# Patient Record
Sex: Female | Born: 1955
Health system: Southern US, Community
[De-identification: ages and names within clinical notes are randomized; demographics above are authoritative.]

## PROBLEM LIST (undated history)

## (undated) DIAGNOSIS — E039 Hypothyroidism, unspecified: Secondary | ICD-10-CM

## (undated) DIAGNOSIS — M810 Age-related osteoporosis without current pathological fracture: Secondary | ICD-10-CM

## (undated) DIAGNOSIS — K219 Gastro-esophageal reflux disease without esophagitis: Secondary | ICD-10-CM

## (undated) DIAGNOSIS — J45909 Unspecified asthma, uncomplicated: Secondary | ICD-10-CM

## (undated) DIAGNOSIS — K589 Irritable bowel syndrome without diarrhea: Secondary | ICD-10-CM

## (undated) DIAGNOSIS — J849 Interstitial pulmonary disease, unspecified: Secondary | ICD-10-CM

## (undated) DIAGNOSIS — M3500 Sicca syndrome, unspecified: Secondary | ICD-10-CM

## (undated) DIAGNOSIS — M329 Systemic lupus erythematosus, unspecified: Secondary | ICD-10-CM

## (undated) DIAGNOSIS — R87619 Unspecified abnormal cytological findings in specimens from cervix uteri: Secondary | ICD-10-CM

## (undated) DIAGNOSIS — E0500 Thyrotoxicosis with diffuse goiter without thyrotoxic crisis or storm: Secondary | ICD-10-CM

## (undated) DIAGNOSIS — R519 Headache, unspecified: Secondary | ICD-10-CM

## (undated) DIAGNOSIS — Z6830 Body mass index (BMI) 30.0-30.9, adult: Secondary | ICD-10-CM

## (undated) DIAGNOSIS — E785 Hyperlipidemia, unspecified: Secondary | ICD-10-CM

## (undated) DIAGNOSIS — I7300 Raynaud's syndrome without gangrene: Secondary | ICD-10-CM

## (undated) DIAGNOSIS — K900 Celiac disease: Secondary | ICD-10-CM

## (undated) DIAGNOSIS — K551 Chronic vascular disorders of intestine: Secondary | ICD-10-CM

## (undated) DIAGNOSIS — M797 Fibromyalgia: Secondary | ICD-10-CM

## (undated) DIAGNOSIS — E669 Obesity, unspecified: Secondary | ICD-10-CM

## (undated) DIAGNOSIS — F419 Anxiety disorder, unspecified: Secondary | ICD-10-CM

## (undated) DIAGNOSIS — I10 Essential (primary) hypertension: Secondary | ICD-10-CM

## (undated) DIAGNOSIS — T7840XA Allergy, unspecified, initial encounter: Secondary | ICD-10-CM

## (undated) HISTORY — PX: OTHER SURGICAL HISTORY: SHX169

## (undated) HISTORY — DX: Interstitial pulmonary disease, unspecified: J84.9

## (undated) HISTORY — DX: Allergy, unspecified, initial encounter: T78.40XA

## (undated) HISTORY — PX: REDUCTION MAMMAPLASTY: SUR839

## (undated) HISTORY — DX: Thyrotoxicosis with diffuse goiter without thyrotoxic crisis or storm: E05.00

## (undated) HISTORY — PX: EYE SURGERY: SHX253

## (undated) HISTORY — DX: Systemic lupus erythematosus, unspecified: M32.9

## (undated) HISTORY — DX: Sjogren syndrome, unspecified: M35.00

## (undated) HISTORY — DX: Raynaud's syndrome without gangrene: I73.00

## (undated) HISTORY — DX: Gastro-esophageal reflux disease without esophagitis: K21.9

## (undated) HISTORY — PX: APPENDECTOMY: SHX54

## (undated) HISTORY — PX: CHOLECYSTECTOMY: SHX55

## (undated) HISTORY — PX: DG THUMB RIGHT HAND (ARMC HX): HXRAD1825

---

## 2018-04-06 ENCOUNTER — Ambulatory Visit (HOSPITAL_COMMUNITY): Payer: Worker's Compensation | Attending: Family Medicine | Admitting: Physical Therapy

## 2018-04-06 DIAGNOSIS — M5416 Radiculopathy, lumbar region: Secondary | ICD-10-CM | POA: Diagnosis not present

## 2018-04-06 DIAGNOSIS — X58XXXA Exposure to other specified factors, initial encounter: Secondary | ICD-10-CM | POA: Insufficient documentation

## 2018-04-06 DIAGNOSIS — S39012A Strain of muscle, fascia and tendon of lower back, initial encounter: Secondary | ICD-10-CM | POA: Diagnosis not present

## 2018-04-06 DIAGNOSIS — M25651 Stiffness of right hip, not elsewhere classified: Secondary | ICD-10-CM | POA: Diagnosis not present

## 2018-04-06 DIAGNOSIS — M25652 Stiffness of left hip, not elsewhere classified: Secondary | ICD-10-CM | POA: Insufficient documentation

## 2018-04-06 DIAGNOSIS — S29012A Strain of muscle and tendon of back wall of thorax, initial encounter: Secondary | ICD-10-CM | POA: Diagnosis not present
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
## 2018-04-07 ENCOUNTER — Ambulatory Visit (HOSPITAL_COMMUNITY): Payer: Worker's Compensation

## 2018-04-07 ENCOUNTER — Encounter (HOSPITAL_COMMUNITY): Payer: Self-pay

## 2018-04-08 ENCOUNTER — Encounter (HOSPITAL_COMMUNITY): Payer: Self-pay | Admitting: Physical Therapy

## 2018-04-13 ENCOUNTER — Telehealth (HOSPITAL_COMMUNITY): Payer: Self-pay | Admitting: *Deleted

## 2018-04-13 NOTE — Telephone Encounter (Signed)
04/13/18  I called patient and gave her the appts listed and she was fine with all of them.

## 2018-04-19 ENCOUNTER — Ambulatory Visit (HOSPITAL_COMMUNITY): Payer: Worker's Compensation | Attending: Physical Therapy | Admitting: Physical Therapy

## 2018-04-19 ENCOUNTER — Encounter (HOSPITAL_COMMUNITY): Payer: Self-pay | Admitting: Physical Therapy

## 2018-04-19 DIAGNOSIS — M25652 Stiffness of left hip, not elsewhere classified: Secondary | ICD-10-CM | POA: Insufficient documentation

## 2018-04-19 DIAGNOSIS — M25651 Stiffness of right hip, not elsewhere classified: Secondary | ICD-10-CM | POA: Insufficient documentation

## 2018-04-19 DIAGNOSIS — M5416 Radiculopathy, lumbar region: Secondary | ICD-10-CM | POA: Insufficient documentation

## 2018-04-19 NOTE — Addendum Note (Signed)
Addended by: Bella Kennedy on: 04/19/2018 04:04 PM   Modules accepted: Orders

## 2018-04-19 NOTE — Therapy (Signed)
[facility] [address] [place], [place], [address] Phone: [phone]   Fax:  [phone]  Physical Therapy Treatment  Patient Details  Name: Katie Davis MRN: [id number] Date of Birth: 1955/12/03 Referring Provider (PT): Dayna Barker   Encounter Date: 04/19/2018  PT End of Session - 04/19/18 1206    Visit Number  2    Number of Visits  8    Date for PT Re-Evaluation  05/06/18    Authorization - Visit Number  2    Authorization - Number of Visits  8    PT Start Time  1036    PT Stop Time  1120    PT Time Calculation (min)  44 min    Activity Tolerance  Patient tolerated treatment well    Behavior During Therapy  [facility] for tasks assessed/performed       History reviewed. No pertinent past medical history.  History reviewed. No pertinent surgical history.  There were no vitals filed for this visit.  Subjective Assessment - 04/19/18 1035    Subjective  Pt states she started an aquatic program at the [facility] and has been doing her HEP and walking at home.  Currently 6/10 LBP.  States her thyroid issues are continuing as well as other issues she's battling.     Currently in Pain?  Yes    Pain Score  6     Pain Location  Back    Pain Orientation  Lower    Pain Descriptors / Indicators  Throbbing;Stabbing    Pain Type  Chronic pain                       [facility] Adult PT Treatment/Exercise - 04/19/18 0001      Lumbar Exercises: Stretches   Active Hamstring Stretch  Right;Left;2 reps;30 seconds    Active Hamstring Stretch Limitations  standing 12" step, supine with towel      Lumbar Exercises: Standing   Heel Raises  10 reps;Limitations    Heel Raises Limitations  toeraises 10 reps    Forward Lunge  10 reps;Limitations    Forward Lunge Limitations  4" step no UE's    Other Standing Lumbar Exercises  lumbar excursions 3 way 10 reps each       Lumbar Exercises: Seated   Other Seated Lumbar Exercises  sit tall with  scapular retraction, transverse abdominal activation x 10 .      Lumbar Exercises: Supine   Other Supine Lumbar Exercises  decompression 1-5,  5 reps each             PT Education - 04/19/18 1213    Education Details  reviewed goals, HEP and poc moving forward.  Spoke with aquatic therapist and educated on decompressoin therex    Person(s) Educated  Patient    Methods  Explanation    Comprehension  Verbalized understanding       PT Short Term Goals - 04/19/18 1214      PT SHORT TERM GOAL #1   Title  Pt to state that she is walking 10 minutes a day.    Time  2    Period  Weeks    Status  Achieved      PT SHORT TERM GOAL #2   Title  PT LE strength to increase to allow pt to be able to go up and down 5 steps in a reciprocal manner without increased pain.     Time  2    Period  Weeks    Status  On-going      PT SHORT TERM GOAL #3   Title  Pt to be able to put dishes and groceries up to shoulder or higher cabinets without increased pain .     Time  2    Period  Weeks    Status  On-going        PT Long Term Goals - 04/19/18 1214      PT LONG TERM GOAL #1   Title  Pt to be walking 20 minutes a day.    Time  4    Period  Weeks    Status  On-going      PT LONG TERM GOAL #2   Title  PT hip and back ROM to be improved to allow pt to pick up items off the floor with ease     Time  4    Period  Weeks    Status  On-going      PT LONG TERM GOAL #3   Title  Pt to be I in an advance exercise program to continue improving balance and core strength to be able to complete yard and housework without increased pain.     Time  4    Period  Weeks    Status  On-going            Plan - 04/19/18 1207    Clinical Impression Statement  Pt voiced interest in completing aquatics; discussed with Fleet Contras and patient put on waiting list.  REviewed goals, HEP and POC moving forward.   Began lumbar excursions, LE standing exercises and decompression exercises this session.  Pt able  to complete in overall good form and minimal cues.      Rehab Potential  Good    PT Frequency  2x / week    PT Duration  4 weeks    PT Treatment/Interventions  ADLs/Self Care Home Management;Functional mobility training;Therapeutic activities;Therapeutic exercise;Balance training;Neuromuscular re-education;Patient/family education;Manual techniques    PT Next Visit Plan  Next session begin thoracic excursions and postural theraband exercises.  Add reciprocal steps and begin to work on Actuary and upper cabinets progressing to lifting floor to waist.  Progress to decompression exercises with theraband.   Follow up on aquatics in a couple weeks for vacancies.  Initiate manual as needed to reduce pain.     PT Home Exercise Plan  tall sitting, scapular retraction and transverse abdominal activation.     Consulted and Agree with Plan of Care  Patient       Patient will benefit from skilled therapeutic intervention in order to improve the following deficits and impairments:  Decreased activity tolerance, Decreased balance, Decreased strength, Difficulty walking, Decreased range of motion, Pain  Visit Diagnosis: Stiffness of right hip, not elsewhere classified  Stiffness of left hip, not elsewhere classified  Radiculopathy, lumbar region     Problem List There are no active problems to display for this patient.  Lurena Nida, PTA/CLT [phone]  Lurena Nida 04/19/2018, 12:15 PM  [facility] [address] [place], [place], [address] Phone: [phone]   Fax:  [phone]  Name: Katie Davis MRN: [id number] Date of Birth: 05-03-1955

## 2018-04-21 ENCOUNTER — Ambulatory Visit (HOSPITAL_COMMUNITY): Payer: Worker's Compensation

## 2018-04-22 ENCOUNTER — Ambulatory Visit (HOSPITAL_COMMUNITY): Payer: Worker's Compensation | Admitting: Physical Therapy

## 2018-04-22 ENCOUNTER — Encounter (HOSPITAL_COMMUNITY): Payer: Self-pay | Admitting: Physical Therapy

## 2018-04-22 DIAGNOSIS — M25652 Stiffness of left hip, not elsewhere classified: Secondary | ICD-10-CM

## 2018-04-22 DIAGNOSIS — M5416 Radiculopathy, lumbar region: Secondary | ICD-10-CM | POA: Diagnosis not present

## 2018-04-22 DIAGNOSIS — M25651 Stiffness of right hip, not elsewhere classified: Secondary | ICD-10-CM

## 2018-04-22 NOTE — Therapy (Addendum)
[facility] [address] [place], [place], [address] Phone: [phone]   Fax:  [phone]  Physical Therapy Treatment  Patient Details  Name: Katie Davis MRN: [id number] Date of Birth: 11/07/1955 Referring Provider (PT): Dayna BarkerLee Mancini   Encounter Date: 04/22/2018  PT End of Session - 04/22/18 0851    Visit Number  3    Number of Visits  8    Date for PT Re-Evaluation  05/06/18    Authorization - Visit Number  3    Authorization - Number of Visits  8    PT Start Time  0820    PT Stop Time  0900    PT Time Calculation (min)  40 min    Activity Tolerance  Patient tolerated treatment well    Behavior During Therapy  [facility] for tasks assessed/performed       History reviewed. No pertinent past medical history.  History reviewed. No pertinent surgical history.  There were no vitals filed for this visit.  Subjective Assessment - 04/22/18 0825    Subjective  Pt states that she is doing her exercises at home.  She has no questions     Currently in Pain?  Yes    Pain Score  5     Pain Location  Back    Pain Orientation  Lower    Pain Descriptors / Indicators  Aching                       [facility] Adult PT Treatment/Exercise - 04/22/18 0001      Ambulation/Gait   Ambulation/Gait  Yes    Gait Comments  x4 minutes       Posture/Postural Control   Posture/Postural Control  Postural limitations    Postural Limitations  Forward head;Decreased lumbar lordosis      Exercises   Exercises  Lumbar      Lumbar Exercises: Stretches   Active Hamstring Stretch  Right;Left;3 reps;30 seconds    Single Knee to Chest Stretch  Right;Left;3 reps;30 seconds    Other Lumbar Stretch Exercise  thoracic and lumbar excursion x 3       Lumbar Exercises: Standing   Heel Raises  10 reps;Limitations    Functional Squats  10 reps    Forward Lunge  10 reps;Limitations    Forward Lunge Limitations  --    Scapular Retraction   Strengthening;Both;10 reps;Theraband    Theraband Level (Scapular Retraction)  Level 2 (Red)    Row  Strengthening;Both;10 reps;Theraband    Theraband Level (Row)  Level 2 (Red)    Shoulder Extension  Strengthening;10 reps;Theraband    Theraband Level (Shoulder Extension)  Level 2 (Red)      Lumbar Exercises: Supine   Bridge  5 reps    Bridge Limitations  10"      Lumbar Exercises: Sidelying   Hip Abduction  10 reps      vector stances 5" x 3 B          PT Short Term Goals - 04/22/18 0853      PT SHORT TERM GOAL #1   Title  Pt to state that she is walking 10 minutes a day.    Time  2    Period  Weeks    Status  Achieved      PT SHORT TERM GOAL #2   Title  PT LE strength to increase to allow pt to be able to go up and down  5 steps in a reciprocal manner without increased pain.     Time  2    Period  Weeks    Status  On-going      PT SHORT TERM GOAL #3   Title  Pt to be able to put dishes and groceries up to shoulder or higher cabinets without increased pain .     Time  2    Period  Weeks    Status  On-going        PT Long Term Goals - 04/19/18 1214      PT LONG TERM GOAL #1   Title  Pt to be walking 20 minutes a day.    Time  4    Period  Weeks    Status  On-going      PT LONG TERM GOAL #2   Title  PT hip and back ROM to be improved to allow pt to pick up items off the floor with ease     Time  4    Period  Weeks    Status  On-going      PT LONG TERM GOAL #3   Title  Pt to be I in an advance exercise program to continue improving balance and core strength to be able to complete yard and housework without increased pain.     Time  4    Period  Weeks    Status  On-going            Plan - 04/22/18 6629    Clinical Impression Statement  Added postural t band and thoracic excursion with good technique.  Began stretching at end of session as well.  Pt needs minimal cuing to perform exercises in correct technique.    Rehab Potential  Good    PT  Frequency  2x / week    PT Duration  4 weeks    PT Treatment/Interventions  ADLs/Self Care Home Management;Functional mobility training;Therapeutic activities;Therapeutic exercise;Balance training;Neuromuscular re-education;Patient/family education;Manual techniques    PT Next Visit Plan     Add reciprocal steps and begin to work on body mechanics floor retrieval and upper cabinets progressing to lifting floor to waist. Give t-band for postural exercises for HEP if pt has good form.  Initiate manual as needed to reduce pain.     PT Home Exercise Plan  tall sitting, scapular retraction and transverse abdominal activation.   thoracic and 3 D Excursions, walking     Consulted and Agree with Plan of Care  Patient       Patient will benefit from skilled therapeutic intervention in order to improve the following deficits and impairments:  Decreased activity tolerance, Decreased balance, Decreased strength, Difficulty walking, Decreased range of motion, Pain  Visit Diagnosis: Stiffness of right hip, not elsewhere classified  Stiffness of left hip, not elsewhere classified  Radiculopathy, lumbar region     Problem List There are no active problems to display for this patient.   Virgina Organ, PT CLT [phone] 04/22/2018, 9:06 AM  [facility] [address] [place], [place], [address] Phone: [phone]   Fax:  [phone]  Name: Katie Davis MRN: [id number] Date of Birth: 08-10-1955

## 2018-04-26 ENCOUNTER — Ambulatory Visit (HOSPITAL_COMMUNITY): Payer: Worker's Compensation | Admitting: Physical Therapy

## 2018-04-26 DIAGNOSIS — M25652 Stiffness of left hip, not elsewhere classified: Secondary | ICD-10-CM

## 2018-04-26 DIAGNOSIS — M25651 Stiffness of right hip, not elsewhere classified: Secondary | ICD-10-CM

## 2018-04-26 DIAGNOSIS — M5416 Radiculopathy, lumbar region: Secondary | ICD-10-CM

## 2018-04-26 NOTE — Therapy (Signed)
[facility] [address] [place], [place], [address] Phone: [phone]   Fax:  [phone]  Physical Therapy Treatment  Patient Details  Name: Katie Davis MRN: [id number] Date of Birth: 1955/10/31 Referring Provider (PT): Dayna Barker   Encounter Date: 04/26/2018  PT End of Session - 04/26/18 1841    Visit Number  4    Number of Visits  8    Date for PT Re-Evaluation  05/06/18    Authorization - Visit Number  4    Authorization - Number of Visits  8    PT Start Time  1035    PT Stop Time  1125    PT Time Calculation (min)  50 min    Activity Tolerance  Patient tolerated treatment well    Behavior During Therapy  [facility] for tasks assessed/performed       No past medical history on file.  No past surgical history on file.  There were no vitals filed for this visit.  Subjective Assessment - 04/26/18 1037    Subjective  pt states she feels they did too much last visit and was hurting worse and sore since.  Currently 7/10 across lower back.     Currently in Pain?  Yes    Pain Score  7     Pain Location  Back    Pain Descriptors / Indicators  Aching    Pain Type  Chronic pain                       [facility] Adult PT Treatment/Exercise - 04/26/18 0001      Lumbar Exercises: Standing   Heel Raises  10 reps;Limitations    Heel Raises Limitations  toeraises 10 reps    Functional Squats  10 reps    Forward Lunge  10 reps;Limitations    Forward Lunge Limitations  4" step no UE's    Scapular Retraction  Strengthening;Both;10 reps;Theraband    Theraband Level (Scapular Retraction)  Level 2 (Red)    Row  Strengthening;Both;10 reps;Theraband    Theraband Level (Row)  Level 2 (Red)    Shoulder Extension  Strengthening;10 reps;Theraband    Theraband Level (Shoulder Extension)  Level 2 (Red)             PT Education - 04/26/18 1812    Education Details  issues regarding therapy and POC moving forward discussed with  therapist and rehab manager this session.     Person(s) Educated  Patient    Methods  Explanation    Comprehension  Verbalized understanding       PT Short Term Goals - 04/22/18 0853      PT SHORT TERM GOAL #1   Title  Pt to state that she is walking 10 minutes a day.    Time  2    Period  Weeks    Status  Achieved      PT SHORT TERM GOAL #2   Title  PT LE strength to increase to allow pt to be able to go up and down 5 steps in a reciprocal manner without increased pain.     Time  2    Period  Weeks    Status  On-going      PT SHORT TERM GOAL #3   Title  Pt to be able to put dishes and groceries up to shoulder or higher cabinets without increased pain .     Time  2  Period  Weeks    Status  On-going        PT Long Term Goals - 04/19/18 1214      PT LONG TERM GOAL #1   Title  Pt to be walking 20 minutes a day.    Time  4    Period  Weeks    Status  On-going      PT LONG TERM GOAL #2   Title  PT hip and back ROM to be improved to allow pt to pick up items off the floor with ease     Time  4    Period  Weeks    Status  On-going      PT LONG TERM GOAL #3   Title  Pt to be I in an advance exercise program to continue improving balance and core strength to be able to complete yard and housework without increased pain.     Time  4    Period  Weeks    Status  On-going            Plan - 04/26/18 1826    Clinical Impression Statement  completed therex this session as established with some complaints of discomfort and cues for form.  Pt became emotional during session regarding previous session and was able to discuss this with her and the rehab manager.  Pt was providied a copy of her initial evaluation per request.  Manual completed to lower back at end of session with hypersensitivity noted, Lt>Rt.  Generalized tightness into bilateral gluteal mm.      Rehab Potential  Good    PT Frequency  2x / week    PT Duration  4 weeks    PT Treatment/Interventions   ADLs/Self Care Home Management;Functional mobility training;Therapeutic activities;Therapeutic exercise;Balance training;Neuromuscular re-education;Patient/family education;Manual techniques    PT Next Visit Plan  Add reciprocal steps and [place] to work on body mechanics (floor retrieval and upper cabinets) as able per symptoms/soreness.  follow up on aquatics.  Review and give theraband for postural exercises when pt has good form.  Continue manual as needed to reduce pain.     PT Home Exercise Plan  tall sitting, scapular retraction and transverse abdominal activation.   thoracic and 3 D Excursions, walking     Consulted and Agree with Plan of Care  Patient       Patient will benefit from skilled therapeutic intervention in order to improve the following deficits and impairments:  Decreased activity tolerance, Decreased balance, Decreased strength, Difficulty walking, Decreased range of motion, Pain  Visit Diagnosis: Stiffness of right hip, not elsewhere classified  Stiffness of left hip, not elsewhere classified  Radiculopathy, lumbar region     Problem List There are no active problems to display for this patient.  Lurena Nida, PTA/CLT [phone]  Lurena Nida 04/26/2018, 6:42 PM  [facility] [address] [place], [place], [address] Phone: [phone]   Fax:  [phone]  Name: Katie Davis MRN: [id number] Date of Birth: [birth year]

## 2018-04-28 ENCOUNTER — Encounter (HOSPITAL_COMMUNITY): Payer: Self-pay

## 2018-04-28 ENCOUNTER — Other Ambulatory Visit: Payer: Self-pay

## 2018-04-28 ENCOUNTER — Ambulatory Visit (HOSPITAL_COMMUNITY): Payer: Worker's Compensation | Attending: Family Medicine

## 2018-04-28 DIAGNOSIS — M5416 Radiculopathy, lumbar region: Secondary | ICD-10-CM

## 2018-04-28 DIAGNOSIS — M25652 Stiffness of left hip, not elsewhere classified: Secondary | ICD-10-CM | POA: Insufficient documentation

## 2018-04-28 DIAGNOSIS — M25651 Stiffness of right hip, not elsewhere classified: Secondary | ICD-10-CM | POA: Insufficient documentation

## 2018-04-28 NOTE — Therapy (Signed)
[facility] [address] [place], [place], [address] Phone: [phone]   Fax:  [phone]  Physical Therapy Treatment  Patient Details  Name: Katie Davis MRN: [id number] Date of Birth: 10/14/1955 Referring Provider (PT): Dayna Barker   Encounter Date: 04/28/2018  PT End of Session - 04/28/18 0956    Visit Number  5    Number of Visits  8    Date for PT Re-Evaluation  05/06/18    Authorization - Visit Number  5    Authorization - Number of Visits  8    PT Start Time  0947    PT Stop Time  1032    PT Time Calculation (min)  45 min    Activity Tolerance  Patient tolerated treatment well    Behavior During Therapy  [facility] for tasks assessed/performed       History reviewed. No pertinent past medical history.  History reviewed. No pertinent surgical history.  There were no vitals filed for this visit.  Subjective Assessment - 04/28/18 0949    Subjective  Patient reports she has not been having problems with her upper back but today she is having a headache and a "wavy" feeling. She states she has had vertigo before and describes a suboccipital release, stating that used to help relieve her headache when she did PT previously. She reports her back pain is a little lower than her last visit but it is still there and she feels like her fibromyalgia pain is flared up.    Patient Stated Goals  to be able to walk longer, exercise,     Currently in Pain?  Yes    Pain Score  6     Pain Location  Back    Pain Orientation  Lower    Pain Descriptors / Indicators  Aching    Pain Type  Chronic pain    Pain Onset  More than a month ago    Pain Frequency  Constant    Aggravating Factors   siting or laying on hard surfaces    Pain Relieving Factors  sleeping/pain patches    Effect of Pain on Daily Activities  limited        [facility] Adult PT Treatment/Exercise - 04/28/18 0001      Lumbar Exercises: Standing   Heel Raises  15 reps;3 seconds    Heel Raises Limitations  1x 15 reps toe raises, 3 sec holds, on decline    Functional Squats  10 reps    Functional Squats Limitations  2 sets, chair taps    Other Standing Lumbar Exercises  Hip Extension: 10 reps bil LE, red theraband      Lumbar Exercises: Supine   Other Supine Lumbar Exercises  Decompression level 1: shoulder press, head press, leg lengthener, leg press,  10 reps each; 5 sec holds      Manual Therapy   Manual Therapy  Myofascial release;Manual Traction    Manual therapy comments  performed seperate from other interventions    Myofascial Release  3x 30 seconds myofascial release for bil suboocipitals    Manual Traction  3x 30-45 seconds of manual traction with mobilization belt to lumbar spine in supine        PT Education - 04/28/18 0956    Education Details  Edcuated on exercise throughout. She was educated on aquatic therapy and that she is on the waitlist currently.     Person(s) Educated  Patient    Methods  Explanation    Comprehension  Verbalized understanding       PT Short Term Goals - 04/22/18 0853      PT SHORT TERM GOAL #1   Title  Pt to state that she is walking 10 minutes a day.    Time  2    Period  Weeks    Status  Achieved      PT SHORT TERM GOAL #2   Title  PT LE strength to increase to allow pt to be able to go up and down 5 steps in a reciprocal manner without increased pain.     Time  2    Period  Weeks    Status  On-going      PT SHORT TERM GOAL #3   Title  Pt to be able to put dishes and groceries up to shoulder or higher cabinets without increased pain .     Time  2    Period  Weeks    Status  On-going        PT Long Term Goals - 04/19/18 1214      PT LONG TERM GOAL #1   Title  Pt to be walking 20 minutes a day.    Time  4    Period  Weeks    Status  On-going      PT LONG TERM GOAL #2   Title  PT hip and back ROM to be improved to allow pt to pick up items off the floor with ease     Time  4    Period  Weeks     Status  On-going      PT LONG TERM GOAL #3   Title  Pt to be I in an advance exercise program to continue improving balance and core strength to be able to complete yard and housework without increased pain.     Time  4    Period  Weeks    Status  On-going        Plan - 04/28/18 1205    Clinical Impression Statement  Patient continued with lumbar decompression exercises to reduce strain in low back. She denied pain during exercises but reported she could feel that her Rt leg was not as strong as her Lt. She initiated standing hip extension today with minimal cuing for correct form and continued with functional squats. Manual interventions performed at EOS for pain relief to lumbar spine and to reduce headache. Patient reported reduced pain following manual traction to lumbar spine and reported feeling more relaxed with each repetition. Suboccitpital release performed to reduce headache and patient reported much improved at EOS. She was provided handout on aquatic therapy and what to expect for when we find an opening for her. She will benefit from continued skilled PT interventions to address impairments and progress function to improve QOL.    Rehab Potential  Good    PT Frequency  2x / week    PT Duration  4 weeks    PT Treatment/Interventions  ADLs/Self Care Home Management;Functional mobility training;Therapeutic activities;Therapeutic exercise;Balance training;Neuromuscular re-education;Patient/family education;Manual techniques;Aquatic Therapy    PT Next Visit Plan  Add reciprocal steps and [place] to work on body mechanics (floor retrieval and upper cabinets) as able per symptoms/soreness.  Review and give theraband for postural exercises when pt has good form. Continue with manual for pain relief PRN (pt responds well to manual lumbar traction)    PT Home Exercise Plan  tall sitting, scapular  retraction and transverse abdominal activation.   thoracic and 3 D Excursions, walking      Consulted and Agree with Plan of Care  Patient       Patient will benefit from skilled therapeutic intervention in order to improve the following deficits and impairments:  Decreased activity tolerance, Decreased balance, Decreased strength, Difficulty walking, Decreased range of motion, Pain  Visit Diagnosis: Stiffness of right hip, not elsewhere classified  Stiffness of left hip, not elsewhere classified  Radiculopathy, lumbar region     Problem List There are no active problems to display for this patient.   Valentino Saxonachel Quinn-Brown, PT, DPT, [facility] Physical Therapist with [facility]  04/28/2018 12:12 PM    [facility] [address] [place], [place], [address] Phone: [phone]   Fax:  [phone]  Name: Doloris Hallatricia Velazco MRN: [id number] Date of Birth: 01/01/1956

## 2018-05-03 ENCOUNTER — Ambulatory Visit (HOSPITAL_COMMUNITY): Payer: Worker's Compensation | Admitting: Physical Therapy

## 2018-05-03 DIAGNOSIS — M25651 Stiffness of right hip, not elsewhere classified: Secondary | ICD-10-CM

## 2018-05-03 DIAGNOSIS — M5416 Radiculopathy, lumbar region: Secondary | ICD-10-CM

## 2018-05-03 DIAGNOSIS — M25652 Stiffness of left hip, not elsewhere classified: Secondary | ICD-10-CM

## 2018-05-04 NOTE — Therapy (Signed)
[facility] [address] [place], [place], [address] Phone: [phone]   Fax:  [phone]  Physical Therapy Treatment  Patient Details  Name: Katie Davis MRN: [id number] Date of Birth: 07/12/1955 Referring Provider (PT): Dayna Barker   Encounter Date: 05/03/2018  PT End of Session - 05/03/18 0811    Visit Number  6    Number of Visits  8    Date for PT Re-Evaluation  05/06/18    Authorization - Visit Number  6    Authorization - Number of Visits  8    PT Start Time  1350    PT Stop Time  1438    PT Time Calculation (min)  48 min    Activity Tolerance  Patient tolerated treatment well    Behavior During Therapy  [facility] for tasks assessed/performed       No past medical history on file.  No past surgical history on file.  There were no vitals filed for this visit.  Subjective Assessment - 05/03/18 1354    Subjective  Pt states the traction really helped last session and lasted into the evening.  Still having headaches. Currenlty 5/10 in lumbar region.     Currently in Pain?  Yes    Pain Score  5     Pain Location  Back    Pain Orientation  Lower    Pain Descriptors / Indicators  Aching    Pain Type  Chronic pain             05/03/18 0001  Lumbar Exercises: Standing  Heel Raises 15 reps;3 seconds  Heel Raises Limitations 1x 15 reps toe raises, 3 sec holds, on decline  Forward Lunge 15 reps;Limitations  Forward Lunge Limitations 4" step no UE's  Lumbar Exercises: Supine  Other Supine Lumbar Exercises Decompression level 1: shoulder press, head press, leg lengthener, leg press,  10 reps each; 5 sec holds  Other Supine Lumbar Exercises decompression with RTB 5 reps each  Manual Therapy  Manual Therapy Manual Traction  Manual therapy comments performed seperate from other interventions  Manual Traction 3x 30-45 seconds of manual traction with mobilization belt to lumbar spine in supine       Assessment:  Pt returns  today, overall improved with positive results from traction.  Completed traction again at end of session with some immediate discomfort voiced, however reports this as normal following.  Added decompression exercises with theraband this session and given instructions for home.  Pt able to demonstrate correctly with minimal cues needed.  Also increased reps of lunges with cues for form.  Plan:  Continue core strengthening, decompression and manual to help reduce overall symtpoms.                   PT Short Term Goals - 04/22/18 0853      PT SHORT TERM GOAL #1   Title  Pt to state that she is walking 10 minutes a day.    Time  2    Period  Weeks    Status  Achieved      PT SHORT TERM GOAL #2   Title  PT LE strength to increase to allow pt to be able to go up and down 5 steps in a reciprocal manner without increased pain.     Time  2    Period  Weeks    Status  On-going      PT SHORT TERM GOAL #3  Title  Pt to be able to put dishes and groceries up to shoulder or higher cabinets without increased pain .     Time  2    Period  Weeks    Status  On-going        PT Long Term Goals - 04/19/18 1214      PT LONG TERM GOAL #1   Title  Pt to be walking 20 minutes a day.    Time  4    Period  Weeks    Status  On-going      PT LONG TERM GOAL #2   Title  PT hip and back ROM to be improved to allow pt to pick up items off the floor with ease     Time  4    Period  Weeks    Status  On-going      PT LONG TERM GOAL #3   Title  Pt to be I in an advance exercise program to continue improving balance and core strength to be able to complete yard and housework without increased pain.     Time  4    Period  Weeks    Status  On-going              Patient will benefit from skilled therapeutic intervention in order to improve the following deficits and impairments:     Visit Diagnosis: No diagnosis found.     Problem List There are no active problems to display  for this patient.  Lurena Nida, PTA/CLT [phone]  Lurena Nida 05/04/2018, 8:15 AM  [facility] [address] [place], [place], [address] Phone: [phone]   Fax:  [phone]  Name: Katie Davis MRN: [id number] Date of Birth: [birth year]

## 2018-05-05 ENCOUNTER — Ambulatory Visit (HOSPITAL_COMMUNITY): Payer: Worker's Compensation | Attending: Family Medicine

## 2018-05-05 ENCOUNTER — Encounter (HOSPITAL_COMMUNITY): Payer: Self-pay

## 2018-05-05 ENCOUNTER — Other Ambulatory Visit: Payer: Self-pay

## 2018-05-05 DIAGNOSIS — M25652 Stiffness of left hip, not elsewhere classified: Secondary | ICD-10-CM | POA: Diagnosis present

## 2018-05-05 DIAGNOSIS — M25651 Stiffness of right hip, not elsewhere classified: Secondary | ICD-10-CM

## 2018-05-05 DIAGNOSIS — M5416 Radiculopathy, lumbar region: Secondary | ICD-10-CM | POA: Diagnosis present

## 2018-05-05 NOTE — Therapy (Signed)
[facility] [address] [place], [place], [address] Phone: [phone]   Fax:  [phone]  Physical Therapy Treatment  Patient Details  Name: Katie Hallatricia Hagarty MRN: [id number] Date of Birth: 11/26/1955 Referring Provider (PT): Dayna BarkerLee Mancini   Encounter Date: 05/05/2018  PT End of Session - 05/05/18 1050    Visit Number  7    Number of Visits  8    Date for PT Re-Evaluation  05/06/18    Authorization - Visit Number  7    Authorization - Number of Visits  8    PT Start Time  1038   pt late   PT Stop Time  1117    PT Time Calculation (min)  39 min    Activity Tolerance  Patient tolerated treatment well    Behavior During Therapy  [facility] for tasks assessed/performed       History reviewed. No pertinent past medical history.  History reviewed. No pertinent surgical history.  There were no vitals filed for this visit.  Subjective Assessment - 05/05/18 1041    Subjective  Patient reports she is doing well today and that her back is not hurting as badly. She has a slight headache, about 5/10. She felt a little sore after her last session but she reports the pulling really helps her pain.  Patient has noticed her pain is not waking her up as much at night. She reports it still takes her a few minutes to loosen up in the morning but it is not as bad as it was before.      Patient Stated Goals  to be able to walk longer, exercise,     Currently in Pain?  Yes    Pain Score  4     Pain Location  Back    Pain Orientation  Lower    Pain Descriptors / Indicators  Aching    Pain Type  Chronic pain    Pain Onset  More than a month ago    Pain Frequency  Constant    Aggravating Factors   sitting or laying on hard surfaces    Pain Relieving Factors  rest, ibuprofen    Effect of Pain on Daily Activities  limits    Multiple Pain Sites  Yes    Pain Score  5    Pain Location  Head    Pain Orientation  Anterior    Pain Descriptors / Indicators  Aching     Pain Type  Chronic pain    Pain Onset  More than a month ago    Pain Frequency  Intermittent    Aggravating Factors   unsure    Pain Relieving Factors  manual therapy    Effect of Pain on Daily Activities  moderate        [facility] Adult PT Treatment/Exercise - 05/05/18 0001      Exercises   Exercises  Lumbar      Lumbar Exercises: Standing   Heel Raises  3 seconds;20 reps    Heel Raises Limitations  1x 20 reps toe raises, 3 sec holds, on decline    Forward Lunge  10 reps;Limitations    Forward Lunge Limitations  4" step, 2 sets    Scapular Retraction  Strengthening;Both;10 reps;Theraband    Theraband Level (Scapular Retraction)  Level 2 (Red)    Scapular Retraction Limitations  high row    Row  Strengthening;Both;10 reps;Theraband    Theraband Level (Row)  Level 2 (Red)  Shoulder Extension  Strengthening;10 reps;Theraband    Theraband Level (Shoulder Extension)  Level 2 (Red)    Other Standing Lumbar Exercises  Hip Extension and Abduction: 2x 10 reps bil LE      Manual Therapy   Manual Therapy  Manual Traction;Myofascial release    Manual therapy comments  performed seperate from other interventions    Myofascial Release  3x 30 seconds myofascial release for bil suboocipitals    Manual Traction  3x 30-45 seconds of manual traction with mobilization belt to lumbar spine in supine       PT Education - 05/05/18 1049    Education Details  Edcuated on exercises throughout and plan to update HEP next session.    Person(s) Educated  Patient    Methods  Explanation    Comprehension  Verbalized understanding       PT Short Term Goals - 04/22/18 0853      PT SHORT TERM GOAL #1   Title  Pt to state that she is walking 10 minutes a day.    Time  2    Period  Weeks    Status  Achieved      PT SHORT TERM GOAL #2   Title  PT LE strength to increase to allow pt to be able to go up and down 5 steps in a reciprocal manner without increased pain.     Time  2    Period  Weeks     Status  On-going      PT SHORT TERM GOAL #3   Title  Pt to be able to put dishes and groceries up to shoulder or higher cabinets without increased pain .     Time  2    Period  Weeks    Status  On-going        PT Long Term Goals - 04/19/18 1214      PT LONG TERM GOAL #1   Title  Pt to be walking 20 minutes a day.    Time  4    Period  Weeks    Status  On-going      PT LONG TERM GOAL #2   Title  PT hip and back ROM to be improved to allow pt to pick up items off the floor with ease     Time  4    Period  Weeks    Status  On-going      PT LONG TERM GOAL #3   Title  Pt to be I in an advance exercise program to continue improving balance and core strength to be able to complete yard and housework without increased pain.     Time  4    Period  Weeks    Status  On-going        Plan - 05/05/18 1050    Clinical Impression Statement  Continued with functional LE and postural strengthening. Patient demonstrated good form with lunges; she required tactile cues for form initially but improved with repetition. She performed theraband strengthening today with good form, no cues required. EOS performed manual traction to lumbar spine as patient has reported positive response and reduction in symptoms into her Bil LE. Suboccipital release performed as well for headaches. She will benefit from continued skilled PT interventions to address impairments and progress function to improve QOL.    Rehab Potential  Good    PT Frequency  2x / week    PT Duration  4 weeks  PT Treatment/Interventions  ADLs/Self Care Home Management;Functional mobility training;Therapeutic activities;Therapeutic exercise;Balance training;Neuromuscular re-education;Patient/family education;Manual techniques;Aquatic Therapy    PT Next Visit Plan  Add postural TB exercise to HEP. Add reciprocal steps and progress to work on Estate manager/land agent (floor retrieval and upper cabinets) as able per symptoms/soreness.  Continue with  manual for pain relief PRN (pt responds well to manual lumbar traction)    PT Home Exercise Plan  tall sitting, scapular retraction and transverse abdominal activation.   thoracic and 3 D Excursions, walking     Consulted and Agree with Plan of Care  Patient       Patient will benefit from skilled therapeutic intervention in order to improve the following deficits and impairments:  Decreased activity tolerance, Decreased balance, Decreased strength, Difficulty walking, Decreased range of motion, Pain  Visit Diagnosis: Stiffness of right hip, not elsewhere classified  Stiffness of left hip, not elsewhere classified  Radiculopathy, lumbar region     Problem List There are no active problems to display for this patient.   Valentino Saxon, PT, DPT, [facility] Physical Therapist with [facility]  05/05/2018 12:06 PM    [facility] [address] [place], [place], [address] Phone: [phone]   Fax:  [phone]  Name: Adin Elsberry MRN: [id number] Date of Birth: [birth year]

## 2018-05-09 ENCOUNTER — Telehealth (HOSPITAL_COMMUNITY): Payer: Self-pay

## 2018-05-09 NOTE — Telephone Encounter (Signed)
I called Katie Davis to offer her an aquatic therapy appointment for this afternoon as an opening has become available. I left a voice message offering a 4:00 PM appointment today at the [facility] for aquatics and asked that she call back if she would prefer to come in for this rather than her 10:30 appointment for tomorrow morning.   Valentino Saxon, PT, DPT, [facility] Physical Therapist with [facility]  05/09/2018 10:00 AM

## 2018-05-10 ENCOUNTER — Encounter (HOSPITAL_COMMUNITY): Payer: Self-pay | Admitting: Physical Therapy

## 2018-05-10 ENCOUNTER — Telehealth (HOSPITAL_COMMUNITY): Payer: Self-pay

## 2018-05-10 ENCOUNTER — Ambulatory Visit (HOSPITAL_COMMUNITY): Payer: Worker's Compensation | Admitting: Physical Therapy

## 2018-05-10 NOTE — Telephone Encounter (Signed)
I called Ms. Edward Jolly regarding her 10:30 AM appointment for today as it falls outside of her approved window for the 8 workman's comp sessions and would have to be a self pay visit. I informed her she had been approved for 8 visits form 04/06/18-05/06/18 and that she would need to get more visits approved if she did not want to come in as a self pay session. She said she would like to call to request more visits as she does not have any other insurance and she cannot afford self pay at this time. I informed her we will cancel today and with keep Thursday's appointment for now in case she is able to get approval.   Valentino Saxon, PT, DPT, [facility] Physical Therapist with [facility]  05/10/2018 9:04 AM

## 2018-05-10 NOTE — Telephone Encounter (Signed)
Katie Davis returned our call after speaking with her insurance company. She stated our office will need to submit for more visits with her workman's comp case manager, Helmut Muster, and that it typically takes 24-48 hours. She would like to cancel all of this weeks appointments and request more visits for starting next week.  Valentino Saxon, PT, DPT, [facility] Physical Therapist with [facility]  05/10/2018 9:09 AM

## 2018-05-12 ENCOUNTER — Ambulatory Visit (HOSPITAL_COMMUNITY): Payer: Worker's Compensation | Admitting: Physical Therapy

## 2018-05-12 ENCOUNTER — Telehealth (HOSPITAL_COMMUNITY): Payer: Self-pay

## 2018-05-12 NOTE — Telephone Encounter (Signed)
L/m [phone] for MaryAnnP to request 8 more visit from worker's comp [employer]. She should call me back tomrrow 05/12/2018. NF

## 2018-05-13 ENCOUNTER — Telehealth (HOSPITAL_COMMUNITY): Payer: Self-pay

## 2018-05-13 NOTE — Telephone Encounter (Signed)
Called and Faxed Rep [place] [employer] request to approve 8 more visits on Patient's [id number] . [employer] called back and said they would review req once fax came in. NF 05/13/2018

## 2018-05-17 ENCOUNTER — Telehealth (HOSPITAL_COMMUNITY): Payer: Self-pay

## 2018-05-17 NOTE — Telephone Encounter (Signed)
Rep called back Rep called back states she will wait for Re-eval on 05/19/2018 for review of measurements. If pt is seen before we get approval she will Retro as long as they meet Medical necessity. NF 05/17/2018

## 2018-05-19 ENCOUNTER — Encounter (HOSPITAL_COMMUNITY): Payer: Self-pay

## 2018-05-19 ENCOUNTER — Ambulatory Visit (HOSPITAL_COMMUNITY): Payer: Worker's Compensation | Attending: Family Medicine

## 2018-05-19 DIAGNOSIS — M5416 Radiculopathy, lumbar region: Secondary | ICD-10-CM | POA: Diagnosis present

## 2018-05-19 DIAGNOSIS — M25652 Stiffness of left hip, not elsewhere classified: Secondary | ICD-10-CM | POA: Diagnosis present

## 2018-05-19 DIAGNOSIS — M25651 Stiffness of right hip, not elsewhere classified: Secondary | ICD-10-CM | POA: Diagnosis not present

## 2018-05-19 NOTE — Therapy (Signed)
[place], [place], [address] Phone: [phone]   Fax:  [phone]  Physical Therapy Re-Evaluation  Patient Details  Name: Katie Davis MRN: [id number] Date of Birth: May 21, 1955 Referring Provider (PT): Hiram Comber   Encounter Date: 05/19/2018  PT End of Session - 05/19/18 1113    Visit Number  1    Number of Visits  12    Date for PT Re-Evaluation  05/06/18    Authorization Type  Workmans Comp (8 visits approved 1-22 - 05-06-18 (Approval # [id number]))    Authorization Time Period  04/06/2018-05/05/2018; New 05/19/2018-06/30/2018    Authorization - Visit Number  --    Authorization - Number of Visits  --    PT Start Time  8101    PT Stop Time  1115    PT Time Calculation (min)  37 min    Activity Tolerance  Patient tolerated treatment well    Behavior During Therapy  [facility] for tasks assessed/performed       History reviewed. No pertinent past medical history.  History reviewed. No pertinent surgical history.  There were no vitals filed for this visit.  Subjective Assessment - 05/19/18 1040    Subjective  Patient states she is doing alright today and is having about 5/10 pain in her lower back. She reports some days she thinks a muscle relaxer would take off some of the edge off but she is only using ibuprofen for pain currently. She is also using heat and ice intermittently and reports her HEP stretches helps sometimes as well.  She has found a massage therapist and plans to start with her next week to work on her soft tissue restrictions as this used to help when she was in [place].    How long can you sit comfortably?  sometimes limited to 5-30 minutes; hard surfaces are worse    How long can you walk comfortably?  walking 15 minutes about 4 days a week     Patient Stated Goals  to be able to walk longer, exercise,     Currently in Pain?  Yes    Pain Score  5     Pain Location  Hip    Pain Orientation   Lower    Pain Descriptors / Indicators  Aching    Pain Type  Chronic pain    Pain Onset  More than a month ago    Pain Frequency  Constant   fluctuates   Aggravating Factors   sittng/laying on hard surfaces    Pain Relieving Factors  rest    Effect of Pain on Daily Activities  moderate limitation    Pain Onset  More than a month ago         [facility] PT Assessment - 05/19/18 0001      Assessment   Medical Diagnosis  thoracic/lumbar strain     Referring Provider (PT)  Hiram Comber    Onset Date/Surgical Date  07/07/17    Next MD Visit  unsure    Prior Therapy  yes in [place] prior to moving to [facility] in November 2019      Precautions   Precautions  None      Restrictions   Weight Bearing Restrictions  No      Balance Screen   Has the patient fallen in the past 6 months  No    Has the patient had a decrease in activity level because of a fear of falling?  No    Is the patient reluctant to leave their home because of a fear of falling?   No      Home Film/video editor residence    Home Access  Stairs to enter    Entrance Stairs-Number of Steps  4    Entrance Stairs-Rails  Right      Prior Function   Level of Independence  Independent      Single Leg Stance   Comments  Rt = 11; Lt = 19   Rt =6; Lt = 10 on 04/06/18     AROM   Lumbar Flexion  45    Lumbar Extension  15    Lumbar - Right Side Bend  15    Lumbar - Left Side Bend  15    Lumbar - Right Rotation  50% limited    Lumbar - Left Rotation  75% limited      Strength   Right Hip Flexion  4+/5    Right Hip Extension  4+/5    Right Hip ABduction  4+/5    Left Hip Flexion  4/5    Left Hip Extension  4+/5    Left Hip ABduction  4/5    Right Knee Flexion  4/5    Right Knee Extension  4/5    Left Knee Flexion  4+/5    Left Knee Extension  4+/5    Right Ankle Dorsiflexion  4+/5    Left Ankle Dorsiflexion  4+/5      Transfers   Five time sit to stand comments   19.2 no UE use       Ambulation/Gait   Stairs  Yes    Stairs Assistance  7: Independent    Stair Management Technique  One rail Right;Two rails;Alternating pattern;Forwards    Number of Stairs  8    Height of Stairs  6    Gait Comments  Patient unsteady with stair ambulation and alternating with 2 hand rails vs 1 hand rail.         PT Education - 05/19/18 1112    Education Details  Educated on improvements since evaluaiton and on remaining impairments for Lumbar mobility and weakness. Educated on plan to begin aquatics if approved for visits and encouraged patient to participate in massage therapy sessions with liscensed massage therapist.    Person(s) Educated  Patient    Methods  Explanation    Comprehension  Verbalized understanding       PT Short Term Goals - 05/19/18 1100      PT SHORT TERM GOAL #1   Title  Pt to state that she is walking 10 minutes a day.    Baseline  pt is walking 15 minutes 3-4x/week    Time  2    Period  Weeks    Status  Achieved      PT SHORT TERM GOAL #2   Title  PT LE strength to increase to allow pt to be able to go up and down 5 steps in a reciprocal manner without increased pain.     Baseline  pt reports she is able to go up/down more easily with 1 railng    Time  2    Period  Weeks    Status  Achieved      PT SHORT TERM GOAL #3   Title  Pt to be able to put dishes and groceries up to shoulder or higher cabinets without increased pain .  Baseline  Pt reports she can load dish washer but has trouble with putting items in the higher cabinets    Time  2    Period  Weeks    Status  Partially Met        PT Long Term Goals - 05/19/18 1101      PT LONG TERM GOAL #1   Title  Pt to be walking 20 minutes a day.    Baseline  pt is walking 15 minutes 3-4x/week    Time  4    Period  Weeks    Status  On-going      PT LONG TERM GOAL #2   Title  PT hip and back ROM to be improved to allow pt to pick up items off the floor with ease     Time  4    Period  Weeks     Status  On-going      PT LONG TERM GOAL #3   Title  Pt to be I in an advance exercise program to continue improving balance and core strength to be able to complete yard and housework without increased pain.     Baseline  pt participating in initial HEP    Time  4    Period  Weeks    Status  Partially Met        Plan - 05/19/18 1155    Clinical Impression Statement  Re-Evaluation performed today and patient has made good progress towards goals. She improve LE strength for limited muscle groups tested and demonstrated significant change in LE strength with 5x sit<>stand test. She remains limited with lumbar ROM and reports pain with AROM in all planes. She has been consistent with HEP program and will be able to advance exercises as pain decreases. Ms. Armas will benefit from additional skilled PT interventions to address impairments and initiate aquatic based exercises for mobility and strengthening in gravity reduced environment to relieve strain on low back.    Rehab Potential  Good    PT Frequency  2x / week    PT Duration  4 weeks    PT Treatment/Interventions  ADLs/Self Care Home Management;Functional mobility training;Therapeutic activities;Therapeutic exercise;Balance training;Neuromuscular re-education;Patient/family education;Manual techniques;Aquatic Therapy;Cryotherapy;Electrical Stimulation;Moist Heat;Passive range of motion;Spinal Manipulations;Joint Manipulations    PT Next Visit Plan  Initaite aquatic exercise and follow up on massage therapy. Continue with manual lumbar traction for pain relief and functional LE strengthening.     PT Home Exercise Plan  tall sitting, scapular retraction and transverse abdominal activation.   thoracic and 3 D Excursions, walking     Consulted and Agree with Plan of Care  Patient       Patient will benefit from skilled therapeutic intervention in order to improve the following deficits and impairments:  Decreased activity tolerance, Decreased  balance, Decreased strength, Difficulty walking, Decreased range of motion, Pain  Visit Diagnosis: Stiffness of right hip, not elsewhere classified  Stiffness of left hip, not elsewhere classified  Radiculopathy, lumbar region     Problem List There are no active problems to display for this patient.   Kipp Brood, PT, DPT, [facility] Physical Therapist with [facility]  05/19/2018 11:58 AM    [place], [place], [address] Phone: [phone]   Fax:  [phone]  Name: Katie Davis MRN: [id number] Date of Birth: 1955-11-23

## 2018-05-24 ENCOUNTER — Ambulatory Visit (HOSPITAL_COMMUNITY): Payer: Worker's Compensation | Admitting: Physical Therapy

## 2018-05-24 ENCOUNTER — Ambulatory Visit (HOSPITAL_COMMUNITY): Payer: Worker's Compensation

## 2018-05-24 ENCOUNTER — Telehealth (HOSPITAL_COMMUNITY): Payer: Self-pay

## 2018-05-24 NOTE — Telephone Encounter (Signed)
She is not feeling well and will see her MD today if they can work her in. Patient plans to be here on Thursday.

## 2018-05-26 ENCOUNTER — Encounter (HOSPITAL_COMMUNITY): Payer: Self-pay

## 2018-05-26 ENCOUNTER — Ambulatory Visit (HOSPITAL_COMMUNITY): Payer: Worker's Compensation

## 2018-05-26 ENCOUNTER — Other Ambulatory Visit: Payer: Self-pay

## 2018-05-26 DIAGNOSIS — M25652 Stiffness of left hip, not elsewhere classified: Secondary | ICD-10-CM

## 2018-05-26 DIAGNOSIS — M5416 Radiculopathy, lumbar region: Secondary | ICD-10-CM

## 2018-05-26 DIAGNOSIS — M25651 Stiffness of right hip, not elsewhere classified: Secondary | ICD-10-CM

## 2018-05-26 NOTE — Therapy (Signed)
[place], [place], [address] Phone: [phone]   Fax:  [phone]  Physical Therapy Treatment  Patient Details  Name: Katie Davis MRN: [id number] Date of Birth: 03-Mar-1956 Referring Provider (PT): Hiram Comber   Encounter Date: 05/26/2018  PT End of Session - 05/26/18 1127    Visit Number  2    Number of Visits  8    Date for PT Re-Evaluation  05/06/18    Authorization Type  Workmans Comp (8 visits approved 1-22 - 05-06-18 (Approval # [id number])) New: 8 units approved from 05/19/18-06/24/18 Approval # [id number]    Authorization Time Period  04/06/2018-05/05/2018; New 05/19/2018-06/30/2018    Authorization - Visit Number  2    Authorization - Number of Visits  8    PT Start Time  1122    PT Stop Time  1200    PT Time Calculation (min)  38 min    Activity Tolerance  Patient tolerated treatment well    Behavior During Therapy  [facility] for tasks assessed/performed       History reviewed. No pertinent past medical history.  History reviewed. No pertinent surgical history.  There were no vitals filed for this visit.  Subjective Assessment - 05/26/18 1126    Subjective  Patient reports she is doing well today and is having about 5/10 pain in the middle of her lumbar spine. Her legs are not bothering her tooo much and she states her Rt is a bit more sore in the anterior thigh compared to her left.     How long can you sit comfortably?  sometimes limited to 5-30 minutes; hard surfaces are worse    How long can you walk comfortably?  walking 15 minutes about 4 days a week     Patient Stated Goals  to be able to walk longer, exercise,     Currently in Pain?  Yes    Pain Score  5     Pain Location  Back    Pain Orientation  Lower    Pain Descriptors / Indicators  Aching    Pain Type  Chronic pain    Pain Radiating Towards  tingling and ache in Rt thigh    Pain Onset  More than a month ago    Pain Frequency  Constant     Aggravating Factors   sitting, laying down on hard surfaces    Pain Relieving Factors  rest    Effect of Pain on Daily Activities  moderate    Pain Onset  --      [facility] Adult PT Treatment/Exercise - 05/26/18 0001      Exercises   Exercises  Lumbar      Lumbar Exercises: Standing   Functional Squats  10 reps    Functional Squats Limitations  2 sets    Forward Lunge  10 reps;Limitations    Forward Lunge Limitations  4" step, 2 sets, 1 HHA    Other Standing Lumbar Exercises  Deadlift: no weight, form for hip hinge. 10x dowel on spine to maintain neutral spine and hinge at hip. 10x with hinge at hip and knee flexion into stand.       Manual Therapy   Manual Therapy  Manual Traction;Joint mobilization    Manual therapy comments  performed seperate from other interventions    Joint Mobilization  5 minutes LAD at Lt hip (closed pack position) Grade IV to pump lumbar spine    Manual Traction  3x 30-45 seconds of manual traction with mobilization belt to lumbar spine in supine        PT Education - 05/26/18 1127    Education Details  Educated on purpose of interventions throughout session and on plan to initiate aquatic therapy next week.     Person(s) Educated  Patient    Methods  Explanation    Comprehension  Verbalized understanding       PT Short Term Goals - 05/19/18 1100      PT SHORT TERM GOAL #1   Title  Pt to state that she is walking 10 minutes a day.    Baseline  pt is walking 15 minutes 3-4x/week    Time  2    Period  Weeks    Status  Achieved      PT SHORT TERM GOAL #2   Title  PT LE strength to increase to allow pt to be able to go up and down 5 steps in a reciprocal manner without increased pain.     Baseline  pt reports she is able to go up/down more easily with 1 railng    Time  2    Period  Weeks    Status  Achieved      PT SHORT TERM GOAL #3   Title  Pt to be able to put dishes and groceries up to shoulder or higher cabinets without increased pain .      Baseline  Pt reports she can load dish washer but has trouble with putting items in the higher cabinets    Time  2    Period  Weeks    Status  Partially Met        PT Long Term Goals - 05/19/18 1101      PT LONG TERM GOAL #1   Title  Pt to be walking 20 minutes a day.    Baseline  pt is walking 15 minutes 3-4x/week    Time  4    Period  Weeks    Status  On-going      PT LONG TERM GOAL #2   Title  PT hip and back ROM to be improved to allow pt to pick up items off the floor with ease     Time  4    Period  Weeks    Status  On-going      PT LONG TERM GOAL #3   Title  Pt to be I in an advance exercise program to continue improving balance and core strength to be able to complete yard and housework without increased pain.     Baseline  pt participating in initial HEP    Time  4    Period  Weeks    Status  Partially Met        Plan - 05/26/18 1208    Clinical Impression Statement  Initiated therapy session with manual interventions to mobilize lumbar spine and reduce inflammatory factors surrounding lumbar joint spaces. Performed long axis distraction with Lt hip in closed packed position with grade IV oscillations and grade IV oscillations for lumbar manual tractions. Patient reported reduced symptoms into bil LE's and slightly increased discomfort with central lumbar spine. She was instructed on squats and deadlift technique this session and demonstrated good hip hinge with ability to maintain neutral spine. She will continue to benefit from additional skilled PT interventions to address impairments and initiate aquatic based exercises for mobility and strengthening in gravity reduced environment to relieve  strain on low back.    Rehab Potential  Good    PT Frequency  2x / week    PT Duration  4 weeks    PT Treatment/Interventions  ADLs/Self Care Home Management;Functional mobility training;Therapeutic activities;Therapeutic exercise;Balance training;Neuromuscular  re-education;Patient/family education;Manual techniques;Aquatic Therapy;Cryotherapy;Electrical Stimulation;Moist Heat;Passive range of motion;Spinal Manipulations;Joint Manipulations    PT Next Visit Plan   Initaite aquatic exercise and follow up on massage therapy. Continue with manual lumbar traction for pain relief and functional LE strengthening. Progress functional strengthening with deadlift as able.    PT Home Exercise Plan  tall sitting, scapular retraction and transverse abdominal activation.   thoracic and 3 D Excursions, walking     Consulted and Agree with Plan of Care  Patient       Patient will benefit from skilled therapeutic intervention in order to improve the following deficits and impairments:  Decreased activity tolerance, Decreased balance, Decreased strength, Difficulty walking, Decreased range of motion, Pain  Visit Diagnosis: Stiffness of right hip, not elsewhere classified  Stiffness of left hip, not elsewhere classified  Radiculopathy, lumbar region     Problem List There are no active problems to display for this patient.   Kipp Brood, PT, DPT, [facility] Physical Therapist with [facility]  05/26/2018 12:16 PM    [place] [address] [place], [place], [address] Phone: [phone]   Fax:  [phone]  Name: Katie Davis MRN: [id number] Date of Birth: [birth year]

## 2018-05-30 ENCOUNTER — Telehealth (HOSPITAL_COMMUNITY): Payer: Self-pay | Admitting: *Deleted

## 2018-05-30 ENCOUNTER — Telehealth (HOSPITAL_COMMUNITY): Payer: Self-pay

## 2018-05-30 NOTE — Telephone Encounter (Signed)
Pt states she called and l/m to cx this weeks apptments this morning, they are marked as "no show" . Pt cx due to the coronavirus and will call next week to let us know what she wants to do. Her health is not the best and she wants to stay in this week.

## 2018-05-30 NOTE — Telephone Encounter (Signed)
05/30/18  Pt left a message to cx this week's appts.  She said that she was going to back off for this week and she has already talked with her Case RN about this.

## 2018-05-31 ENCOUNTER — Ambulatory Visit (HOSPITAL_COMMUNITY): Payer: Worker's Compensation

## 2018-06-02 ENCOUNTER — Ambulatory Visit (HOSPITAL_COMMUNITY): Payer: Worker's Compensation | Admitting: Physical Therapy

## 2018-06-03 ENCOUNTER — Telehealth (HOSPITAL_COMMUNITY): Payer: Self-pay

## 2018-06-03 NOTE — Telephone Encounter (Signed)
I called Ms. Vergara at her home number and left a voice message. I informed her that our office is closing for the next 2 weeks to protect our patient's and staffs safety and well being and that our tentative re-open date is April 6th. I asked that she call our front office at [phone] to let us know if she would like to reschedule her appointments for after that or if she would like to be placed on a call list to let her know when we re-open.   Valentino Saxon, PT, DPT, [facility] Physical Therapist with [facility]  06/03/2018 11:20 AM

## 2018-06-03 NOTE — Telephone Encounter (Signed)
Ms. Pietrzak called back and I spoke with her regarding our office closing. She would be interested in telehealth if it becomes available and in a weekly phone call. She would also like to be called when we re-open to decide if she wants to reschedule more visits. She does not need an update on her HEP.  Valentino Saxon, PT, DPT, [facility] Physical Therapist with [facility]  06/03/2018 12:14 PM

## 2018-06-07 ENCOUNTER — Ambulatory Visit (HOSPITAL_COMMUNITY): Payer: Worker's Compensation

## 2018-06-09 ENCOUNTER — Ambulatory Visit (HOSPITAL_COMMUNITY): Payer: Self-pay

## 2018-06-10 ENCOUNTER — Telehealth (HOSPITAL_COMMUNITY): Payer: Self-pay | Admitting: Physical Therapy

## 2018-06-10 NOTE — Telephone Encounter (Signed)
Therapist called to check in on patient. There was no response so left message. Told patient that if she had any questions or concerns to call the clinic. Also explained that we would call the patient once the clinic was re-opening to schedule remaining appointments.   Verne Carrow PT, DPT 1:25 PM, 06/10/18 [phone]

## 2018-06-14 ENCOUNTER — Encounter (HOSPITAL_COMMUNITY): Payer: Self-pay

## 2018-06-14 ENCOUNTER — Encounter

## 2018-06-16 ENCOUNTER — Encounter (HOSPITAL_COMMUNITY): Payer: Self-pay

## 2018-06-22 ENCOUNTER — Telehealth (HOSPITAL_COMMUNITY): Payer: Self-pay

## 2018-06-22 NOTE — Telephone Encounter (Signed)
Attempted to contact pt for her weekly check-in, discuss telehealth, and getting her activated on MyChart so that once our clinic has telehealth up and running, she would be ready to go. No answer so left voicemail for pt to return our phone call regarding this.   Jac Canavan PT, DPT

## 2018-06-27 ENCOUNTER — Telehealth (HOSPITAL_COMMUNITY): Payer: Self-pay

## 2018-06-27 NOTE — Telephone Encounter (Signed)
Mrs. Parron was contacted today regarding temporary reduction of Outpatient Rehabilitation Services due to concerns for community transmission of COVID-19.  Patient identity was verified.  Assessed if patient needed to be seen in person by clinician (recent fall or acute injury that requires hands on assessment and advice, change in diet order, post-surgical, special cases, etc.).    Patient did not have an acute/special need that requires in person visit. Proceeded with phone call.  Therapist advised the patient to continue to perform his/her HEP and assured he/she had no unanswered questions or concerns at this time.  The patient was re-offered and declined the continuation of their plan of care by using a telehealth visit.  Outpatient Rehabilitation Services will follow up with this client when we are able to safely resume care.    Patient is aware we can be reached by telephone during limited business hours in the meantime and she stated that if she noticed a decline in her progress, she would contact us about new HEP and/or telehealth sessions.  Jac Canavan PT, DPT [phone]

## 2018-06-27 NOTE — Telephone Encounter (Signed)
Per Jac Canavan patient Katie Davis requested not to do Telehealth.

## 2018-07-20 LAB — TSH: TSH: 0.94 (ref 0.41–5.90)

## 2018-07-26 ENCOUNTER — Telehealth (HOSPITAL_COMMUNITY): Payer: Self-pay

## 2018-07-26 NOTE — Telephone Encounter (Signed)
I called and spoke with Katie Davis and confirmed her identity. She reports she has been doing well during our closure but recently had a flare up of her fibromyalgia and thyroid problems. She would like to find out if she can get more approval through [employer] and she would like to wait 1-2 weeks to see if she is feeling better and can determine how her hip and back are doing. I instructed her to call us and let us know if she would like to come back or be discharged and that if we haven't heard form her in the next 2 weeks we will call to follow up.   Valentino Saxon, PT, DPT, [facility] Physical Therapist with [facility]  07/26/2018 10:18 AM

## 2018-08-18 DIAGNOSIS — E039 Hypothyroidism, unspecified: Secondary | ICD-10-CM | POA: Diagnosis not present

## 2018-08-18 DIAGNOSIS — Z0189 Encounter for other specified special examinations: Secondary | ICD-10-CM | POA: Diagnosis not present

## 2018-08-18 DIAGNOSIS — K588 Other irritable bowel syndrome: Secondary | ICD-10-CM | POA: Diagnosis not present

## 2018-08-18 DIAGNOSIS — E860 Dehydration: Secondary | ICD-10-CM | POA: Diagnosis not present

## 2018-08-18 DIAGNOSIS — Z20828 Contact with and (suspected) exposure to other viral communicable diseases: Secondary | ICD-10-CM | POA: Diagnosis not present

## 2018-08-18 DIAGNOSIS — I10 Essential (primary) hypertension: Secondary | ICD-10-CM | POA: Diagnosis not present

## 2018-08-18 DIAGNOSIS — A0839 Other viral enteritis: Secondary | ICD-10-CM | POA: Diagnosis not present

## 2018-09-29 DIAGNOSIS — E039 Hypothyroidism, unspecified: Secondary | ICD-10-CM | POA: Diagnosis not present

## 2018-09-30 ENCOUNTER — Other Ambulatory Visit: Payer: Self-pay | Admitting: Internal Medicine

## 2018-09-30 DIAGNOSIS — E2839 Other primary ovarian failure: Secondary | ICD-10-CM

## 2018-09-30 DIAGNOSIS — Z780 Asymptomatic menopausal state: Secondary | ICD-10-CM

## 2018-10-03 ENCOUNTER — Telehealth: Payer: Self-pay

## 2018-10-03 NOTE — Telephone Encounter (Addendum)
Patient called wanting to be seen for her back pain. Stated it is a worker's comp claim and she was seeing someone in [place], now she is needing someone to take over her care. Stated that back in January or February her nurse case manager (Susan)she thinks called and was told to send notes, reports, etc to our office. I told the patient that I didn't see notes about this in her chart, but she was very adamant about this. I explained that I would get with the other ladies and see if they know anything about this, then I would give her a call. I told her it could be a couple of days before I get back to her and she was ok with that.  7/21-- Spoke with patient and relayed that our office declined to see her back in the first part of the year and the worker's comp adjuster was advised of this back then. She was very understandable and stated she was wanting to get close to home, but it looks like she will be going to [place].

## 2018-10-04 DIAGNOSIS — F419 Anxiety disorder, unspecified: Secondary | ICD-10-CM | POA: Diagnosis not present

## 2018-10-04 DIAGNOSIS — M1711 Unilateral primary osteoarthritis, right knee: Secondary | ICD-10-CM | POA: Diagnosis not present

## 2018-10-04 DIAGNOSIS — M545 Low back pain: Secondary | ICD-10-CM | POA: Diagnosis not present

## 2018-10-04 DIAGNOSIS — M3500 Sicca syndrome, unspecified: Secondary | ICD-10-CM | POA: Diagnosis not present

## 2018-10-27 ENCOUNTER — Ambulatory Visit (HOSPITAL_COMMUNITY): Payer: BC Managed Care – PPO

## 2018-10-28 ENCOUNTER — Encounter (HOSPITAL_COMMUNITY): Payer: Self-pay

## 2018-10-28 ENCOUNTER — Ambulatory Visit (HOSPITAL_COMMUNITY)
Admission: RE | Admit: 2018-10-28 | Discharge: 2018-10-28 | Disposition: A | Discharge status: Home / Self Care | Payer: BC Managed Care – PPO | Source: Ambulatory Visit | Attending: Internal Medicine | Admitting: Internal Medicine

## 2018-10-28 ENCOUNTER — Other Ambulatory Visit: Payer: Self-pay

## 2018-10-28 DIAGNOSIS — M810 Age-related osteoporosis without current pathological fracture: Secondary | ICD-10-CM | POA: Diagnosis not present

## 2018-10-28 DIAGNOSIS — Z780 Asymptomatic menopausal state: Secondary | ICD-10-CM | POA: Insufficient documentation

## 2018-10-28 DIAGNOSIS — E2839 Other primary ovarian failure: Secondary | ICD-10-CM | POA: Insufficient documentation

## 2018-10-28 DIAGNOSIS — Z1231 Encounter for screening mammogram for malignant neoplasm of breast: Secondary | ICD-10-CM | POA: Diagnosis not present

## 2018-10-28 DIAGNOSIS — Z13820 Encounter for screening for osteoporosis: Secondary | ICD-10-CM | POA: Diagnosis not present

## 2018-10-28 DIAGNOSIS — M85851 Other specified disorders of bone density and structure, right thigh: Secondary | ICD-10-CM | POA: Diagnosis not present

## 2018-10-31 ENCOUNTER — Other Ambulatory Visit (HOSPITAL_COMMUNITY): Payer: BC Managed Care – PPO

## 2018-10-31 DIAGNOSIS — M810 Age-related osteoporosis without current pathological fracture: Secondary | ICD-10-CM | POA: Diagnosis not present

## 2018-11-11 DIAGNOSIS — E039 Hypothyroidism, unspecified: Secondary | ICD-10-CM | POA: Diagnosis not present

## 2018-11-11 DIAGNOSIS — M810 Age-related osteoporosis without current pathological fracture: Secondary | ICD-10-CM | POA: Diagnosis not present

## 2018-11-11 DIAGNOSIS — I10 Essential (primary) hypertension: Secondary | ICD-10-CM | POA: Diagnosis not present

## 2018-11-14 DIAGNOSIS — Z0001 Encounter for general adult medical examination with abnormal findings: Secondary | ICD-10-CM | POA: Diagnosis not present

## 2018-11-14 DIAGNOSIS — I10 Essential (primary) hypertension: Secondary | ICD-10-CM | POA: Diagnosis not present

## 2018-11-14 DIAGNOSIS — E039 Hypothyroidism, unspecified: Secondary | ICD-10-CM | POA: Diagnosis not present

## 2018-11-14 DIAGNOSIS — M810 Age-related osteoporosis without current pathological fracture: Secondary | ICD-10-CM | POA: Diagnosis not present

## 2018-11-14 NOTE — Discharge Instructions (Signed)
Denosumab injection °What is this medicine? °DENOSUMAB (den oh sue mab) slows bone breakdown. Prolia is used to treat osteoporosis in women after menopause and in men, and in people who are taking corticosteroids for 6 months or more. Xgeva is used to treat a high calcium level due to cancer and to prevent bone fractures and other bone problems caused by multiple myeloma or cancer bone metastases. Xgeva is also used to treat giant cell tumor of the bone. °This medicine may be used for other purposes; ask your health care provider or pharmacist if you have questions. °COMMON BRAND NAME(S): Prolia, XGEVA °What should I tell my health care provider before I take this medicine? °They need to know if you have any of these conditions: °· dental disease °· having surgery or tooth extraction °· infection °· kidney disease °· low levels of calcium or Vitamin D in the blood °· malnutrition °· on hemodialysis °· skin conditions or sensitivity °· thyroid or parathyroid disease °· an unusual reaction to denosumab, other medicines, foods, dyes, or preservatives °· pregnant or trying to get pregnant °· breast-feeding °How should I use this medicine? °This medicine is for injection under the skin. It is given by a health care professional in a hospital or clinic setting. °A special MedGuide will be given to you before each treatment. Be sure to read this information carefully each time. °For Prolia, talk to your pediatrician regarding the use of this medicine in children. Special care may be needed. For Xgeva, talk to your pediatrician regarding the use of this medicine in children. While this drug may be prescribed for children as young as 13 years for selected conditions, precautions do apply. °Overdosage: If you think you have taken too much of this medicine contact a poison control center or emergency room at once. °NOTE: This medicine is only for you. Do not share this medicine with others. °What if I miss a dose? °It is  important not to miss your dose. Call your doctor or health care professional if you are unable to keep an appointment. °What may interact with this medicine? °Do not take this medicine with any of the following medications: °· other medicines containing denosumab °This medicine may also interact with the following medications: °· medicines that lower your chance of fighting infection °· steroid medicines like prednisone or cortisone °This list may not describe all possible interactions. Give your health care provider a list of all the medicines, herbs, non-prescription drugs, or dietary supplements you use. Also tell them if you smoke, drink alcohol, or use illegal drugs. Some items may interact with your medicine. °What should I watch for while using this medicine? °Visit your doctor or health care professional for regular checks on your progress. Your doctor or health care professional may order blood tests and other tests to see how you are doing. °Call your doctor or health care professional for advice if you get a fever, chills or sore throat, or other symptoms of a cold or flu. Do not treat yourself. This drug may decrease your body's ability to fight infection. Try to avoid being around people who are sick. °You should make sure you get enough calcium and vitamin D while you are taking this medicine, unless your doctor tells you not to. Discuss the foods you eat and the vitamins you take with your health care professional. °See your dentist regularly. Brush and floss your teeth as directed. Before you have any dental work done, tell your dentist you are   receiving this medicine. Do not become pregnant while taking this medicine or for 5 months after stopping it. Talk with your doctor or health care professional about your birth control options while taking this medicine. Women should inform their doctor if they wish to become pregnant or think they might be pregnant. There is a potential for serious side  effects to an unborn child. Talk to your health care professional or pharmacist for more information. What side effects may I notice from receiving this medicine? Side effects that you should report to your doctor or health care professional as soon as possible:  allergic reactions like skin rash, itching or hives, swelling of the face, lips, or tongue  bone pain  breathing problems  dizziness  jaw pain, especially after dental work  redness, blistering, peeling of the skin  signs and symptoms of infection like fever or chills; cough; sore throat; pain or trouble passing urine  signs of low calcium like fast heartbeat, muscle cramps or muscle pain; pain, tingling, numbness in the hands or feet; seizures  unusual bleeding or bruising  unusually weak or tired Side effects that usually do not require medical attention (report to your doctor or health care professional if they continue or are bothersome):  constipation  diarrhea  headache  joint pain  loss of appetite  muscle pain  runny nose  tiredness  upset stomach This list may not describe all possible side effects. Call your doctor for medical advice about side effects. You may report side effects to FDA at 1-800-FDA-1088. Where should I keep my medicine? This medicine is only given in a clinic, doctor's office, or other health care setting and will not be stored at home. NOTE: This sheet is a summary. It may not cover all possible information. If you have questions about this medicine, talk to your doctor, pharmacist, or health care provider.  2020 Elsevier/Gold Standard (2017-07-09 16:10:44)

## 2018-11-16 ENCOUNTER — Encounter (HOSPITAL_COMMUNITY): Payer: Self-pay

## 2018-11-16 ENCOUNTER — Encounter (HOSPITAL_COMMUNITY)
Admission: RE | Admit: 2018-11-16 | Discharge: 2018-11-16 | Disposition: A | Discharge status: Home / Self Care | Payer: BC Managed Care – PPO | Source: Ambulatory Visit | Attending: Internal Medicine | Admitting: Internal Medicine

## 2018-11-16 ENCOUNTER — Other Ambulatory Visit: Payer: Self-pay

## 2018-11-16 DIAGNOSIS — K30 Functional dyspepsia: Secondary | ICD-10-CM | POA: Insufficient documentation

## 2018-11-16 HISTORY — DX: Irritable bowel syndrome, unspecified: K58.9

## 2018-11-16 HISTORY — DX: Anxiety disorder, unspecified: F41.9

## 2018-11-16 HISTORY — DX: Body mass index (BMI) 30.0-30.9, adult: Z68.30

## 2018-11-16 HISTORY — DX: Celiac disease: K90.0

## 2018-11-16 HISTORY — DX: Essential (primary) hypertension: I10

## 2018-11-16 HISTORY — DX: Obesity, unspecified: E66.9

## 2018-11-16 HISTORY — DX: Hypothyroidism, unspecified: E03.9

## 2018-11-16 HISTORY — DX: Fibromyalgia: M79.7

## 2018-11-16 HISTORY — DX: Chronic vascular disorders of intestine: K55.1

## 2018-11-16 HISTORY — DX: Hyperlipidemia, unspecified: E78.5

## 2018-11-16 HISTORY — DX: Headache, unspecified: R51.9

## 2018-11-16 HISTORY — DX: Age-related osteoporosis without current pathological fracture: M81.0

## 2018-11-16 HISTORY — DX: Unspecified abnormal cytological findings in specimens from cervix uteri: R87.619

## 2018-11-16 MED ORDER — DENOSUMAB 60 MG/ML ~~LOC~~ SOSY
60.0000 mg | PREFILLED_SYRINGE | Freq: Once | SUBCUTANEOUS | Status: AC
  Administered 2018-11-16: 11:00:00 60 mg via SUBCUTANEOUS

## 2018-11-17 DIAGNOSIS — D485 Neoplasm of uncertain behavior of skin: Secondary | ICD-10-CM | POA: Diagnosis not present

## 2018-11-17 DIAGNOSIS — D225 Melanocytic nevi of trunk: Secondary | ICD-10-CM | POA: Diagnosis not present

## 2018-11-17 DIAGNOSIS — D2271 Melanocytic nevi of right lower limb, including hip: Secondary | ICD-10-CM | POA: Diagnosis not present

## 2018-11-17 DIAGNOSIS — Z1283 Encounter for screening for malignant neoplasm of skin: Secondary | ICD-10-CM | POA: Diagnosis not present

## 2018-11-29 ENCOUNTER — Telehealth: Payer: Self-pay | Admitting: Women's Health

## 2018-11-29 NOTE — Telephone Encounter (Signed)
Called patient regarding appointment and the following message was left: ° ° °We have you scheduled for an upcoming appointment at our office. At this time, patients are encouraged to come alone to their visits whenever possible, however, a support person, over age 62, may accompany you to your appointment if assistance is needed for safety or care concerns. Otherwise, support persons should remain outside until the visit is complete.  ° °We ask if you have had any exposure to anyone suspected or confirmed of having COVID-19 or if you are experiencing any of the following, to call and reschedule your appointment: fever, cough, shortness of breath, muscle pain, diarrhea, rash, vomiting, abdominal pain, red eye, weakness, bruising, bleeding, joint pain, or a severe headache.  ° °Please know we will ask you these questions or similar questions when you arrive for your appointment and again it’s how we are keeping everyone safe.   ° °Also,to keep you safe, please use the provided hand sanitizer when you enter the office. We are asking everyone in the office to wear a mask to help prevent the spread of °germs. If you have a mask of your own, please wear it to your appointment, if not, we are happy to provide one for you. ° °Thank you for understanding and your cooperation.  ° ° °[facility] Staff ° ° ° ° ° °

## 2018-11-30 ENCOUNTER — Encounter: Payer: BC Managed Care – PPO | Admitting: Obstetrics and Gynecology

## 2018-12-09 ENCOUNTER — Ambulatory Visit (INDEPENDENT_AMBULATORY_CARE_PROVIDER_SITE_OTHER): Payer: BC Managed Care – PPO | Admitting: "Endocrinology

## 2018-12-09 ENCOUNTER — Other Ambulatory Visit: Payer: Self-pay

## 2018-12-09 ENCOUNTER — Encounter: Payer: Self-pay | Admitting: "Endocrinology

## 2018-12-09 VITALS — BP 119/77 | HR 76 | Temp 97.9°F | Ht 63.0 in | Wt 171.6 lb

## 2018-12-09 DIAGNOSIS — E782 Mixed hyperlipidemia: Secondary | ICD-10-CM | POA: Diagnosis not present

## 2018-12-09 DIAGNOSIS — E890 Postprocedural hypothyroidism: Secondary | ICD-10-CM | POA: Diagnosis not present

## 2018-12-09 DIAGNOSIS — R87619 Unspecified abnormal cytological findings in specimens from cervix uteri: Secondary | ICD-10-CM | POA: Diagnosis not present

## 2018-12-09 DIAGNOSIS — M797 Fibromyalgia: Secondary | ICD-10-CM | POA: Diagnosis not present

## 2018-12-09 DIAGNOSIS — K581 Irritable bowel syndrome with constipation: Secondary | ICD-10-CM | POA: Diagnosis not present

## 2018-12-09 DIAGNOSIS — Z20828 Contact with and (suspected) exposure to other viral communicable diseases: Secondary | ICD-10-CM | POA: Diagnosis not present

## 2018-12-09 DIAGNOSIS — I10 Essential (primary) hypertension: Secondary | ICD-10-CM | POA: Diagnosis not present

## 2018-12-09 LAB — TSH: TSH: 1.31 (ref 0.41–5.90)

## 2018-12-09 NOTE — Progress Notes (Signed)
Endocrinology Consult Note                                            12/09/2018, 2:27 PM   Subjective:    Patient ID: Katie Davis, female    DOB: 1956/02/17, PCP Benita Stabile, MD   Past Medical History:  Diagnosis Date  . Abnormal glandular Papanicolaou smear of cervix   . Anxiety   . Body mass index 30.0-30.9, adult   . Celiac disease   . Chronic vascular disorder of intestine (HCC)   . Fibromyalgia   . Headache    Migrains  . Hyperlipidemia   . Hypertension   . Hypothyroidism   . IBS (irritable bowel syndrome) With constipation   . Obesity, unspecified   . Osteoporosis    Past Surgical History:  Procedure Laterality Date  . REDUCTION MAMMAPLASTY     Social History   Socioeconomic History  . Marital status: Unknown    Spouse name: Not on file  . Number of children: Not on file  . Years of education: Not on file  . Highest education level: Not on file  Occupational History  . Not on file  Social Needs  . Financial resource strain: Not on file  . Food insecurity    Worry: Not on file    Inability: Not on file  . Transportation needs    Medical: Not on file    Non-medical: Not on file  Tobacco Use  . Smoking status: Never Smoker  . Smokeless tobacco: Never Used  Substance and Sexual Activity  . Alcohol use: Never    Frequency: Never  . Drug use: Never  . Sexual activity: Not on file  Lifestyle  . Physical activity    Days per week: Not on file    Minutes per session: Not on file  . Stress: Not on file  Relationships  . Social Musician on phone: Not on file    Gets together: Not on file    Attends religious service: Not on file    Active member of club or organization: Not on file    Attends meetings of clubs or organizations: Not on file    Relationship status: Not on file  Other Topics Concern  . Not on file  Social History Narrative  . Not on file   History reviewed. No pertinent family history. Outpatient Encounter  Medications as of 12/09/2018  Medication Sig  . gabapentin (NEURONTIN) 100 MG capsule Take 100 mg by mouth at bedtime.  Marland Kitchen oxyCODONE-acetaminophen (PERCOCET/ROXICET) 5-325 MG tablet Take by mouth every 6 (six) hours as needed for severe pain.  . [DISCONTINUED] omeprazole (PRILOSEC) 20 MG capsule Take 20 mg by mouth 2 (two) times daily.  Marland Kitchen atenolol (TENORMIN) 50 MG tablet Take 50 mg by mouth 2 (two) times daily.  . clonazePAM (KLONOPIN) 0.5 MG tablet Take 0.5 mg by mouth daily.  . fluticasone (FLOVENT HFA) 110 MCG/ACT inhaler Inhale 1 puff into the lungs 2 (two) times daily.  Marland Kitchen ibuprofen (ADVIL,MOTRIN) 800 MG tablet Take 800 mg by mouth 3 (three) times daily.   Marland Kitchen levothyroxine (SYNTHROID) 112 MCG tablet Take 112 mcg by mouth daily before breakfast.  . meclizine (ANTIVERT) 12.5 MG tablet Take 12.5-25 mg by mouth every 6 (six) hours.  . ondansetron (ZOFRAN) 4 MG tablet Take 4  mg by mouth every 8 (eight) hours as needed for nausea or vomiting.  . [DISCONTINUED] DULoxetine (CYMBALTA) 20 MG capsule Take 20 mg by mouth daily.   No facility-administered encounter medications on file as of 12/09/2018.    ALLERGIES: Allergies  Allergen Reactions  . Bactrim [Sulfamethoxazole-Trimethoprim] Nausea And Vomiting  . Gluten Meal     Ceiliac disease   . Keflex [Cephalexin]   . Latex Hives  . Levaquin [Levofloxacin] Nausea And Vomiting  . Sulfa Antibiotics Nausea And Vomiting  . Contrast Media [Iodinated Diagnostic Agents] Nausea And Vomiting and Rash    VACCINATION STATUS:  There is no immunization history on file for this patient.  HPI Katie Davis is 63 y.o. female who presents today with a medical history as above. she is being seen in consultation for post ablation hypothyroidism requested by Benita StabileHall, John Z, MD.  -History is obtained directly from the patient.  She reports that she underwent 2 doses of I-131 to ablate her thyroid due to Graves' disease in year 2000.  She was given thyroid hormone  replacement at various doses ranging from 100 to 175 mcg over the years.   [place] was [place], patient moved to this area recently.  She is currently on Synthroid 112 mcg p.o. every morning.  She is compliant with her medication taking it every day with water in the morning half an hour before other medications or food.  She does not have recent thyroid function tests, in May 2020 her TSH was on target.  She reports that her thyroid function tests have been fluctuating over the years.  She does not have acute symptoms, denies palpitations, tremors, heat/cold intolerance.  She has medical history of celiac disease, Sjogren's syndrome. -She has family history of Hashimoto's thyroiditis in her sister, no history of goiter nor family history of thyroid malignancy.   Review of Systems  Constitutional: no recent weight gain/loss, + fatigue, no subjective hyperthermia, no subjective hypothermia Eyes: no blurry vision, no xerophthalmia ENT: no sore throat, no nodules palpated in throat, no dysphagia/odynophagia, no hoarseness Cardiovascular: no Chest Pain, no Shortness of Breath, no palpitations, no leg swelling Respiratory: no cough, no shortness of breath Gastrointestinal: no Nausea/Vomiting/Diarhhea Musculoskeletal: no muscle/joint aches Skin: no rashes Neurological: no tremors, no numbness, no tingling, no dizziness Psychiatric: no depression, no anxiety  Objective:    BP 119/77 (BP Location: Right Arm, Patient Position: Sitting, Cuff Size: Normal)   Pulse 76   Temp 97.9 F (36.6 C) (Oral)   Ht 5\' 3"  (1.6 m)   Wt 171 lb 9.6 oz (77.8 kg)   SpO2 96%   BMI 30.40 kg/m   Wt Readings from Last 3 Encounters:  12/09/18 171 lb 9.6 oz (77.8 kg)  11/16/18 173 lb (78.5 kg)    Physical Exam  Constitutional:  Body mass index is 30.4 kg/m.,  not in acute distress, normal state of mind Eyes: PERRLA, EOMI, no exophthalmos ENT: moist mucous membranes, no gross  thyromegaly, no gross cervical lymphadenopathy Cardiovascular: normal precordial activity, Regular Rate and Rhythm, no Murmur/Rubs/Gallops Respiratory:  adequate breathing efforts, no gross chest deformity, Clear to auscultation bilaterally Gastrointestinal: abdomen soft, Non -tender, No distension, Bowel Sounds present, no gross organomegaly Musculoskeletal: no gross deformities, strength intact in all four extremities Skin: moist, warm, no rashes Neurological: no tremor with outstretched hands, Deep tendon reflexes normal in bilateral lower extremities.  Jul 20, 2018 TSH 0.935  Assessment & Plan:   1. Hypothyroidism following radioiodine therapy -  Jakylah Bassinger  is being seen at a kind request of [place] Crane, Edwinna Areola, MD. - I have reviewed her available thyroid records and clinically evaluated the patient. - Based on these reviews, she has post ablative hypothyroidism related to her history of Graves' disease which required 2 doses of I-131 in the year 2000. -She does not have recent thyroid function tests to review.  She agrees to obtain new set of thyroid function tests today before making adjustment in her Synthroid dose. In the meantime, she is advised to continue Synthroid 112 mcg p.o. daily before breakfast.  - We discussed about the correct intake of her thyroid hormone, on empty stomach at fasting, with water, separated by at least 30 minutes from breakfast and other medications,  and separated by more than 4 hours from calcium, iron, multivitamins, acid reflux medications (PPIs). -Patient is made aware of the fact that thyroid hormone replacement is needed for life, dose to be adjusted by periodic monitoring of thyroid function tests.  -she will return in 1 week to review her repeat labs. - I did not initiate any new prescriptions today. - I advised her  to maintain close follow up with Celene Squibb, MD for primary care needs.   - Time spent with the patient: 45 minutes, of which >50%  was spent in obtaining information about her symptoms, reviewing her previous labs/studies,  evaluations, and treatments, counseling her about her post ablative hypothyroidism, and developing a plan to confirm the diagnosis and long term treatment based on the latest standards of care/guidelines.    [name] participated in the discussions, expressed understanding, and voiced agreement with the above plans.  All questions were answered to her satisfaction. she is encouraged to contact clinic should she have any questions or concerns prior to her return visit.  Follow up plan: Return in about 1 week (around 12/16/2018) for Follow up with Pre-visit Labs, Labs Today- Non-Fasting Ok.   Glade Lloyd, MD [facility] [address] [place], [place] [address] Phone: [phone]  Fax: [phone]     12/09/2018, 2:27 PM  This note was partially dictated with voice recognition software. Similar sounding words can be transcribed inadequately or may not  be corrected upon review.

## 2018-12-13 ENCOUNTER — Telehealth: Payer: Self-pay | Admitting: Women's Health

## 2018-12-13 NOTE — Telephone Encounter (Signed)
Called patient regarding appointment scheduled in our office encouraged to come alone to the visit if possible, however, a support person, over age 62, may accompany her  to appointment if assistance is needed for safety or care concerns. Otherwise, support persons should remain outside until the visit is complete.  ° °We ask if you have had any exposure to anyone suspected or confirmed of having COVID-19 or if you are experiencing any of the following, to call and reschedule your appointment: fever, cough, shortness of breath, muscle pain, diarrhea, rash, vomiting, abdominal pain, red eye, weakness, bruising, bleeding, joint pain, or a severe headache.  ° °Please know we will ask you these questions or similar questions when you arrive for your appointment and again it’s how we are keeping everyone safe.   ° °Also,to keep you safe, please use the provided hand sanitizer when you enter the office. We are asking everyone in the office to wear a mask to help prevent the spread of °germs. If you have a mask of your own, please wear it to your appointment, if not, we are happy to provide one for you. ° °Thank you for understanding and your cooperation.  ° ° °[facility] Staff ° ° ° °

## 2018-12-14 ENCOUNTER — Ambulatory Visit (INDEPENDENT_AMBULATORY_CARE_PROVIDER_SITE_OTHER): Payer: BC Managed Care – PPO | Admitting: Women's Health

## 2018-12-14 ENCOUNTER — Other Ambulatory Visit: Payer: Self-pay

## 2018-12-14 ENCOUNTER — Other Ambulatory Visit (HOSPITAL_COMMUNITY)
Admission: RE | Admit: 2018-12-14 | Discharge: 2018-12-14 | Disposition: A | Discharge status: Home / Self Care | Payer: BC Managed Care – PPO | Source: Ambulatory Visit | Attending: Obstetrics and Gynecology | Admitting: Obstetrics and Gynecology

## 2018-12-14 ENCOUNTER — Encounter: Payer: Self-pay | Admitting: Women's Health

## 2018-12-14 VITALS — BP 121/82 | HR 68 | Ht 63.0 in

## 2018-12-14 DIAGNOSIS — Z01419 Encounter for gynecological examination (general) (routine) without abnormal findings: Secondary | ICD-10-CM | POA: Diagnosis not present

## 2018-12-14 DIAGNOSIS — Z1151 Encounter for screening for human papillomavirus (HPV): Secondary | ICD-10-CM | POA: Insufficient documentation

## 2018-12-14 DIAGNOSIS — N951 Menopausal and female climacteric states: Secondary | ICD-10-CM

## 2018-12-14 DIAGNOSIS — Z8742 Personal history of other diseases of the female genital tract: Secondary | ICD-10-CM | POA: Insufficient documentation

## 2018-12-14 DIAGNOSIS — Z124 Encounter for screening for malignant neoplasm of cervix: Secondary | ICD-10-CM | POA: Insufficient documentation

## 2018-12-14 DIAGNOSIS — Z780 Asymptomatic menopausal state: Secondary | ICD-10-CM | POA: Insufficient documentation

## 2018-12-14 DIAGNOSIS — N941 Unspecified dyspareunia: Secondary | ICD-10-CM

## 2018-12-14 DIAGNOSIS — R102 Pelvic and perineal pain: Secondary | ICD-10-CM

## 2018-12-14 MED ORDER — PAROXETINE MESYLATE 7.5 MG PO CAPS: 1.0000 | ORAL_CAPSULE | Freq: Every day | ORAL | 11 refills | Status: DC

## 2018-12-14 NOTE — Progress Notes (Signed)
BREAST EXAM & PAP SMEAR EXAMINATION Patient name: Katie Davis MRN [id number]  Date of birth: 1955-10-27 Chief Complaint:   Annual Exam (pap only)  History of Present Illness:   Katie Davis is a 63 y.o. G3P0030 Caucasian female being seen today for a routine well-woman exam.  Current complaints: feels like everything is messed up with her body, has a lot of autoimmune issues going on. Has celiac disease, IBS, fibromyalgia, raynaud's, sjogren's. Is in pain daily. Just moved here from [place]. Menarache @ 63yo, menopause @ 63yo. No vaginal bleeding since. All women in her family had hysterectomies in 42s for various reasons. Pt had discussed possible hysterectomy w/ her MD in [place] d/t her h/o abnormal paps. Has had cervical ablation. Had 3 SABs in her 55s. Remarried about 1 year ago. Pain w/ sex. Lower abd/pelvic pain, not sure if this is from her bowel issues or if it is GYN related, doesn't feel like her cramps she used to have w/ her periods. Vaginal dryness. Hot flashes. Mood swings, feels blue. Denies SI/HI. On Klonopin prn right now.  PCP: Loma Sousa @ Dr. Nevada Crane      does not desire labs No LMP recorded. Patient is postmenopausal. The current method of family planning is post menopausal status.  Last pap 2019. Results were: neg w/ +HRHPV, one prior was ASCUS w/ -HRHPV, one prior was neg/neg Last mammogram: 10/28/18. Results were: normal. Had double lumpectomy in past.  Last colonoscopy: 8 years ago. Results were: normal.  Review of Systems:   Pertinent items are noted in HPI Denies any headaches, blurred vision, fatigue, shortness of breath, chest pain, abdominal pain, abnormal vaginal discharge/itching/odor/irritation, problems with periods, bowel movements, urination, or intercourse unless otherwise stated above. Pertinent History Reviewed:  Reviewed past medical,surgical, social and family history.  Reviewed problem list, medications and allergies. Physical Assessment:   Vitals:    12/14/18 1134 12/14/18 1142  BP: (!) 141/78 121/82  Pulse: 68   Height: 5\' 3"  (1.6 m)   Body mass index is 30.4 kg/m.        Physical Examination:   General appearance - well appearing, and in no distress  Mental status - alert, oriented to person, place, and time  Psych:  She has a normal mood and affect  Skin - warm and dry, normal color, no suspicious lesions noted  Chest - effort normal  Heart - normal rate  Neck: not examined  Breasts - breasts appear normal, no suspicious masses, no skin or nipple changes or  axillary nodes  Abdomen - soft, nontender, nondistended, no masses or organomegaly  Pelvic - VULVA: normal appearing vulva with no masses, tenderness or lesions  VAGINA: normal appearing atrophic vagina with normal color and discharge, no lesions  CERVIX: normal appearing cervix without discharge or lesions, no CMT  Thin prep pap is done w/ HR HPV cotesting  UTERUS: uterus is felt to be normal size, shape, consistency and nontender   ADNEXA: No adnexal masses or tenderness noted.  Extremities:  No swelling or varicosities noted  No results found for this or any previous visit (from the past 24 hour(s)).  Assessment & Plan:  1) Breast exam & Pap   2) Pelvic pain, dyspareunia> will get pelvic u/s  3) Vasomotor symptoms> menopause @ 63yo, discussed Brisdelle, wants to try, rx sent. Try Samul Dada for vaginal dryness  4) H/O multiple abnormal paps> w/ h/o cervical ablation  Labs/procedures today: pap  Mammogram Aug 2021 or sooner if problems Colonoscopy 2years, or  sooner if problems  Orders Placed This Encounter  Procedures  . US PELVIS (TRANSABDOMINAL ONLY)  . US PELVIS TRANSVAGINAL NON-OB (TV ONLY)    Meds:  Meds ordered this encounter  Medications  . PARoxetine Mesylate (BRISDELLE) 7.5 MG CAPS    Sig: Take 1 capsule by mouth daily.    Dispense:  30 capsule    Refill:  11    Order Specific Question:   Supervising Provider    Answer:   Lazaro Arms [[id number]]     Follow-up: Return for 1st available, US:GYN.  Cheral Marker CNM, WHNP-BC 12/14/2018 1:55 PM

## 2018-12-14 NOTE — Patient Instructions (Signed)
Luvena vaginal cream

## 2018-12-15 ENCOUNTER — Telehealth: Payer: Self-pay | Admitting: Women's Health

## 2018-12-15 ENCOUNTER — Ambulatory Visit (INDEPENDENT_AMBULATORY_CARE_PROVIDER_SITE_OTHER): Payer: BC Managed Care – PPO

## 2018-12-15 DIAGNOSIS — R102 Pelvic and perineal pain unspecified side: Secondary | ICD-10-CM

## 2018-12-15 DIAGNOSIS — N941 Unspecified dyspareunia: Secondary | ICD-10-CM | POA: Diagnosis not present

## 2018-12-15 NOTE — Telephone Encounter (Signed)
Patient presented to the office today and stated that the pharmacy stated that the [facility] requires a prior authorization.  She wanted to see if we had samples, we didn't.  She has tried using GoodRx and it didn't bring the cost down enough.  She would like for [place] to do the prior authorization to see if this helps.  She stated that she needs name brand, generic doesn't work for her w/medications.  If this doesn't help, she would like to see if there's another medication.  [phone]

## 2018-12-15 NOTE — Progress Notes (Signed)
PELVIC US TA/TV: heterogeneous, atrophic, anteverted uterus, fundal right intramural fibroid 1.2 x 1.1 x 1 cm,EEC 2 mm,normal ovaries bilat,ovaries appear mobile,no free fluid,no pain during ultrasound

## 2018-12-16 ENCOUNTER — Other Ambulatory Visit: Payer: Self-pay | Admitting: Women's Health

## 2018-12-16 DIAGNOSIS — M797 Fibromyalgia: Secondary | ICD-10-CM | POA: Diagnosis not present

## 2018-12-16 DIAGNOSIS — S91301A Unspecified open wound, right foot, initial encounter: Secondary | ICD-10-CM | POA: Diagnosis not present

## 2018-12-16 DIAGNOSIS — G43109 Migraine with aura, not intractable, without status migrainosus: Secondary | ICD-10-CM | POA: Diagnosis not present

## 2018-12-16 DIAGNOSIS — F411 Generalized anxiety disorder: Secondary | ICD-10-CM | POA: Diagnosis not present

## 2018-12-16 MED ORDER — PAROXETINE HCL 10 MG PO TABS: 10.0000 mg | ORAL_TABLET | Freq: Every day | ORAL | 6 refills | Status: DC

## 2018-12-19 ENCOUNTER — Encounter: Payer: Self-pay | Admitting: "Endocrinology

## 2018-12-19 ENCOUNTER — Ambulatory Visit (INDEPENDENT_AMBULATORY_CARE_PROVIDER_SITE_OTHER): Payer: BC Managed Care – PPO | Admitting: "Endocrinology

## 2018-12-19 ENCOUNTER — Other Ambulatory Visit: Payer: Self-pay

## 2018-12-19 DIAGNOSIS — E890 Postprocedural hypothyroidism: Secondary | ICD-10-CM | POA: Diagnosis not present

## 2018-12-19 LAB — CYTOLOGY - PAP
Chlamydia: NEGATIVE
Diagnosis: NEGATIVE
High risk HPV: NEGATIVE
Neisseria Gonorrhea: NEGATIVE

## 2018-12-19 MED ORDER — SYNTHROID 112 MCG PO TABS: 112.0000 ug | ORAL_TABLET | Freq: Every day | ORAL | 3 refills | Status: DC

## 2018-12-19 NOTE — Progress Notes (Signed)
12/19/2018, 4:03 PM                                 Endocrinology Telehealth Visit Follow up Note -During COVID -19 Pandemic  I connected with Katie Davis on 12/19/2018   by telephone and verified that I am speaking with the correct person using two identifiers. Katie Davis, 12-Aug-1955. she has verbally consented to this visit. All issues noted in this document were discussed and addressed. The format was not optimal for physical exam.  Subjective:    Patient ID: Katie Davis, female    DOB: [birth year], PCP Celene Squibb, MD   Past Medical History:  Diagnosis Date  . Abnormal glandular Papanicolaou smear of cervix   . Anxiety   . Body mass index 30.0-30.9, adult   . Celiac disease   . Chronic vascular disorder of intestine ([place])   . Fibromyalgia   . Graves disease   . Headache    Migrains  . Hyperlipidemia   . Hypertension   . Hypothyroidism   . IBS (irritable bowel syndrome) With constipation   . Obesity, unspecified   . Osteoporosis   . Raynaud disease   . Sjogren's disease [facility])    Past Surgical History:  Procedure Laterality Date  . CHOLECYSTECTOMY    . REDUCTION MAMMAPLASTY     Social History   Socioeconomic History  . Marital status: Unknown    Spouse name: Not on file  . Number of children: Not on file  . Years of education: Not on file  . Highest education level: Not on file  Occupational History  . Not on file  Social Needs  . Financial resource strain: Not on file  . Food insecurity    Worry: Not on file    Inability: Not on file  . Transportation needs    Medical: Not on file    Non-medical: Not on file  Tobacco Use  . Smoking status: Never Smoker  . Smokeless tobacco: Never Used  Substance and Sexual Activity  . Alcohol use: Never    Frequency: Never  . Drug use: Never  . Sexual activity: Yes    Birth control/protection: None  Lifestyle  . Physical activity    Days per week: Not on file     Minutes per session: Not on file  . Stress: Not on file  Relationships  . Social Herbalist on phone: Not on file    Gets together: Not on file    Attends religious service: Not on file    Active member of club or organization: Not on file    Attends meetings of clubs or organizations: Not on file    Relationship status: Not on file  Other Topics Concern  . Not on file  Social History Narrative  . Not on file   History reviewed. No pertinent family history. Outpatient Encounter Medications as of 12/19/2018  Medication Sig  . albuterol (VENTOLIN HFA) 108 (90 Base) MCG/ACT inhaler Inhale into the lungs.  Marland Kitchen albuterol (VENTOLIN HFA) 108 (90 Base) MCG/ACT inhaler   . Ascorbic Acid (VITAMIN C) 500 MG CHEW Chew by mouth.  Marland Kitchen atenolol (TENORMIN)  50 MG tablet Take 50 mg by mouth 2 (two) times daily.  Marland Kitchen. B-Complex TABS Take by mouth.  . Cholecalciferol (VITAMIN D3) 50 MCG (2000 UT) capsule Take by mouth.  . clonazePAM (KLONOPIN) 0.5 MG tablet Take 0.5 mg by mouth daily.  . fluticasone (FLOVENT HFA) 110 MCG/ACT inhaler Inhale 1 puff into the lungs 2 (two) times daily.  Marland Kitchen. gabapentin (NEURONTIN) 100 MG capsule Take 100 mg by mouth at bedtime.  Marland Kitchen. ibuprofen (ADVIL,MOTRIN) 800 MG tablet Take 800 mg by mouth 3 (three) times daily.   . meclizine (ANTIVERT) 12.5 MG tablet Take 12.5-25 mg by mouth every 6 (six) hours.  . Omega-3 1000 MG CAPS Take by mouth.  Marland Kitchen. omeprazole (PRILOSEC) 20 MG capsule Take 20 mg by mouth 2 (two) times daily before a meal.  . ondansetron (ZOFRAN) 4 MG tablet Take 4 mg by mouth every 8 (eight) hours as needed for nausea or vomiting.  Marland Kitchen. oxyCODONE-acetaminophen (PERCOCET/ROXICET) 5-325 MG tablet Take by mouth every 6 (six) hours as needed for severe pain.  Marland Kitchen. PARoxetine (PAXIL) 10 MG tablet Take 1 tablet (10 mg total) by mouth daily.  Marland Kitchen. PARoxetine Mesylate (BRISDELLE) 7.5 MG CAPS Take 1 capsule by mouth daily.  . rizatriptan (MAXALT) 10 MG tablet Take by mouth.  .  SYNTHROID 112 MCG tablet Take 1 tablet (112 mcg total) by mouth daily before breakfast.  . [DISCONTINUED] levothyroxine (SYNTHROID) 112 MCG tablet Take 112 mcg by mouth daily before breakfast.   No facility-administered encounter medications on file as of 12/19/2018.    ALLERGIES: Allergies  Allergen Reactions  . Bactrim [Sulfamethoxazole-Trimethoprim] Nausea And Vomiting  . Gluten Meal     Ceiliac disease   . Keflex [Cephalexin]   . Latex Hives  . Levaquin [Levofloxacin] Nausea And Vomiting  . Sulfa Antibiotics Nausea And Vomiting  . Contrast Media [Iodinated Diagnostic Agents] Nausea And Vomiting and Rash    VACCINATION STATUS:  There is no immunization history on file for this patient.  HPI Katie Hallatricia Mabie is 63 y.o. female who presents today with a medical history as above. she is being engaged in telehealth via telephone after she was seen in consultation for post ablation hypothyroidism requested by Benita StabileHall, John Z, MD.  -History is obtained directly from the patient.  She reports that she underwent 2 doses of I-131 to ablate her thyroid due to Graves' disease in year 2000.  She was given thyroid hormone replacement at various doses ranging from 100 to 175 mcg over the years.   [place] in [place], patient moved to this area recently.  She is currently on Synthroid 112 mcg p.o. every morning 6 days a week, and 174 micrograms (11/2 pill)  1 day a week.  She is compliant with her medication taking it every day with water in the morning half an hour before other medications or food.  -Her recent thyroid function tests are consistent with appropriate replacement. She does not have acute symptoms, denies palpitations, tremors, heat/cold intolerance.  She has medical history of celiac disease, Sjogren's syndrome. -She has family history of Hashimoto's thyroiditis in her sister, no history of goiter nor family history of thyroid malignancy.   Review of  Systems  Limited as above. Objective:    There were no vitals taken for this visit.  Wt Readings from Last 3 Encounters:  12/09/18 171 lb 9.6 oz (77.8 kg)  11/16/18 173 lb (78.5 kg)    Physical Exam    Jul 20, 2018 TSH  0.935 Recent Results (from the past 2160 hour(s))  TSH     Status: None   Collection Time: 12/09/18 12:00 AM  Result Value Ref Range   TSH 1.31 0.41 - 5.90    Comment: free t4 1.38    Assessment & Plan:   1. Hypothyroidism following radioiodine therapy -Based on review of her thyroid records from [place]state facilities, she has post ablative hypothyroidism related to her history of Graves' disease which required 2 doses of I-131 in the year 2000. -Her most recent thyroid function tests are consistent with appropriate replacement.    -  she is advised to continue Synthroid 112 mcg p.o. daily before breakfast 6 days a week, and 1 1/2 pill 1 daily.  - We discussed about the correct intake of her thyroid hormone, on empty stomach at fasting, with water, separated by at least 30 minutes from breakfast and other medications,  and separated by more than 4 hours from calcium, iron, multivitamins, acid reflux medications (PPIs). -Patient is made aware of the fact that thyroid hormone replacement is needed for life, dose to be adjusted by periodic monitoring of thyroid function tests.  - I advised her  to maintain close follow up with Benita Stabile, MD for primary care needs.  Time for this visit: 15 minutes. Katie Davis  participated in the discussions, expressed understanding, and voiced agreement with the above plans.  All questions were answered to her satisfaction. she is encouraged to contact clinic should she have any questions or concerns prior to her return visit.   Follow up plan: Return in about 6 months (around 06/19/2019) for Follow up with Pre-visit Labs.   Marquis Lunch, MD [facility] [address] [place], [place] [address] Phone: [phone]  Fax: [phone]     12/19/2018, 4:03 PM  This note was partially dictated with voice recognition software. Similar sounding words can be transcribed inadequately or may not  be corrected upon review.

## 2019-01-03 ENCOUNTER — Other Ambulatory Visit: Payer: Self-pay | Admitting: Women's Health

## 2019-01-03 ENCOUNTER — Telehealth: Payer: Self-pay | Admitting: *Deleted

## 2019-01-03 MED ORDER — PAROXETINE HCL 10 MG PO TABS: 5.0000 mg | ORAL_TABLET | Freq: Every day | ORAL | 11 refills | Status: DC

## 2019-01-03 MED ORDER — PAROXETINE MESYLATE 7.5 MG PO CAPS: 1.0000 | ORAL_CAPSULE | Freq: Every day | ORAL | 11 refills | Status: DC

## 2019-01-03 NOTE — Telephone Encounter (Signed)
Received message that [employer] needs PA. Form to be faxed to office.

## 2019-01-04 NOTE — Telephone Encounter (Signed)
PA was submitted via [email or web address]. Awaiting response from insurance company.

## 2019-01-09 ENCOUNTER — Encounter: Payer: Self-pay | Admitting: *Deleted

## 2019-01-10 ENCOUNTER — Other Ambulatory Visit: Payer: Self-pay | Admitting: Women's Health

## 2019-01-10 MED ORDER — PAXIL 10 MG PO TABS: 10.0000 mg | ORAL_TABLET | Freq: Every day | ORAL | 6 refills | Status: DC

## 2019-01-10 MED ORDER — PAROXETINE HCL 10 MG PO TABS: 10.0000 mg | ORAL_TABLET | Freq: Every day | ORAL | 6 refills | Status: DC

## 2019-01-11 ENCOUNTER — Other Ambulatory Visit: Payer: Self-pay | Admitting: Women's Health

## 2019-01-20 ENCOUNTER — Other Ambulatory Visit: Payer: Self-pay | Admitting: "Endocrinology

## 2019-01-20 DIAGNOSIS — E890 Postprocedural hypothyroidism: Secondary | ICD-10-CM

## 2019-01-23 DIAGNOSIS — E890 Postprocedural hypothyroidism: Secondary | ICD-10-CM | POA: Diagnosis not present

## 2019-02-03 DIAGNOSIS — K5900 Constipation, unspecified: Secondary | ICD-10-CM | POA: Diagnosis not present

## 2019-02-03 DIAGNOSIS — M797 Fibromyalgia: Secondary | ICD-10-CM | POA: Diagnosis not present

## 2019-02-03 DIAGNOSIS — F411 Generalized anxiety disorder: Secondary | ICD-10-CM | POA: Diagnosis not present

## 2019-02-03 DIAGNOSIS — R1084 Generalized abdominal pain: Secondary | ICD-10-CM | POA: Diagnosis not present

## 2019-03-14 DIAGNOSIS — R87619 Unspecified abnormal cytological findings in specimens from cervix uteri: Secondary | ICD-10-CM | POA: Diagnosis not present

## 2019-03-14 DIAGNOSIS — K581 Irritable bowel syndrome with constipation: Secondary | ICD-10-CM | POA: Diagnosis not present

## 2019-03-14 DIAGNOSIS — Z20828 Contact with and (suspected) exposure to other viral communicable diseases: Secondary | ICD-10-CM | POA: Diagnosis not present

## 2019-03-14 DIAGNOSIS — M797 Fibromyalgia: Secondary | ICD-10-CM | POA: Diagnosis not present

## 2019-03-14 DIAGNOSIS — I10 Essential (primary) hypertension: Secondary | ICD-10-CM | POA: Diagnosis not present

## 2019-03-14 DIAGNOSIS — E782 Mixed hyperlipidemia: Secondary | ICD-10-CM | POA: Diagnosis not present

## 2019-03-18 ENCOUNTER — Encounter: Payer: Self-pay | Admitting: Emergency Medicine

## 2019-03-18 ENCOUNTER — Other Ambulatory Visit: Payer: Self-pay

## 2019-03-18 ENCOUNTER — Ambulatory Visit
Admission: EM | Admit: 2019-03-18 | Discharge: 2019-03-18 | Disposition: A | Discharge status: Home / Self Care | Payer: 59 | Attending: Emergency Medicine | Admitting: Emergency Medicine

## 2019-03-18 DIAGNOSIS — R110 Nausea: Secondary | ICD-10-CM | POA: Diagnosis not present

## 2019-03-18 DIAGNOSIS — R062 Wheezing: Secondary | ICD-10-CM | POA: Diagnosis not present

## 2019-03-18 DIAGNOSIS — R519 Headache, unspecified: Secondary | ICD-10-CM

## 2019-03-18 DIAGNOSIS — R5383 Other fatigue: Secondary | ICD-10-CM

## 2019-03-18 DIAGNOSIS — M791 Myalgia, unspecified site: Secondary | ICD-10-CM

## 2019-03-18 DIAGNOSIS — Z20822 Contact with and (suspected) exposure to covid-19: Secondary | ICD-10-CM

## 2019-03-18 LAB — POCT INFLUENZA A/B
Influenza A, POC: NEGATIVE
Influenza B, POC: NEGATIVE

## 2019-03-18 MED ORDER — ALBUTEROL SULFATE HFA 108 (90 BASE) MCG/ACT IN AERS: 1.0000 | INHALATION_SPRAY | Freq: Four times a day (QID) | RESPIRATORY_TRACT | 0 refills | Status: DC | PRN

## 2019-03-18 MED ORDER — BENZONATATE 100 MG PO CAPS: 100.0000 mg | ORAL_CAPSULE | Freq: Three times a day (TID) | ORAL | 0 refills | Status: DC

## 2019-03-18 MED ORDER — ONDANSETRON HCL 4 MG PO TABS: 4.0000 mg | ORAL_TABLET | Freq: Three times a day (TID) | ORAL | 0 refills | Status: DC | PRN

## 2019-03-18 NOTE — ED Provider Notes (Signed)
[facility]    CSN: [id number] Arrival date & time: 03/18/19  1041      History   Chief Complaint Chief Complaint  Patient presents with  . body aches, fatigue, wheezing    HPI Katie Davis is a 64 y.o. female.   Katie Davis 64 years old female presented to the urgent care for complaint of Covid exposure.  Report body aches, headaches,nausea, fatigue for the past 2 days.  Denies sick exposure to flu or strep.  Denies recent travel.  Denies aggravating or alleviating symptoms.  Denies previous COVID infection.   Denies fever, chills, nasal congestion, rhinorrhea, sore throat, cough, SOB, wheezing, chest pain,changes in bowel or bladder habits.    The history is provided by the patient. No language interpreter was used.    Past Medical History:  Diagnosis Date  . Abnormal glandular Papanicolaou smear of cervix   . Anxiety   . Body mass index 30.0-30.9, adult   . Celiac disease   . Chronic vascular disorder of intestine ([facility])   . Fibromyalgia   . Graves disease   . Headache    Migrains  . Hyperlipidemia   . Hypertension   . Hypothyroidism   . IBS (irritable bowel syndrome) With constipation   . Obesity, unspecified   . Osteoporosis   . Raynaud disease   . Sjogren's disease [facility])     Patient Active Problem List   Diagnosis Date Noted  . Postmenopausal 12/14/2018  . Vasomotor symptoms due to menopause 12/14/2018  . History of abnormal cervical Pap smear 12/14/2018    Past Surgical History:  Procedure Laterality Date  . CHOLECYSTECTOMY    . REDUCTION MAMMAPLASTY      OB History    Gravida  3   Para      Term      Preterm      AB  3   Living  0     SAB  3   TAB      Ectopic      Multiple      Live Births               Home Medications    Prior to Admission medications   Medication Sig Start Date End Date Taking? Authorizing Provider  albuterol (VENTOLIN HFA) 108 (90 Base) MCG/ACT inhaler Inhale into the lungs.  05/04/15   [provider]  albuterol (VENTOLIN HFA) 108 (90 Base) MCG/ACT inhaler  08/05/18   [provider]  Ascorbic Acid (VITAMIN C) 500 MG CHEW Chew by mouth.    [provider]  atenolol (TENORMIN) 50 MG tablet Take 50 mg by mouth 2 (two) times daily.    [provider]  B-Complex TABS Take by mouth.    [provider]  Cholecalciferol (VITAMIN D3) 50 MCG (2000 UT) capsule Take by mouth.    [provider]  clonazePAM (KLONOPIN) 0.5 MG tablet Take 0.5 mg by mouth daily.    [provider]  fluticasone (FLOVENT HFA) 110 MCG/ACT inhaler Inhale 1 puff into the lungs 2 (two) times daily.    [provider]  gabapentin (NEURONTIN) 100 MG capsule Take 100 mg by mouth at bedtime.    [provider]  ibuprofen (ADVIL,MOTRIN) 800 MG tablet Take 800 mg by mouth 3 (three) times daily.     [provider]  meclizine (ANTIVERT) 12.5 MG tablet Take 12.5-25 mg by mouth every 6 (six) hours.    Benita Stabile, MD  Omega-3 1000 MG CAPS Take by mouth.    [provider]  omeprazole (PRILOSEC) 20 MG capsule Take 20 mg by mouth 2 (two) times daily before a meal.    [provider]  ondansetron (ZOFRAN) 4 MG tablet Take 1 tablet (4 mg total) by mouth every 8 (eight) hours as needed for nausea or vomiting. 03/18/19   Bartosz Luginbill, Zachery Dakins, FNP  oxyCODONE-acetaminophen (PERCOCET/ROXICET) 5-325 MG tablet Take by mouth every 6 (six) hours as needed for severe pain.    [provider]  PAXIL 10 MG tablet Take 1 tablet (10 mg total) by mouth daily. 01/10/19   Cheral Marker, CNM  rizatriptan (MAXALT) 10 MG tablet Take by mouth. 01/08/17   [provider]  SYNTHROID 112 MCG tablet Take 1 tablet (112 mcg total) by mouth daily before breakfast. 12/19/18   Nida, Denman George, MD    Family History Family History  Problem Relation Age of Onset  . Healthy Mother   . Healthy Father     Social  History Social History   Tobacco Use  . Smoking status: Never Smoker  . Smokeless tobacco: Never Used  Substance Use Topics  . Alcohol use: Never  . Drug use: Never     Allergies   Bactrim [sulfamethoxazole-trimethoprim], Gluten meal, Keflex [cephalexin], Latex, Levaquin [levofloxacin], Sulfa antibiotics, and Contrast media [iodinated diagnostic agents]   Review of Systems Review of Systems  Constitutional: Positive for fatigue.  HENT: Negative.   Respiratory: Negative.   Cardiovascular: Negative.   Gastrointestinal: Positive for nausea.  Musculoskeletal:       Body-aches  Neurological: Positive for headaches.  ROS: All other are negatives  Physical Exam Triage Vital Signs ED Triage Vitals [03/18/19 1059]  Enc Vitals Group     BP 116/73     Pulse Rate 70     Resp 18     Temp 98.5 F (36.9 C)     Temp Source Oral     SpO2 96 %     Weight      Height      Head Circumference      Peak Flow      Pain Score      Pain Loc      Pain Edu?      Excl. in GC?    No data found.  Updated Vital Signs BP 116/73 (BP Location: Right Arm)   Pulse 70   Temp 98.5 F (36.9 C) (Oral)   Resp 18   SpO2 96%   Visual Acuity Right Eye Distance:   Left Eye Distance:   Bilateral Distance:    Right Eye Near:   Left Eye Near:    Bilateral Near:     Physical Exam Constitutional:      General: She is not in acute distress.    Appearance: Normal appearance. She is normal weight. She is not ill-appearing or toxic-appearing.  HENT:     Head: Normocephalic.     Right Ear: Tympanic membrane, ear canal and external ear normal. There is no impacted cerumen.     Left Ear: Tympanic membrane, ear canal and external ear normal. There is no impacted cerumen.     Nose: Nose normal. No congestion.     Mouth/Throat:     Mouth: Mucous membranes are moist.     Pharynx: No oropharyngeal exudate or posterior oropharyngeal erythema.  Cardiovascular:     Rate and Rhythm: Normal rate and  regular rhythm.  Pulses: Normal pulses.     Heart sounds: Normal heart sounds. No murmur.  Pulmonary:     Effort: Pulmonary effort is normal. No respiratory distress.     Breath sounds: No wheezing or rhonchi.  Chest:     Chest wall: No tenderness.  Abdominal:     General: Abdomen is flat. Bowel sounds are normal. There is no distension.     Palpations: There is no mass.  Skin:    Capillary Refill: Capillary refill takes less than 2 seconds.  Neurological:     Mental Status: She is alert and oriented to person, place, and time.      UC Treatments / Results  Labs (all labs ordered are listed, but only abnormal results are displayed) Labs Reviewed  NOVEL CORONAVIRUS, NAA    EKG   Radiology No results found.  Procedures Procedures (including critical care time)  Medications Ordered in UC Medications - No data to display  Initial Impression / Assessment and Plan / UC Course  I have reviewed the triage vital signs and the nursing notes.  Pertinent labs & imaging results that were available during my care of the patient were reviewed by me and considered in my medical decision making (see chart for details).   Covid test and flu test were ordered.  Flu test result was reviewed.  Patient is stable for discharge.  Benign physical exam.  Advised patient to quarantine until COVID-19 test result become available.  Work note was given.  Patient verbalized an understanding of the plan of care.  Final Clinical Impressions(s) / UC Diagnoses   Final diagnoses:  Exposure to COVID-19 virus     Discharge Instructions     Influenza test was negative COVID testing ordered.  It will take between 2-7 days for test results.  Someone will contact you regarding abnormal results.    In the meantime: You should remain isolated in your home for 10 days from symptom onset  Get plenty of rest and push fluids Use medications daily for symptom relief Use OTC medications like ibuprofen  or tylenol as needed fever or pain Call or go to the ED if you have any new or worsening symptoms such as fever, worsening cough, shortness of breath, chest tightness, chest pain, turning blue, changes in mental status, etc...      ED Prescriptions    Medication Sig Dispense Auth. Provider   ondansetron (ZOFRAN) 4 MG tablet Take 1 tablet (4 mg total) by mouth every 8 (eight) hours as needed for nausea or vomiting. 12 tablet Chonte Ricke, Darrelyn Hillock, FNP     PDMP not reviewed this encounter.   Emerson Monte, [place] 03/18/19 1127

## 2019-03-18 NOTE — ED Triage Notes (Signed)
Pt presents to UC w/ c/o fatigue, body aches, headache, nausea, diarrhea x2 days. Pt states she had a positive covid exposure 1 week ago.

## 2019-03-18 NOTE — Discharge Instructions (Addendum)
Influenza test was negative COVID testing ordered.  It will take between 2-7 days for test results.  Someone will contact you regarding abnormal results.    In the meantime: You should remain isolated in your home for 10 days from symptom onset  Get plenty of rest and push fluids Use medications daily for symptom relief Use OTC medications like ibuprofen or tylenol as needed fever or pain Call or go to the ED if you have any new or worsening symptoms such as fever, worsening cough, shortness of breath, chest tightness, chest pain, turning blue, changes in mental status, etc..Marland Kitchen

## 2019-03-20 LAB — NOVEL CORONAVIRUS, NAA: SARS-CoV-2, NAA: NOT DETECTED

## 2019-05-16 ENCOUNTER — Other Ambulatory Visit: Payer: Self-pay

## 2019-05-16 ENCOUNTER — Encounter (HOSPITAL_COMMUNITY)
Admission: RE | Admit: 2019-05-16 | Discharge: 2019-05-16 | Disposition: A | Discharge status: Home / Self Care | Payer: 59 | Source: Ambulatory Visit | Attending: Internal Medicine | Admitting: Internal Medicine

## 2019-05-16 ENCOUNTER — Encounter (HOSPITAL_COMMUNITY): Payer: Self-pay

## 2019-05-16 DIAGNOSIS — M810 Age-related osteoporosis without current pathological fracture: Secondary | ICD-10-CM | POA: Diagnosis not present

## 2019-05-16 MED ORDER — DENOSUMAB 60 MG/ML ~~LOC~~ SOSY
60.0000 mg | PREFILLED_SYRINGE | Freq: Once | SUBCUTANEOUS | Status: AC
  Administered 2019-05-16: 60 mg via SUBCUTANEOUS

## 2019-05-16 NOTE — Discharge Instructions (Signed)
Denosumab injection What is this medicine? DENOSUMAB (den oh sue mab) slows bone breakdown. Prolia is used to treat osteoporosis in women after menopause and in men, and in people who are taking corticosteroids for 6 months or more. Xgeva is used to treat a high calcium level due to cancer and to prevent bone fractures and other bone problems caused by multiple myeloma or cancer bone metastases. Xgeva is also used to treat giant cell tumor of the bone. This medicine may be used for other purposes; ask your health care provider or pharmacist if you have questions. COMMON BRAND NAME(S): Prolia, XGEVA What should I tell my health care provider before I take this medicine? They need to know if you have any of these conditions:  dental disease  having surgery or tooth extraction  infection  kidney disease  low levels of calcium or Vitamin D in the blood  malnutrition  on hemodialysis  skin conditions or sensitivity  thyroid or parathyroid disease  an unusual reaction to denosumab, other medicines, foods, dyes, or preservatives  pregnant or trying to get pregnant  breast-feeding How should I use this medicine? This medicine is for injection under the skin. It is given by a health care professional in a hospital or clinic setting. A special MedGuide will be given to you before each treatment. Be sure to read this information carefully each time. For Prolia, talk to your pediatrician regarding the use of this medicine in children. Special care may be needed. For Xgeva, talk to your pediatrician regarding the use of this medicine in children. While this drug may be prescribed for children as young as 13 years for selected conditions, precautions do apply. Overdosage: If you think you have taken too much of this medicine contact a poison control center or emergency room at once. NOTE: This medicine is only for you. Do not share this medicine with others. What if I miss a dose? It is  important not to miss your dose. Call your doctor or health care professional if you are unable to keep an appointment. What may interact with this medicine? Do not take this medicine with any of the following medications:  other medicines containing denosumab This medicine may also interact with the following medications:  medicines that lower your chance of fighting infection  steroid medicines like prednisone or cortisone This list may not describe all possible interactions. Give your health care provider a list of all the medicines, herbs, non-prescription drugs, or dietary supplements you use. Also tell them if you smoke, drink alcohol, or use illegal drugs. Some items may interact with your medicine. What should I watch for while using this medicine? Visit your doctor or health care professional for regular checks on your progress. Your doctor or health care professional may order blood tests and other tests to see how you are doing. Call your doctor or health care professional for advice if you get a fever, chills or sore throat, or other symptoms of a cold or flu. Do not treat yourself. This drug may decrease your body's ability to fight infection. Try to avoid being around people who are sick. You should make sure you get enough calcium and vitamin D while you are taking this medicine, unless your doctor tells you not to. Discuss the foods you eat and the vitamins you take with your health care professional. See your dentist regularly. Brush and floss your teeth as directed. Before you have any dental work done, tell your dentist you are   receiving this medicine. Do not become pregnant while taking this medicine or for 5 months after stopping it. Talk with your doctor or health care professional about your birth control options while taking this medicine. Women should inform their doctor if they wish to become pregnant or think they might be pregnant. There is a potential for serious side  effects to an unborn child. Talk to your health care professional or pharmacist for more information. What side effects may I notice from receiving this medicine? Side effects that you should report to your doctor or health care professional as soon as possible:  allergic reactions like skin rash, itching or hives, swelling of the face, lips, or tongue  bone pain  breathing problems  dizziness  jaw pain, especially after dental work  redness, blistering, peeling of the skin  signs and symptoms of infection like fever or chills; cough; sore throat; pain or trouble passing urine  signs of low calcium like fast heartbeat, muscle cramps or muscle pain; pain, tingling, numbness in the hands or feet; seizures  unusual bleeding or bruising  unusually weak or tired Side effects that usually do not require medical attention (report to your doctor or health care professional if they continue or are bothersome):  constipation  diarrhea  headache  joint pain  loss of appetite  muscle pain  runny nose  tiredness  upset stomach This list may not describe all possible side effects. Call your doctor for medical advice about side effects. You may report side effects to FDA at 1-800-FDA-1088. Where should I keep my medicine? This medicine is only given in a clinic, doctor's office, or other health care setting and will not be stored at home. NOTE: This sheet is a summary. It may not cover all possible information. If you have questions about this medicine, talk to your doctor, pharmacist, or health care provider.  2020 Elsevier/Gold Standard (2017-07-09 16:10:44)

## 2019-05-17 ENCOUNTER — Other Ambulatory Visit: Payer: Self-pay | Admitting: "Endocrinology

## 2019-06-23 ENCOUNTER — Ambulatory Visit: Payer: BC Managed Care – PPO | Admitting: "Endocrinology

## 2019-07-12 ENCOUNTER — Ambulatory Visit: Payer: 59 | Admitting: Women's Health

## 2019-07-14 ENCOUNTER — Ambulatory Visit (INDEPENDENT_AMBULATORY_CARE_PROVIDER_SITE_OTHER): Payer: 59 | Admitting: Women's Health

## 2019-07-14 ENCOUNTER — Other Ambulatory Visit: Payer: Self-pay | Admitting: Women's Health

## 2019-07-14 ENCOUNTER — Other Ambulatory Visit: Payer: Self-pay

## 2019-07-14 ENCOUNTER — Encounter: Payer: Self-pay | Admitting: Women's Health

## 2019-07-14 VITALS — BP 116/79 | HR 68 | Ht 63.0 in | Wt 167.0 lb

## 2019-07-14 DIAGNOSIS — R1084 Generalized abdominal pain: Secondary | ICD-10-CM | POA: Diagnosis not present

## 2019-07-14 DIAGNOSIS — R109 Unspecified abdominal pain: Secondary | ICD-10-CM

## 2019-07-14 DIAGNOSIS — R102 Pelvic and perineal pain: Secondary | ICD-10-CM | POA: Diagnosis not present

## 2019-07-14 LAB — POCT WET PREP (WET MOUNT)
Clue Cells Wet Prep Whiff POC: NEGATIVE
Trichomonas Wet Prep HPF POC: ABSENT

## 2019-07-14 MED ORDER — METRONIDAZOLE 500 MG PO TABS: 500.0000 mg | ORAL_TABLET | Freq: Two times a day (BID) | ORAL | 0 refills | Status: DC

## 2019-07-14 NOTE — Progress Notes (Signed)
GYN VISIT Patient name: Katie Davis MRN [id number]  Date of birth: 12-21-1955 Chief Complaint:   Pelvic Pain (having some cramping )  History of Present Illness:   Katie Davis is a 64 y.o. G1P0030 Caucasian female being seen today for report of generalized abdominal and pelvic cramping/pain recently.  Has had a lot of issues w/ bowels lately, not sure if it is bowels that are bothering her or possibly uterus/ovaries. Does feel like period type cramping though. No vaginal bleeding. No fever/chills. Went to PCP, ordered abd/pelvis CT but insurance said she needed abd u/s first. She pelvic u/s w/ [place] 12/15/2018 for pelvic pain/dyspareunia, showed fundal Rt intramural fibroid 1.2x1.1x1cm, small 7x56mm nabothian cyst, otherwise normal. Denies abnormal discharge, itching/odor/irritation.  Is not sexually active, husband had 2 strokes in Feb.  Depression screen PHQ 2/9 12/14/2018  Decreased Interest 0  Down, Depressed, Hopeless 0  PHQ - 2 Score 0    No LMP recorded. Patient is postmenopausal. The current method of family planning is abstinence and post menopausal status.  Last pap 12/14/18. Results were:  normal Review of Systems:   Pertinent items are noted in HPI Denies fever/chills, dizziness, headaches, visual disturbances, fatigue, shortness of breath, chest pain, abdominal pain, vomiting, abnormal vaginal discharge/itching/odor/irritation, problems with periods, bowel movements, urination, or intercourse unless otherwise stated above.  Pertinent History Reviewed:  Reviewed past medical,surgical, social, obstetrical and family history.  Reviewed problem list, medications and allergies. Physical Assessment:   Vitals:   07/14/19 1033  BP: 116/79  Pulse: 68  Weight: 167 lb (75.8 kg)  Height: 5\' 3"  (1.6 m)  Body mass index is 29.58 kg/m.       Physical Examination:   General appearance: alert, well appearing, and in no distress  Mental status: alert, oriented to person, place, and time   Skin: warm & dry   Cardiovascular: normal heart rate noted  Respiratory: normal respiratory effort, no distress  Abdomen: soft, +tenderness all over  Pelvic: VULVA: normal atrophic appearing vulva with no masses, tenderness or lesions, VAGINA: normal atrophic appearing vagina with normal color and discharge, no lesions, CERVIX: normal appearing cervix without discharge or lesions and some tenderness, UTERUS: uterus is normal size, shape, consistency and slightly tender, ADNEXA: normal adnexa in size, nontender and no masses  Extremities: no edema   Chaperone: Alice Rieger    Results for orders placed or performed in visit on 07/14/19 (from the past 24 hour(s))  POCT Wet Prep [facility])   Collection Time: 07/14/19  1:49 PM  Result Value Ref Range   Source Wet Prep POC vaginal    WBC, Wet Prep HPF POC few    Bacteria Wet Prep HPF POC Few Few   BACTERIA WET PREP MORPHOLOGY POC     Clue Cells Wet Prep HPF POC None None   Clue Cells Wet Prep Whiff POC Negative Whiff    Yeast Wet Prep HPF POC None None   KOH Wet Prep POC     Trichomonas Wet Prep HPF POC Absent Absent   EDIT: wet prep did have few clues Assessment & Plan:  1) Abdominal/pelvic pain & cramping> normal pelvic u/s w/ [place] Oct 2020, will get abd/pelvic CT w/ contrast, has had n/v & rash w/ contrast in past, so will need to pre-medicate w/ benadryl 50mg  at least 1hr prior to appointment and have someone drive her in case she gets sleepy. CMP ordered (unless PCP has done one recently). Note sent to [facility] to schedule CT  2) Few clues on wet prep> will treat w/ Flagyl to see if pelvic cramping improves  Meds:  Meds ordered this encounter  Medications  . metroNIDAZOLE (FLAGYL) 500 MG tablet    Sig: Take 1 tablet (500 mg total) by mouth 2 (two) times daily.    Dispense:  14 tablet    Refill:  0    Order Specific Question:   Supervising Provider    Answer:   Duane Lope H [[id number]]    Orders Placed This Encounter  Procedures   . CT ABDOMEN PELVIS W CONTRAST  . POCT Wet Prep [facility])    Return for Oct for physical.  Cheral Marker CNM, [facility] 07/14/2019 1:53 PM

## 2019-07-17 ENCOUNTER — Other Ambulatory Visit: Payer: Self-pay | Admitting: Women's Health

## 2019-07-17 DIAGNOSIS — R102 Pelvic and perineal pain: Secondary | ICD-10-CM

## 2019-07-17 DIAGNOSIS — R1084 Generalized abdominal pain: Secondary | ICD-10-CM

## 2019-07-17 NOTE — Addendum Note (Signed)
Addended by: Cheral Marker on: 07/17/2019 04:17 PM   Modules accepted: Orders

## 2019-07-26 ENCOUNTER — Ambulatory Visit (HOSPITAL_COMMUNITY)
Admission: RE | Admit: 2019-07-26 | Discharge: 2019-07-26 | Disposition: A | Discharge status: Home / Self Care | Payer: 59 | Source: Ambulatory Visit | Attending: Women's Health | Admitting: Women's Health

## 2019-07-26 ENCOUNTER — Other Ambulatory Visit: Payer: Self-pay

## 2019-07-26 DIAGNOSIS — R102 Pelvic and perineal pain: Secondary | ICD-10-CM | POA: Diagnosis present

## 2019-07-26 DIAGNOSIS — R1084 Generalized abdominal pain: Secondary | ICD-10-CM | POA: Diagnosis not present

## 2019-07-31 ENCOUNTER — Other Ambulatory Visit: Payer: Self-pay | Admitting: Women's Health

## 2019-07-31 MED ORDER — METRONIDAZOLE 500 MG PO TABS: 500.0000 mg | ORAL_TABLET | Freq: Two times a day (BID) | ORAL | 0 refills | Status: DC

## 2019-08-02 ENCOUNTER — Ambulatory Visit (HOSPITAL_COMMUNITY): Payer: 59

## 2019-08-15 ENCOUNTER — Encounter: Payer: Self-pay | Admitting: Nurse Practitioner

## 2019-10-08 ENCOUNTER — Other Ambulatory Visit: Payer: Self-pay | Admitting: Women's Health

## 2019-10-24 ENCOUNTER — Other Ambulatory Visit: Payer: Self-pay

## 2019-10-24 ENCOUNTER — Encounter: Payer: Self-pay | Admitting: *Deleted

## 2019-10-24 ENCOUNTER — Encounter: Payer: Self-pay | Admitting: Nurse Practitioner

## 2019-10-24 ENCOUNTER — Telehealth: Payer: Self-pay | Admitting: *Deleted

## 2019-10-24 ENCOUNTER — Ambulatory Visit (INDEPENDENT_AMBULATORY_CARE_PROVIDER_SITE_OTHER): Payer: 59 | Admitting: Nurse Practitioner

## 2019-10-24 DIAGNOSIS — R1084 Generalized abdominal pain: Secondary | ICD-10-CM | POA: Diagnosis not present

## 2019-10-24 DIAGNOSIS — Z0000 Encounter for general adult medical examination without abnormal findings: Secondary | ICD-10-CM | POA: Diagnosis not present

## 2019-10-24 NOTE — Telephone Encounter (Signed)
Checked [facility] website and no PA is required for TCS

## 2019-10-24 NOTE — Patient Instructions (Signed)
Your health issues we discussed today were:   Multiple GI issues including abdominal pain, IBS, celiac: 1. Continue your current regimen 2. Let us know if you have any worsening symptoms 3. We will follow-up in a few months to establish your baseline and evaluate for any needed change in medications  Need for colonoscopy: 1. We will request her previous colonoscopy report from [place] 2. We will schedule your colonoscopy for you 3. Further recommendations will be made after colonoscopy  Overall I recommend:  1. Continue your other current medications 2. Return for follow-up in 3 months 3. Call us with any questions or concerns   ---------------------------------------------------------------  I am glad you have gotten your COVID-19 vaccination!  Even though you are fully vaccinated you should continue to follow CDC and state/local guidelines.  ---------------------------------------------------------------   At [facility] we value your feedback. You may receive a survey about your visit today. Please share your experience as we strive to create trusting relationships with our patients to provide genuine, compassionate, quality care.  We appreciate your understanding and patience as we review any laboratory studies, imaging, and other diagnostic tests that are ordered as we care for you. Our office policy is 5 business days for review of these results, and any emergent or urgent results are addressed in a timely manner for your best interest. If you do not hear from our office in 1 week, please contact us.   We also encourage the use of MyChart, which contains your medical information for your review as well. If you are not enrolled in this feature, an access code is on this after visit summary for your convenience. Thank you for allowing [place] to be involved in your care.  It was great to see you today!  I hope you have a great end of Summer!!

## 2019-10-24 NOTE — Assessment & Plan Note (Addendum)
The patient states her last colonoscopy was 10 years ago in [place], found a single small polyp which was "generally normal".  They recommended a 10-year repeat.  She has signed release of records papers for [place] to request her previous colonoscopy.  She is currently due for repeat exam.  This point we will proceed with colonoscopy for screening/surveillance.  Proceed with TCS with Dr. Abbey Chatters on propofol/MAC in near future: the risks, benefits, and alternatives have been discussed with the patient in detail. The patient states understanding and desires to proceed.  ASA II  The patient is currently on Klonopin, Flexeril, oxycodone. The patient is not on any other anticoagulants, anxiolytics, chronic pain medications, antidepressants, antidiabetics, or iron supplements.

## 2019-10-24 NOTE — Progress Notes (Signed)
Primary Care Physician:  Celene Squibb, MD Primary Gastroenterologist:  Dr. Abbey Chatters  Chief Complaint  Patient presents with  . Colonoscopy    last tcs 10 yrs ago in [place].    HPI:   Katie Davis is a 64 y.o. female who presents in referral from primary care to schedule colonoscopy.  Nurse/phone triage was deferred office visit due to medications likely requiring MAC sedation.  No history of colonoscopy in our system.  Today she states she's doing ok overall. She has other "GI issues" but doing ok with those and just needing a colonoscopy. She states she had a normal colonoscopy (small polyp) 10 years ago in [place], records release has been signed to obtain a copy. History of IBS and diverticulosis. Also with celiac disease. Has variable constipation alternating with diarrhea; uses MiraLAX daily because it's gentle. Has history of hemorrhoids with her constipation and has "once in a blue moon" tissue hematochezia. Has a hard time with medications. 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 States she's a "severe celiac" and when she has contamination will often end up in the hospital cause of the retching and pulled muscles." Has fibromyalgia. Denies URI or flu-like symptoms. Denies loss of sense of taste or smell. The patient has received COVID-19 vaccination(s). Denies chest pain, dyspnea, dizziness, lightheadedness, syncope, near syncope. Denies any other upper or lower GI symptoms.  Past Medical History:  Diagnosis Date  . Abnormal glandular Papanicolaou smear of cervix   . Anxiety   . Body mass index 30.0-30.9, adult   . Celiac disease   . Chronic vascular disorder of intestine ([place])   . Fibromyalgia   . Graves disease   . Headache    Migrains  .  Hyperlipidemia   . Hypertension   . Hypothyroidism   . IBS (irritable bowel syndrome) With constipation   . Obesity, unspecified   . Osteoporosis   . Raynaud disease   . Sjogren's disease [facility])     Past Surgical History:  Procedure Laterality Date  . APPENDECTOMY    . CHOLECYSTECTOMY    . REDUCTION MAMMAPLASTY      Current Outpatient Medications  Medication Sig Dispense Refill  . albuterol (VENTOLIN HFA) 108 (90 Base) MCG/ACT inhaler Inhale 1-2 puffs into the lungs every 6 (six) hours as needed for wheezing or shortness of breath. 18 g 0  . Ascorbic Acid (VITAMIN C) 500 MG CHEW Chew by mouth daily.     Marland Kitchen atenolol (TENORMIN) 50 MG tablet Take 50 mg by mouth 2 (two) times daily.    Marland Kitchen B-Complex TABS Take by mouth daily.     . Cholecalciferol (VITAMIN D3) 50 MCG (2000 UT) capsule Take by mouth daily.     . clonazePAM (KLONOPIN) 0.5 MG tablet Take 0.5 mg by mouth as needed.     . cyclobenzaprine (FLEXERIL) 10 MG tablet Take 10 mg by mouth 3 (three) times daily.    . fluticasone (FLOVENT HFA) 110 MCG/ACT inhaler Inhale 1 puff into the lungs 2 (two) times daily.    Marland Kitchen ibuprofen (ADVIL,MOTRIN) 800 MG tablet Take 800 mg by mouth every 8 (eight) hours as needed.     . meclizine (ANTIVERT) 12.5 MG tablet Take 12.5-25 mg by mouth as needed.     Marland Kitchen  Omega-3 1000 MG CAPS Take by mouth daily.     . ondansetron (ZOFRAN) 4 MG tablet Take 1 tablet (4 mg total) by mouth every 8 (eight) hours as needed for nausea or vomiting. 12 tablet 0  . oxyCODONE-acetaminophen (PERCOCET/ROXICET) 5-325 MG tablet Take by mouth every 6 (six) hours as needed for severe pain.    Marland Kitchen SYNTHROID 125 MCG tablet Take 125 mcg by mouth daily.     No current facility-administered medications for this visit.    Allergies as of 10/24/2019 - Review Complete 10/24/2019  Allergen Reaction Noted  . Bactrim [sulfamethoxazole-trimethoprim] Nausea And Vomiting 11/16/2018  . Gluten meal  11/16/2018  . Keflex [cephalexin]  12/09/2018    . Latex Hives 11/16/2018  . Levaquin [levofloxacin] Nausea And Vomiting 11/16/2018  . Sulfa antibiotics Nausea And Vomiting 11/16/2018  . Contrast media [iodinated diagnostic agents] Nausea And Vomiting and Rash 11/16/2018    Family History  Problem Relation Age of Onset  . Healthy Mother   . Healthy Father   . Colon cancer Neg Hx     Social History   Socioeconomic History  . Marital status: Married    Spouse name: Not on file  . Number of children: Not on file  . Years of education: Not on file  . Highest education level: Not on file  Occupational History  . Not on file  Tobacco Use  . Smoking status: Never Smoker  . Smokeless tobacco: Never Used  Vaping Use  . Vaping Use: Never used  Substance and Sexual Activity  . Alcohol use: Yes    Comment: rare/social (glass of champagnae 1-2/year at celebrations)  . Drug use: Never  . Sexual activity: Yes    Birth control/protection: None  Other Topics Concern  . Not on file  Social History Narrative  . Not on file   Social Determinants of Health   Financial Resource Strain:   . Difficulty of Paying Living Expenses:   Food Insecurity:   . Worried About Charity fundraiser in the Last Year:   . Arboriculturist in the Last Year:   Transportation Needs:   . Film/video editor (Medical):   Marland Kitchen Lack of Transportation (Non-Medical):   Physical Activity:   . Days of Exercise per Week:   . Minutes of Exercise per Session:   Stress:   . Feeling of Stress :   Social Connections:   . Frequency of Communication with Friends and Family:   . Frequency of Social Gatherings with Friends and Family:   . Attends Religious Services:   . Active Member of Clubs or Organizations:   . Attends Archivist Meetings:   Marland Kitchen Marital Status:   Intimate Partner Violence:   . Fear of Current or Ex-Partner:   . Emotionally Abused:   Marland Kitchen Physically Abused:   . Sexually Abused:     Subjective: Review of Systems  Constitutional:  Negative for chills, fever, malaise/fatigue and weight loss.  HENT: Negative for congestion and sore throat.   Respiratory: Negative for cough and shortness of breath.   Cardiovascular: Negative for chest pain and palpitations.  Gastrointestinal: Positive for abdominal pain, blood in stool (rare; known hemorrhoids), constipation and nausea. Negative for diarrhea, melena and vomiting.  Musculoskeletal: Positive for joint pain and myalgias.       Fibromyalgia, generalized pain  Skin: Negative for rash.  Neurological: Negative for dizziness and weakness.  Endo/Heme/Allergies: Does not bruise/bleed easily.  Psychiatric/Behavioral: Negative for depression. The patient  is not nervous/anxious.   All other systems reviewed and are negative.      Objective: BP 113/78   Pulse 66   Temp (!) 96.9 F (36.1 C) (Temporal)   Ht _0  (1.6 m)   Wt 164 lb 9.6 oz (74.7 kg)   BMI 29.16 kg/m  Physical Exam Vitals and nursing note reviewed.  Constitutional:      General: She is not in acute distress.    Appearance: Normal appearance. She is well-developed and normal weight. She is not ill-appearing, toxic-appearing or diaphoretic.  HENT:     Head: Normocephalic and atraumatic.     Nose: No congestion or rhinorrhea.  Eyes:     General: No scleral icterus. Cardiovascular:     Rate and Rhythm: Normal rate and regular rhythm.     Heart sounds: Normal heart sounds.  Pulmonary:     Effort: Pulmonary effort is normal. No respiratory distress.     Breath sounds: Normal breath sounds.  Abdominal:     General: Abdomen is flat. Bowel sounds are normal.     Palpations: Abdomen is soft. There is no hepatomegaly, splenomegaly or mass.     Tenderness: There is abdominal tenderness (mild generalized discomfort). There is no guarding or rebound.     Hernia: No hernia is present.  Skin:    General: Skin is warm and dry.     Coloration: Skin is not jaundiced.     Findings: No rash.  Neurological:      General: No focal deficit present.     Mental Status: She is alert and oriented to person, place, and time.  Psychiatric:        Attention and Perception: Attention normal.        Mood and Affect: Mood normal.        Speech: Speech normal.        Behavior: Behavior normal.        Thought Content: Thought content normal.        Cognition and Memory: Cognition and memory normal.       10/24/2019 8:44 AM   Disclaimer: This note was dictated with voice recognition software. Similar sounding words can inadvertently be transcribed and may not be corrected upon review.

## 2019-10-24 NOTE — Assessment & Plan Note (Signed)
The patient notes waxing and waning abdominal discomfort with history of a litany of GI issues including celiac disease, IBS, chronic pain syndrome, etc.  Overall her abdominal discomfort is quite mild today.  We will schedule a follow-up to address all of her chronic GI complaints.  Recent CT of the abdomen and pelvis was normal.  Notify of any worsening abdominal pain in the interim.

## 2019-10-27 ENCOUNTER — Telehealth: Payer: Self-pay

## 2019-10-27 NOTE — Telephone Encounter (Signed)
Routing to Susan. 

## 2019-10-27 NOTE — Telephone Encounter (Signed)
VM received. Pt let the fax number for her previous Gastro office. Wynne Dust wanted to get previous records at pts ov 10/24/19. fax [phone]

## 2019-10-31 ENCOUNTER — Other Ambulatory Visit: Payer: Self-pay

## 2019-10-31 ENCOUNTER — Other Ambulatory Visit (HOSPITAL_COMMUNITY)
Admission: RE | Admit: 2019-10-31 | Discharge: 2019-10-31 | Disposition: A | Discharge status: Home / Self Care | Payer: 59 | Source: Ambulatory Visit | Attending: Internal Medicine | Admitting: Internal Medicine

## 2019-11-01 NOTE — Telephone Encounter (Signed)
Spoke with patient and faxed release. Records have been sent and placed in Wynne Dust, NP box.

## 2019-11-07 ENCOUNTER — Telehealth: Payer: Self-pay | Admitting: Internal Medicine

## 2019-11-07 NOTE — Telephone Encounter (Signed)
Called patient and she states she has some things going on and wants to cancel for now. Did not want to r/s. Endo is aware. FYI to [employer]

## 2019-11-07 NOTE — Telephone Encounter (Signed)
PATIENT CALLED TO CANCEL PROCEDURE

## 2019-11-08 ENCOUNTER — Other Ambulatory Visit (HOSPITAL_COMMUNITY)
Admission: RE | Admit: 2019-11-08 | Discharge: 2019-11-08 | Disposition: A | Discharge status: Home / Self Care | Payer: 59 | Source: Ambulatory Visit | Attending: Internal Medicine | Admitting: Internal Medicine

## 2019-11-08 NOTE — Telephone Encounter (Signed)
Noted  

## 2019-11-09 ENCOUNTER — Encounter (HOSPITAL_COMMUNITY): Admission: RE | Payer: Self-pay | Source: Home / Self Care

## 2019-11-09 ENCOUNTER — Ambulatory Visit (HOSPITAL_COMMUNITY): Admission: RE | Admit: 2019-11-09 | Payer: 59 | Source: Home / Self Care

## 2019-11-09 SURGERY — COLONOSCOPY WITH PROPOFOL
Anesthesia: Monitor Anesthesia Care

## 2019-11-14 ENCOUNTER — Other Ambulatory Visit (HOSPITAL_COMMUNITY): Payer: Self-pay | Admitting: Adult Health Nurse Practitioner

## 2019-11-14 DIAGNOSIS — Z1231 Encounter for screening mammogram for malignant neoplasm of breast: Secondary | ICD-10-CM

## 2019-11-15 ENCOUNTER — Inpatient Hospital Stay (HOSPITAL_COMMUNITY): Admission: RE | Admit: 2019-11-15 | Payer: 59 | Source: Ambulatory Visit

## 2019-11-16 ENCOUNTER — Encounter (HOSPITAL_COMMUNITY): Payer: 59

## 2019-11-23 ENCOUNTER — Ambulatory Visit (HOSPITAL_COMMUNITY)
Admission: RE | Admit: 2019-11-23 | Discharge: 2019-11-23 | Disposition: A | Discharge status: Home / Self Care | Payer: 59 | Source: Ambulatory Visit | Attending: Adult Health Nurse Practitioner | Admitting: Adult Health Nurse Practitioner

## 2019-11-23 ENCOUNTER — Other Ambulatory Visit: Payer: Self-pay

## 2019-11-23 DIAGNOSIS — Z1231 Encounter for screening mammogram for malignant neoplasm of breast: Secondary | ICD-10-CM

## 2020-01-17 ENCOUNTER — Other Ambulatory Visit: Payer: Self-pay | Admitting: Internal Medicine

## 2020-01-17 ENCOUNTER — Other Ambulatory Visit (HOSPITAL_COMMUNITY): Payer: Self-pay | Admitting: Internal Medicine

## 2020-01-17 DIAGNOSIS — E0590 Thyrotoxicosis, unspecified without thyrotoxic crisis or storm: Secondary | ICD-10-CM

## 2020-01-19 ENCOUNTER — Ambulatory Visit (HOSPITAL_COMMUNITY): Payer: 59

## 2020-01-24 ENCOUNTER — Other Ambulatory Visit: Payer: Self-pay

## 2020-01-24 ENCOUNTER — Ambulatory Visit: Payer: 59 | Admitting: Nurse Practitioner

## 2020-01-24 ENCOUNTER — Ambulatory Visit (HOSPITAL_COMMUNITY)
Admission: RE | Admit: 2020-01-24 | Discharge: 2020-01-24 | Disposition: A | Discharge status: Home / Self Care | Payer: 59 | Source: Ambulatory Visit | Attending: Internal Medicine | Admitting: Internal Medicine

## 2020-01-24 DIAGNOSIS — E0590 Thyrotoxicosis, unspecified without thyrotoxic crisis or storm: Secondary | ICD-10-CM | POA: Insufficient documentation

## 2020-02-06 ENCOUNTER — Encounter: Payer: Self-pay | Admitting: Women's Health

## 2020-02-06 ENCOUNTER — Telehealth: Payer: Self-pay | Admitting: Internal Medicine

## 2020-02-06 ENCOUNTER — Other Ambulatory Visit: Payer: Self-pay

## 2020-02-06 ENCOUNTER — Ambulatory Visit (INDEPENDENT_AMBULATORY_CARE_PROVIDER_SITE_OTHER): Payer: 59 | Admitting: Women's Health

## 2020-02-06 ENCOUNTER — Other Ambulatory Visit (HOSPITAL_COMMUNITY)
Admission: RE | Admit: 2020-02-06 | Discharge: 2020-02-06 | Disposition: A | Discharge status: Home / Self Care | Payer: 59 | Source: Ambulatory Visit | Attending: Obstetrics & Gynecology | Admitting: Obstetrics & Gynecology

## 2020-02-06 VITALS — BP 118/84 | HR 64 | Ht 63.0 in | Wt 165.2 lb

## 2020-02-06 DIAGNOSIS — Z01419 Encounter for gynecological examination (general) (routine) without abnormal findings: Secondary | ICD-10-CM | POA: Insufficient documentation

## 2020-02-06 DIAGNOSIS — R102 Pelvic and perineal pain: Secondary | ICD-10-CM

## 2020-02-06 NOTE — Telephone Encounter (Signed)
Pt will need OV. She was last seen 10/2019.

## 2020-02-06 NOTE — Progress Notes (Signed)
WELL-WOMAN Breast & Pap smear only  Patient name: Katie Davis MRN [id number]  Date of birth: Apr 30, 1955 Chief Complaint:   Gynecologic Exam  History of Present Illness:   Katie Davis is a 64 y.o. Caucasian female being seen today for a routine well-woman exam.  Current complaints: pelvic cramping, seems to be getting worse. Has bowel issues too, so not sure which it is coming from. Has celiac, IBS, fibromyalgia, raynaud's, sjogren's. Had pelvic u/s in 12/15/18 for pelvic pain and dyspareunia- had 1.2cm Rt intramural fundal fibroid- but otherwise was neg. Had CT 07/26/19 for abd & pelvic pain and cramping: had colonic diverticulosis w/o diverticulitis or other acute findings.   Vulvar skin tags- one kind of gets pulled in underwear and irritated Not currently sexually active    Depression screen [facility] 2/9 02/06/2020 12/14/2018  Decreased Interest 2 0  Down, Depressed, Hopeless 2 0  PHQ - 2 Score 4 0  Altered sleeping 2 -  Tired, decreased energy 2 -  Change in appetite 2 -  Feeling bad or failure about yourself  1 -  Trouble concentrating 1 -  Moving slowly or fidgety/restless 0 -  Suicidal thoughts 0 -  PHQ-9 Score 12 -     PCP: Toni Amend w/ Dr. Margo Aye      does not desire labs No LMP recorded. Patient is postmenopausal. The current method of family planning is abstinence and post menopausal status.  Last pap 12/14/18. Results were: neg w/ -HRHPV. H/O abnormal pap: yes H/O cervical laser ablation, 2017: neg w/ -HRHPV, 2018: ASCUS w/ -HRHPV, 2019: neg w/ +HRHPV Last mammogram: 11/23/19. Results were: normal. Family h/o breast cancer: pt's mom had 1/4 of breast removed in her 30s- not sure if it was cancer Last colonoscopy: ~9-54yrs ago. Results were: normal. Family h/o colorectal cancer: no. Had TCS scheduled recently, but had to cancel d/t sickness Review of Systems:   Pertinent items are noted in HPI Denies any headaches, blurred vision, fatigue, shortness of breath, chest pain,  abdominal pain, abnormal vaginal discharge/itching/odor/irritation, problems with periods, bowel movements, urination, or intercourse unless otherwise stated above. Pertinent History Reviewed:  Reviewed past medical,surgical, social and family history.  Reviewed problem list, medications and allergies. Physical Assessment:   Vitals:   02/06/20 1043  BP: 118/84  Pulse: 64  Weight: 165 lb 3.2 oz (74.9 kg)  Height: 5\' 3"  (1.6 m)  Body mass index is 29.26 kg/m.        Physical Examination:   General appearance - well appearing, and in no distress  Mental status - alert, oriented to person, place, and time  Psych:  She has a normal mood and affect  Skin - warm and dry, normal color, no suspicious lesions noted  Chest - effort normal  Heart - normal rate  Neck:  Not examined  Breasts - breasts appear normal, no suspicious masses, no skin or nipple changes or  axillary nodes  Abdomen - soft, nontender, nondistended, no masses or organomegaly  Pelvic - VULVA: normal appearing vulva with no masses, tenderness. Skin tag and condyloma Rt labia majora  VAGINA: normal appearing vagina with normal color and discharge, no lesions  CERVIX: normal appearing cervix without discharge or lesions, no CMT  Thin prep pap is done w/ HR HPV cotesting  UTERUS: uterus is felt to be normal size, shape, consistency and nontender   ADNEXA: No adnexal masses or tenderness noted.  Extremities:  No swelling or varicosities noted  Chaperone:  No results found for this or any previous visit (from the past 24 hour(s)).  Assessment & Plan:  1) Well-Woman Exam breast exam and pap smear  2) Cramping> will get pelvic u/s and see LHE after  3) Skin tag and condyloma> discussed can schedule removal and TCA treatment at later date  Labs/procedures today: pap  Mammogram next Sept, or sooner if problems Colonoscopy- reschedule asap   Orders Placed This Encounter  Procedures  . US PELVIS  (TRANSABDOMINAL ONLY)  . US PELVIS TRANSVAGINAL NON-OB (TV ONLY)    Meds: No orders of the defined types were placed in this encounter.   Follow-up: Return for 1st available, US:GYN and f/u w/ LHE only after.  Cheral Marker CNM, [facility] 02/06/2020 1:43 PM

## 2020-02-06 NOTE — Telephone Encounter (Signed)
Called patient and she is scheduled for appointment

## 2020-02-06 NOTE — Telephone Encounter (Signed)
Patient called and said that she would like to schedule her tcs now.  Had to cancel prior due to being sick

## 2020-02-06 NOTE — Telephone Encounter (Signed)
Noted  

## 2020-02-14 LAB — CYTOLOGY - PAP
Comment: NEGATIVE
Comment: NEGATIVE
Diagnosis: NEGATIVE
HPV 16: NEGATIVE
HPV 18 / 45: NEGATIVE
High risk HPV: POSITIVE — AB

## 2020-02-19 ENCOUNTER — Ambulatory Visit (HOSPITAL_COMMUNITY)
Admission: RE | Admit: 2020-02-19 | Discharge: 2020-02-19 | Disposition: A | Discharge status: Home / Self Care | Payer: 59 | Source: Ambulatory Visit | Attending: Women's Health | Admitting: Women's Health

## 2020-02-19 ENCOUNTER — Ambulatory Visit (HOSPITAL_COMMUNITY): Admission: RE | Admit: 2020-02-19 | Payer: 59 | Source: Ambulatory Visit

## 2020-02-19 ENCOUNTER — Other Ambulatory Visit: Payer: Self-pay

## 2020-02-19 DIAGNOSIS — R102 Pelvic and perineal pain: Secondary | ICD-10-CM | POA: Insufficient documentation

## 2020-02-20 ENCOUNTER — Ambulatory Visit: Payer: 59 | Admitting: Obstetrics & Gynecology

## 2020-02-20 ENCOUNTER — Other Ambulatory Visit: Payer: 59

## 2020-03-27 ENCOUNTER — Ambulatory Visit: Payer: 59 | Admitting: Nurse Practitioner

## 2020-04-15 ENCOUNTER — Ambulatory Visit: Payer: Self-pay

## 2020-04-23 ENCOUNTER — Other Ambulatory Visit: Payer: Self-pay

## 2020-04-23 ENCOUNTER — Ambulatory Visit (INDEPENDENT_AMBULATORY_CARE_PROVIDER_SITE_OTHER): Payer: 59 | Admitting: Women's Health

## 2020-04-23 ENCOUNTER — Encounter: Payer: Self-pay | Admitting: Women's Health

## 2020-04-23 VITALS — BP 132/76 | HR 71 | Wt 171.0 lb

## 2020-04-23 DIAGNOSIS — N644 Mastodynia: Secondary | ICD-10-CM | POA: Diagnosis not present

## 2020-04-23 DIAGNOSIS — N9089 Other specified noninflammatory disorders of vulva and perineum: Secondary | ICD-10-CM

## 2020-04-23 NOTE — Progress Notes (Signed)
   GYN VISIT Patient name: Katie Davis MRN [id number]  Date of birth: 12-13-1955 Chief Complaint:   Breast Pain and Skin Tag  History of Present Illness:   Katie Davis is a 66 y.o. G76P0030 Caucasian female being seen today for report of constant Lt breast pain x .  On Lt lateral side of Lt breast. Sharp shooting at times, especially when laying on that side. Had bilateral breast masses removed 'a long time ago', developed keloids that caused bad pain inside of breasts, had them removed and breasts reduction. Some of pain feels like its in scar tissue. Normal mammogram 11/23/19. Also has vulvar skin tags and condyloma that get irritated by underwear she is wanting removed. Had new spot pop up beside of skin tag on Lt vulva and she squeezed it and it hurt badly.  Depression screen [facility] 2/9 02/06/2020 12/14/2018  Decreased Interest 2 0  Down, Depressed, Hopeless 2 0  PHQ - 2 Score 4 0  Altered sleeping 2 -  Tired, decreased energy 2 -  Change in appetite 2 -  Feeling bad or failure about yourself  1 -  Trouble concentrating 1 -  Moving slowly or fidgety/restless 0 -  Suicidal thoughts 0 -  PHQ-9 Score 12 -    No LMP recorded. Patient is postmenopausal. The current method of family planning is abstinence and post menopausal status.  Last pap 02/06/20. Results were: NILM w/ HRHPV positive Review of Systems:   Pertinent items are noted in HPI Denies fever/chills, dizziness, headaches, visual disturbances, fatigue, shortness of breath, chest pain, abdominal pain, vomiting, abnormal vaginal discharge/itching/odor/irritation, problems with periods, bowel movements, urination, or intercourse unless otherwise stated above.  Pertinent History Reviewed:  Reviewed past medical,surgical, social, obstetrical and family history.  Reviewed problem list, medications and allergies. Physical Assessment:   Vitals:   04/23/20 1159  BP: 132/76  Pulse: 71  Weight: 171 lb (77.6 kg)  Body mass index  is 30.29 kg/m.       Physical Examination:   General appearance: alert, well appearing, and in no distress  Mental status: alert, oriented to person, place, and time  Skin: warm & dry   Cardiovascular: normal heart rate noted  Respiratory: normal respiratory effort, no distress  Breasts: Breasts - breasts appear normal, no suspicious masses, no skin or nipple changes or axillary nodes: +tenderness Lt lateral breast in and above scar  Abdomen: soft, non-tender   Pelvic: vulva- 1 skin tag Rt labia, broad condyloma Rt labia, Lt labia: 1 skin tag and ~1cm raised lesion adjacent to skin tag- this was not present on last exam. MD not in office right now. Discussed rescheduling vulvar exam/skin tag removal w/ MD   Extremities: no edema   Chaperone: Katie Davis    No results found for this or any previous visit (from the past 24 hour(s)).  Assessment & Plan:  1) Lt breast pain> ordered diagnostic mammo & u/s, routed to [facility] to schedule and notify pt.   2) Vulvar skin tags, condyloma, and new lesion> recommended visit w/ MD for eval of new lesion and removal of skin tags   Meds: No orders of the defined types were placed in this encounter.   Orders Placed This Encounter  Procedures  . US BREAST LTD UNI LEFT INC AXILLA  . MM Digital Diagnostic Unilat L    Return for 1st available w/ [facility] for vulvar exam/skin tag removal.  Cheral Marker CNM, WHNP-BC 04/23/2020 1:39 PM

## 2020-04-30 ENCOUNTER — Ambulatory Visit (HOSPITAL_COMMUNITY)
Admission: RE | Admit: 2020-04-30 | Discharge: 2020-04-30 | Disposition: A | Discharge status: Home / Self Care | Payer: 59 | Source: Ambulatory Visit | Attending: Women's Health | Admitting: Women's Health

## 2020-04-30 ENCOUNTER — Other Ambulatory Visit: Payer: Self-pay

## 2020-04-30 DIAGNOSIS — N644 Mastodynia: Secondary | ICD-10-CM

## 2020-05-02 ENCOUNTER — Ambulatory Visit: Payer: 59 | Admitting: Obstetrics & Gynecology

## 2020-05-07 ENCOUNTER — Ambulatory Visit (INDEPENDENT_AMBULATORY_CARE_PROVIDER_SITE_OTHER): Payer: 59 | Admitting: Nurse Practitioner

## 2020-05-07 ENCOUNTER — Encounter: Payer: Self-pay | Admitting: Internal Medicine

## 2020-05-07 ENCOUNTER — Encounter: Payer: Self-pay | Admitting: Nurse Practitioner

## 2020-05-07 ENCOUNTER — Other Ambulatory Visit: Payer: Self-pay

## 2020-05-07 ENCOUNTER — Encounter: Payer: Self-pay | Admitting: *Deleted

## 2020-05-07 VITALS — BP 127/76 | HR 69 | Temp 96.9°F | Ht 63.0 in | Wt 173.0 lb

## 2020-05-07 DIAGNOSIS — R1084 Generalized abdominal pain: Secondary | ICD-10-CM

## 2020-05-07 DIAGNOSIS — Z0000 Encounter for general adult medical examination without abnormal findings: Secondary | ICD-10-CM | POA: Diagnosis not present

## 2020-05-07 DIAGNOSIS — K5900 Constipation, unspecified: Secondary | ICD-10-CM

## 2020-05-07 DIAGNOSIS — R110 Nausea: Secondary | ICD-10-CM | POA: Diagnosis not present

## 2020-05-07 DIAGNOSIS — R198 Other specified symptoms and signs involving the digestive system and abdomen: Secondary | ICD-10-CM | POA: Insufficient documentation

## 2020-05-07 DIAGNOSIS — Z0001 Encounter for general adult medical examination with abnormal findings: Secondary | ICD-10-CM

## 2020-05-07 HISTORY — DX: Encounter for general adult medical examination with abnormal findings: Z00.01

## 2020-05-07 NOTE — Patient Instructions (Addendum)
Your health issues we discussed today were:   Constipation: 1. Start taking Linzess 72 mcg once a day.  Take this first thing in the morning on an empty stomach 2. We are giving you 2 additional boxes of samples 3. If you develop severe diarrhea that is intolerable or if it lasts longer than 5 days, stop the medication and notify us 4. Regardless, call us in 1 to 2 weeks and let us know how you are doing on Linzess 5. Further recommendations will depend on how you do on Linzess  Need for a colonoscopy: 1. We will schedule your colonoscopy for you 2. Further recommendations will follow your colonoscopy  Nausea: 1. Continue your current nausea medications as you seem to be at baseline 2. Call us for any worsening or severe symptoms  Overall I recommend:  1. Continue your other current medications 2. Return for follow-up in 3 months 3. Call us for any questions or concerns   ---------------------------------------------------------------  I am glad you have gotten your COVID-19 vaccination!  Even though you are fully vaccinated you should continue to follow CDC and state/local guidelines.  ---------------------------------------------------------------   At [facility] we value your feedback. You may receive a survey about your visit today. Please share your experience as we strive to create trusting relationships with our patients to provide genuine, compassionate, quality care.  We appreciate your understanding and patience as we review any laboratory studies, imaging, and other diagnostic tests that are ordered as we care for you. Our office policy is 5 business days for review of these results, and any emergent or urgent results are addressed in a timely manner for your best interest. If you do not hear from our office in 1 week, please contact us.   We also encourage the use of MyChart, which contains your medical information for your review as well. If you are not  enrolled in this feature, an access code is on this after visit summary for your convenience. Thank you for allowing [place] to be involved in your care.  It was great to see you today!  I hope you have a great end of winter and early spring!!

## 2020-05-07 NOTE — Progress Notes (Signed)
Referring Provider: Celene Squibb, MD Primary Care Physician:  Celene Squibb, MD Primary GI:  Dr. Abbey Chatters  Chief Complaint  Patient presents with  . Constipation    PCP suggested Linzess    HPI:   Katie Davis is a 65 y.o. female who presents to schedule colonoscopy.  The patient was last seen in our office 10/24/2019 for generalized abdominal pain and to schedule colonoscopy.  Noted normal colonoscopy (small bowel) 10 years ago in [place] with records requested.  History of IBS and diverticulosis, celiac disease.  Variable constipation alternating with diarrhea.she uses MiraLAX daily because of potential.  History of hemorrhoids with her constipation and "once in a blue moon" tissue hematochezia.  Noted blockage" in artery in my stomach diagnosed in 2015 but only 50% so they are not going to do anything about it, though it does sometimes make me nauseated."  States she has "severe celiac" and with contamination of food she will end up in the hospital because of retching and pulled muscles.  Also noted fibromyalgia.  No other overt GI complaints.  Recommended continue current regimen, notify us of any worsening, request previous colonoscopy from [place] and schedule repeat colonoscopy, follow-up in 3 months.  She was previously scheduled for an updated colonoscopy which she called our office 11/07/2019 to cancel because "there is a lot going on right now."  Not wanting to reschedule at that time.  However, she did call back 02/06/2020 and indicated she would like to schedule but she was informed she would need another office visit which was scheduled for today.  Today she states she is doing okay overall. She has been having constipation. Currently on 2-3 OTC medications including MiraLAX, dulcolax, natural remedies. Her PCP mentioned Linzess and she would like to discuss this. Has been using MiraLAX for years, but not working as well. Her PCP gave her samples of Linzess 72 mcg. She  develops a lot of bloating and gas, then will go urgently, and then repeat the process. Rare tissue hematochezia with known hemorrhoids, especially if she has a hard stool. Avoids straining. No other recent changes. She is having some autoimmue flares (Sjograns, fibromyalgia, etc) due to increased stress. Intermittent nausea related to "50% blockage in a blood vessel to my stomach" and constipation. Denies ongoing abdominal pain, fever, chills, unintentional weight loss. Denies URI or flu-like symptoms. Denies loss of sense of taste or smell. The patient has received COVID-19 vaccination(s). Denies chest pain, dyspnea, dizziness, lightheadedness, syncope, near syncope. Denies any other upper or lower GI symptoms.  Past Medical History:  Diagnosis Date  . Abnormal glandular Papanicolaou smear of cervix   . Anxiety   . Body mass index 30.0-30.9, adult   . Celiac disease   . Chronic vascular disorder of intestine ([place])   . Fibromyalgia   . Graves disease   . Headache    Migrains  . Hyperlipidemia   . Hypertension   . Hypothyroidism   . IBS (irritable bowel syndrome) With constipation   . Lupus (systemic lupus erythematosus) ([place])   . Obesity, unspecified   . Osteoporosis   . Raynaud disease   . Sjogren's disease [facility])     Past Surgical History:  Procedure Laterality Date  . APPENDECTOMY    . CHOLECYSTECTOMY    . OTHER SURGICAL HISTORY     biopsies on both feet  . REDUCTION MAMMAPLASTY      Current Outpatient Medications  Medication Sig Dispense Refill  . acidophilus (RISAQUAD) CAPS  capsule Take 1 capsule by mouth daily.    Marland Kitchen albuterol (VENTOLIN HFA) 108 (90 Base) MCG/ACT inhaler Inhale 1-2 puffs into the lungs every 6 (six) hours as needed for wheezing or shortness of breath. 18 g 0  . Ascorbic Acid (VITAMIN C) 500 MG CHEW Chew by mouth daily.     Marland Kitchen atenolol (TENORMIN) 50 MG tablet Take 50 mg by mouth 2 (two) times daily.     Marland Kitchen B-Complex TABS Take by mouth daily.     . calcium  carbonate (TUMS - DOSED IN MG ELEMENTAL CALCIUM) 500 MG chewable tablet Chew 1 tablet by mouth daily.    . Cholecalciferol (VITAMIN D3 GUMMIES) 25 MCG (1000 UT) CHEW Chew 6,000 Units by mouth daily.    . clonazePAM (KLONOPIN) 0.5 MG tablet Take 0.25-0.5 mg by mouth 2 (two) times daily as needed for anxiety.     . cyclobenzaprine (FLEXERIL) 10 MG tablet Take 10 mg by mouth 3 (three) times daily as needed for muscle spasms.     Marland Kitchen ELDERBERRY PO Take 1 capsule by mouth daily.    . fluorometholone (FML) 0.1 % ophthalmic suspension Place 1 drop into both eyes in the morning and at bedtime.    . fluticasone (FLOVENT HFA) 110 MCG/ACT inhaler Inhale 1 puff into the lungs as needed.    Marland Kitchen ibuprofen (ADVIL,MOTRIN) 800 MG tablet Take 800 mg by mouth every 8 (eight) hours as needed for moderate pain.     . Magnesium Hydroxide (DULCOLAX PO) Take by mouth as needed.    . meclizine (ANTIVERT) 12.5 MG tablet Take 12.5-25 mg by mouth every 6 (six) hours as needed for dizziness.     . Omega-3 1000 MG CAPS Take 1,000 mg by mouth 2 (two) times daily with a meal.     . ondansetron (ZOFRAN) 4 MG tablet Take 1 tablet (4 mg total) by mouth every 8 (eight) hours as needed for nausea or vomiting. 12 tablet 0  . oxyCODONE-acetaminophen (PERCOCET/ROXICET) 5-325 MG tablet Take 1 tablet by mouth 2 (two) times daily as needed for moderate pain or severe pain.     Vladimir Faster Glycol-Propyl Glycol (SYSTANE OP) Place 1 drop into both eyes daily.    . polyethylene glycol (MIRALAX / GLYCOLAX) 17 g packet Take 17 g by mouth daily.    . RESTASIS 0.05 % ophthalmic emulsion Place 1 drop into both eyes 2 (two) times daily.    . rosuvastatin (CRESTOR) 5 MG tablet Take 5 mg by mouth 3 (three) times a week.    Marland Kitchen SYNTHROID 112 MCG tablet Take 1 tablet by mouth daily.     No current facility-administered medications for this visit.    Allergies as of 05/07/2020 - Review Complete 05/07/2020  Allergen Reaction Noted  . Bactrim  [sulfamethoxazole-trimethoprim] Nausea And Vomiting 11/16/2018  . Doxycycline  05/07/2020  . Gluten meal  11/16/2018  . Keflex [cephalexin]  12/09/2018  . Latex Hives 11/16/2018  . Levaquin [levofloxacin] Nausea And Vomiting 11/16/2018  . Sulfa antibiotics Nausea And Vomiting 11/16/2018  . Contrast media [iodinated diagnostic agents] Nausea And Vomiting and Rash 11/16/2018    Family History  Problem Relation Age of Onset  . Congestive Heart Failure Mother   . Aneurysm Father   . Celiac disease Brother   . Melanoma Brother   . Aneurysm Brother        stomach  . Stroke Sister   . Celiac disease Sister   . Irritable bowel syndrome Sister   .  Celiac disease Brother   . Aneurysm Sister   . Other Sister        multiple back surgeries  . Colon cancer Neg Hx     Social History   Socioeconomic History  . Marital status: Married    Spouse name: Not on file  . Number of children: Not on file  . Years of education: Not on file  . Highest education level: Not on file  Occupational History  . Not on file  Tobacco Use  . Smoking status: Never Smoker  . Smokeless tobacco: Never Used  Vaping Use  . Vaping Use: Never used  Substance and Sexual Activity  . Alcohol use: Yes    Comment: wine occ  . Drug use: Never  . Sexual activity: Not Currently    Birth control/protection: Post-menopausal  Other Topics Concern  . Not on file  Social History Narrative  . Not on file   Social Determinants of Health   Financial Resource Strain: Low Risk   . Difficulty of Paying Living Expenses: Not hard at all  Food Insecurity: No Food Insecurity  . Worried About Charity fundraiser in the Last Year: Never true  . Ran Out of Food in the Last Year: Never true  Transportation Needs: No Transportation Needs  . Lack of Transportation (Medical): No  . Lack of Transportation (Non-Medical): No  Physical Activity: Insufficiently Active  . Days of Exercise per Week: 2 days  . Minutes of Exercise  per Session: 10 min  Stress: Stress Concern Present  . Feeling of Stress : To some extent  Social Connections: Moderately Integrated  . Frequency of Communication with Friends and Family: Three times a week  . Frequency of Social Gatherings with Friends and Family: Once a week  . Attends Religious Services: More than 4 times per year  . Active Member of Clubs or Organizations: No  . Attends Archivist Meetings: Never  . Marital Status: Married    Subjective: Review of Systems  Constitutional: Negative for chills, fever, malaise/fatigue and weight loss.  HENT: Negative for congestion and sore throat.   Respiratory: Negative for cough and shortness of breath.   Cardiovascular: Negative for chest pain and palpitations.  Gastrointestinal: Positive for abdominal pain, blood in stool, constipation and nausea. Negative for diarrhea, heartburn, melena and vomiting.  Musculoskeletal: Negative for joint pain and myalgias.  Skin: Negative for rash.  Neurological: Negative for dizziness and weakness.  Endo/Heme/Allergies: Does not bruise/bleed easily.  Psychiatric/Behavioral: Negative for depression. The patient is not nervous/anxious.   All other systems reviewed and are negative.    Objective: BP 127/76   Pulse 69   Temp (!) 96.9 F (36.1 C) (Temporal)   Ht _0  (1.6 m)   Wt 173 lb (78.5 kg)   BMI 30.65 kg/m  Physical Exam Vitals and nursing note reviewed.  Constitutional:      General: She is not in acute distress.    Appearance: Normal appearance. She is well-developed. She is obese. She is not ill-appearing, toxic-appearing or diaphoretic.  HENT:     Head: Normocephalic and atraumatic.     Nose: No congestion or rhinorrhea.  Eyes:     General: No scleral icterus. Cardiovascular:     Rate and Rhythm: Normal rate and regular rhythm.     Heart sounds: Normal heart sounds.  Pulmonary:     Effort: Pulmonary effort is normal. No respiratory distress.     Breath  sounds: Normal breath sounds.  Abdominal:     General: Bowel sounds are normal.     Palpations: Abdomen is soft. There is no hepatomegaly, splenomegaly or mass.     Tenderness: There is no abdominal tenderness. There is no guarding or rebound.     Hernia: No hernia is present.  Skin:    General: Skin is warm and dry.     Coloration: Skin is not jaundiced.     Findings: No rash.  Neurological:     General: No focal deficit present.     Mental Status: She is alert and oriented to person, place, and time.  Psychiatric:        Attention and Perception: Attention normal.        Mood and Affect: Mood normal.        Speech: Speech normal.        Behavior: Behavior normal.        Thought Content: Thought content normal.        Cognition and Memory: Cognition and memory normal.      Assessment:  Pleasant 65 year old female presents for rescheduling of colonoscopy.  At this time she also discussed constipation.  She has chronic underlying nausea related to past medical history as documented in HPI and in her previous office visit.  Generally she is clinically doing okay.  No red flag/warning signs or symptoms.  Chronic nausea: At baseline.  She feels her nausea is due to "50% blockage in a blood vessel to my stomach" that she was told is not severe enough to do anything about.  Also, more likely at this time, due to ongoing constipation on multiple over-the-counter medications.  Recommend she continue her current nausea regimen  Constipation: Some progressive worsening of her constipation, although she has had some form of constipation for years.  Previously on MiraLAX which is no longer very effective.  Her primary care mentioned Linzess and she would like to discuss this today.  After discussion we decided to trial daily daily.  I will provide her with a couple boxes of samples.  Side effect precautions were discussed.  Requested progress for 1 to 2 weeks.  Further recommendations pending her  clinical progress.  Need for colonoscopy: She is due for colonoscopy as discussed at her last office visit.  She was previously scheduled but had to cancel because she was ill and they thought she might about Covid.  Fortunately, she did not have Covid.  However now she is able to reschedule and would like to proceed at this time.  Proceed with TCS on propofol/MAC with Dr. Abbey Chatters on propofol/MAC in near future: the risks, benefits, and alternatives have been discussed with the patient in detail. The patient states understanding and desires to proceed.  ASA II   Plan: 1. Linzess 72 mcg daily, samples provided 2. Progress report in 1 to 2 weeks 3. Call for any significant symptoms on Linzess 4. Stop taking Dulcolax and MiraLAX when starting Linzess 5. Colonoscopy as described above 6. Follow-up in 3 months    Thank you for allowing [place] to participate in the care of Rayburn Ma, DNP, AGNP-C Adult & Gerontological Nurse Practitioner [facility]   05/07/2020 3:04 PM   Disclaimer: This note was dictated with voice recognition software. Similar sounding words can inadvertently be transcribed and may not be corrected upon review.

## 2020-05-21 ENCOUNTER — Other Ambulatory Visit (HOSPITAL_COMMUNITY): Payer: 59

## 2020-05-21 ENCOUNTER — Encounter (HOSPITAL_COMMUNITY): Payer: 59

## 2020-05-21 ENCOUNTER — Telehealth: Payer: Self-pay | Admitting: Internal Medicine

## 2020-05-21 NOTE — Telephone Encounter (Signed)
Pt called to say that her doctor was going to fax over labs on patient. I told her that I received it this morning and it's in provider's box Wynne Dust, NP). She would like for nurse to return her call. [phone]

## 2020-05-22 ENCOUNTER — Telehealth: Payer: Self-pay | Admitting: Internal Medicine

## 2020-05-22 NOTE — Telephone Encounter (Signed)
Patient returning call. [phone]

## 2020-05-22 NOTE — Telephone Encounter (Signed)
Returned the pt's call and LM for her to return my call.

## 2020-05-23 NOTE — Telephone Encounter (Signed)
See other phone note

## 2020-05-24 ENCOUNTER — Telehealth: Payer: Self-pay | Admitting: Internal Medicine

## 2020-05-24 DIAGNOSIS — K5900 Constipation, unspecified: Secondary | ICD-10-CM

## 2020-05-24 MED ORDER — LINACLOTIDE 72 MCG PO CAPS: 72.0000 ug | ORAL_CAPSULE | Freq: Every day | ORAL | 5 refills | Status: DC

## 2020-05-24 NOTE — Telephone Encounter (Signed)
Patient called and said the linzess 72 samples worked and she would like a prescription sent to [facility] in [place]

## 2020-05-24 NOTE — Telephone Encounter (Signed)
Handled by Wynne Dust

## 2020-05-24 NOTE — Addendum Note (Signed)
Addended by: Delane Ginger, Esty Ahuja A on: 05/24/2020 03:46 PM   Modules accepted: Orders

## 2020-05-24 NOTE — Telephone Encounter (Signed)
Pt phoned to say Linzess 72 samples worked and she would like a Rx sent to [facility] in [place]

## 2020-05-24 NOTE — Telephone Encounter (Signed)
Rx sent per request. 

## 2020-05-27 ENCOUNTER — Telehealth: Payer: Self-pay

## 2020-05-27 NOTE — Telephone Encounter (Signed)
Phoned and LM on pt's vm regarding Rx for Linzess 72 mcg being sent to [facility]

## 2020-05-27 NOTE — Telephone Encounter (Signed)
Hi Katie Davis I know you are off today, but this pt has phoned several times wanting to speak with you regarding her thyroid labs. Please call this pt with her results from her thyroid Dr. Bonita Quin responded to her Rx request but you didn't respond to her labs.

## 2020-05-29 NOTE — Telephone Encounter (Signed)
Noted.   Spoke with the pt and she found out what she needed the other day.

## 2020-05-29 NOTE — Telephone Encounter (Signed)
I looked back in her chart and we do not have any thyroid labs in the [facility] system since 2020. I'm not sure exactly what labs she's talking about? We did do a celiac panel (which was placed in the records requested box) which were normal indicating no celiac disease (which can cause diarrhea). If she's looking for thyroid results she'll need to contact the endocrinologist and/or PCP who ordered the test.

## 2020-06-07 ENCOUNTER — Other Ambulatory Visit: Payer: Self-pay

## 2020-06-07 ENCOUNTER — Other Ambulatory Visit (HOSPITAL_COMMUNITY)
Admission: RE | Admit: 2020-06-07 | Discharge: 2020-06-07 | Disposition: A | Discharge status: Home / Self Care | Payer: 59 | Source: Ambulatory Visit | Attending: Internal Medicine | Admitting: Internal Medicine

## 2020-06-07 DIAGNOSIS — Z01812 Encounter for preprocedural laboratory examination: Secondary | ICD-10-CM | POA: Diagnosis present

## 2020-06-07 DIAGNOSIS — Z20822 Contact with and (suspected) exposure to covid-19: Secondary | ICD-10-CM | POA: Diagnosis not present

## 2020-06-07 LAB — SARS CORONAVIRUS 2 (TAT 6-24 HRS): SARS Coronavirus 2: NEGATIVE

## 2020-06-10 ENCOUNTER — Other Ambulatory Visit: Payer: Self-pay

## 2020-06-10 ENCOUNTER — Ambulatory Visit (HOSPITAL_COMMUNITY)
Admission: RE | Admit: 2020-06-10 | Discharge: 2020-06-10 | Disposition: A | Discharge status: Home / Self Care | Payer: 59 | Attending: Internal Medicine | Admitting: Internal Medicine

## 2020-06-10 ENCOUNTER — Encounter (HOSPITAL_COMMUNITY)
Admission: RE | Disposition: A | Discharge status: Home / Self Care | Payer: Self-pay | Source: Home / Self Care | Attending: Internal Medicine

## 2020-06-10 ENCOUNTER — Ambulatory Visit (HOSPITAL_COMMUNITY): Payer: 59 | Admitting: Anesthesiology

## 2020-06-10 ENCOUNTER — Encounter (HOSPITAL_COMMUNITY): Payer: Self-pay

## 2020-06-10 DIAGNOSIS — M797 Fibromyalgia: Secondary | ICD-10-CM | POA: Diagnosis not present

## 2020-06-10 DIAGNOSIS — Z91041 Radiographic dye allergy status: Secondary | ICD-10-CM | POA: Insufficient documentation

## 2020-06-10 DIAGNOSIS — Z882 Allergy status to sulfonamides status: Secondary | ICD-10-CM | POA: Diagnosis not present

## 2020-06-10 DIAGNOSIS — Z79899 Other long term (current) drug therapy: Secondary | ICD-10-CM | POA: Insufficient documentation

## 2020-06-10 DIAGNOSIS — M329 Systemic lupus erythematosus, unspecified: Secondary | ICD-10-CM | POA: Diagnosis not present

## 2020-06-10 DIAGNOSIS — Z888 Allergy status to other drugs, medicaments and biological substances status: Secondary | ICD-10-CM | POA: Insufficient documentation

## 2020-06-10 DIAGNOSIS — K5730 Diverticulosis of large intestine without perforation or abscess without bleeding: Secondary | ICD-10-CM | POA: Diagnosis not present

## 2020-06-10 DIAGNOSIS — Z823 Family history of stroke: Secondary | ICD-10-CM | POA: Insufficient documentation

## 2020-06-10 DIAGNOSIS — Z8249 Family history of ischemic heart disease and other diseases of the circulatory system: Secondary | ICD-10-CM | POA: Diagnosis not present

## 2020-06-10 DIAGNOSIS — K581 Irritable bowel syndrome with constipation: Secondary | ICD-10-CM | POA: Diagnosis not present

## 2020-06-10 DIAGNOSIS — I7300 Raynaud's syndrome without gangrene: Secondary | ICD-10-CM | POA: Diagnosis not present

## 2020-06-10 DIAGNOSIS — E0500 Thyrotoxicosis with diffuse goiter without thyrotoxic crisis or storm: Secondary | ICD-10-CM | POA: Insufficient documentation

## 2020-06-10 DIAGNOSIS — Z7951 Long term (current) use of inhaled steroids: Secondary | ICD-10-CM | POA: Insufficient documentation

## 2020-06-10 DIAGNOSIS — Z881 Allergy status to other antibiotic agents status: Secondary | ICD-10-CM | POA: Diagnosis not present

## 2020-06-10 DIAGNOSIS — Z1211 Encounter for screening for malignant neoplasm of colon: Secondary | ICD-10-CM | POA: Diagnosis present

## 2020-06-10 DIAGNOSIS — R1030 Lower abdominal pain, unspecified: Secondary | ICD-10-CM | POA: Insufficient documentation

## 2020-06-10 DIAGNOSIS — Z91040 Latex allergy status: Secondary | ICD-10-CM | POA: Diagnosis not present

## 2020-06-10 DIAGNOSIS — Z8379 Family history of other diseases of the digestive system: Secondary | ICD-10-CM | POA: Diagnosis not present

## 2020-06-10 DIAGNOSIS — K648 Other hemorrhoids: Secondary | ICD-10-CM | POA: Insufficient documentation

## 2020-06-10 DIAGNOSIS — K5900 Constipation, unspecified: Secondary | ICD-10-CM

## 2020-06-10 DIAGNOSIS — K9041 Non-celiac gluten sensitivity: Secondary | ICD-10-CM | POA: Diagnosis not present

## 2020-06-10 DIAGNOSIS — Z79890 Hormone replacement therapy (postmenopausal): Secondary | ICD-10-CM | POA: Insufficient documentation

## 2020-06-10 DIAGNOSIS — M3500 Sicca syndrome, unspecified: Secondary | ICD-10-CM | POA: Insufficient documentation

## 2020-06-10 DIAGNOSIS — I10 Essential (primary) hypertension: Secondary | ICD-10-CM | POA: Insufficient documentation

## 2020-06-10 HISTORY — PX: COLONOSCOPY WITH PROPOFOL: SHX5780

## 2020-06-10 HISTORY — DX: Unspecified asthma, uncomplicated: J45.909

## 2020-06-10 SURGERY — COLONOSCOPY WITH PROPOFOL
Anesthesia: General

## 2020-06-10 MED ORDER — LACTATED RINGERS IV SOLN: INTRAVENOUS | Status: DC

## 2020-06-10 MED ORDER — STERILE WATER FOR IRRIGATION IR SOLN
Status: DC | PRN
  Administered 2020-06-10: 1.5 mL

## 2020-06-10 MED ORDER — PROPOFOL 10 MG/ML IV BOLUS
INTRAVENOUS | Status: DC | PRN
  Administered 2020-06-10: 100 mg via INTRAVENOUS
  Administered 2020-06-10 (×2): 50 mg via INTRAVENOUS

## 2020-06-10 MED ORDER — LIDOCAINE HCL (CARDIAC) PF 100 MG/5ML IV SOSY
PREFILLED_SYRINGE | INTRAVENOUS | Status: DC | PRN
  Administered 2020-06-10: 50 mg via INTRAVENOUS

## 2020-06-10 MED ORDER — LINACLOTIDE 145 MCG PO CAPS: 145.0000 ug | ORAL_CAPSULE | Freq: Every day | ORAL | 5 refills | Status: DC

## 2020-06-10 NOTE — Anesthesia Postprocedure Evaluation (Signed)
Anesthesia Post Note  Patient: Katie Davis  Procedure(s) Performed: COLONOSCOPY WITH PROPOFOL (N/A )  Patient location during evaluation: Endoscopy Anesthesia Type: General Level of consciousness: awake and alert and oriented Pain management: pain level controlled Vital Signs Assessment: post-procedure vital signs reviewed and stable Respiratory status: spontaneous breathing, nonlabored ventilation and respiratory function stable Cardiovascular status: blood pressure returned to baseline and stable Postop Assessment: no apparent nausea or vomiting Anesthetic complications: no   No complications documented.   Last Vitals:  Vitals:   06/10/20 0852 06/10/20 1006  BP: 130/69 (!) 91/53  Pulse: 73 60  Resp: 19 16  Temp: 36.9 C 36.6 C  SpO2: 100% 96%    Last Pain:  Vitals:   06/10/20 1006  TempSrc: Oral  PainSc: 0-No pain                 Julian Reil

## 2020-06-10 NOTE — Discharge Instructions (Addendum)
Colonoscopy Discharge Instructions  Read the instructions outlined below and refer to this sheet in the next few weeks. These discharge instructions provide you with general information on caring for yourself after you leave the hospital. Your doctor may also give you specific instructions. While your treatment has been planned according to the most current medical practices available, unavoidable complications occasionally occur.   ACTIVITY  You may resume your regular activity, but move at a slower pace for the next 24 hours.   Take frequent rest periods for the next 24 hours.   Walking will help get rid of the air and reduce the bloated feeling in your belly (abdomen).   No driving for 24 hours (because of the medicine (anesthesia) used during the test).    Do not sign any important legal documents or operate any machinery for 24 hours (because of the anesthesia used during the test).  NUTRITION  Drink plenty of fluids.   You may resume your normal diet as instructed by your doctor.   Begin with a light meal and progress to your normal diet. Heavy or fried foods are harder to digest and may make you feel sick to your stomach (nauseated).   Avoid alcoholic beverages for 24 hours or as instructed.  MEDICATIONS  You may resume your normal medications unless your doctor tells you otherwise.  WHAT YOU CAN EXPECT TODAY  Some feelings of bloating in the abdomen.   Passage of more gas than usual.   Spotting of blood in your stool or on the toilet paper.  IF YOU HAD POLYPS REMOVED DURING THE COLONOSCOPY:  No aspirin products for 7 days or as instructed.   No alcohol for 7 days or as instructed.   Eat a soft diet for the next 24 hours.  FINDING OUT THE RESULTS OF YOUR TEST Not all test results are available during your visit. If your test results are not back during the visit, make an appointment with your caregiver to find out the results. Do not assume everything is normal if  you have not heard from your caregiver or the medical facility. It is important for you to follow up on all of your test results.  SEEK IMMEDIATE MEDICAL ATTENTION IF:  You have more than a spotting of blood in your stool.   Your belly is swollen (abdominal distention).   You are nauseated or vomiting.   You have a temperature over 101.   You have abdominal pain or discomfort that is severe or gets worse throughout the day.   Your colonoscopy was relatively unremarkable.  I did not find any polyps or evidence of colon cancer. You do have diverticulosis and internal hemorrhoids. I would recommend increasing fiber in your diet or adding OTC Benefiber/Metamucil. Be sure to drink at least 4 to 6 glasses of water daily.  I am going to increase your Linzess to 145 mcg daily.  If this is not adequate you can take a daily dose of Dulcolax on top of it.  Follow-up with GI in 3 months. recommend repeat colonoscopy in 10 years.   I hope you have a great rest of your week!  Hennie Duos. Marletta Lor, D.O. Gastroenterology and Hepatology [facility]   Diverticulosis  Diverticulosis is a condition that develops when small pouches (diverticula) form in the wall of the large intestine (colon). The colon is where water is absorbed and stool (feces) is formed. The pouches form when the inside layer of the colon pushes through weak  spots in the outer layers of the colon. You may have a few pouches or many of them. The pouches usually do not cause problems unless they become inflamed or infected. When this happens, the condition is called diverticulitis. What are the causes? The cause of this condition is not known. What increases the risk? The following factors may make you more likely to develop this condition:  Being older than age 64. Your risk for this condition increases with age. Diverticulosis is rare among people younger than age 64. By age 64, many people have it.  Eating a  low-fiber diet.  Having frequent constipation.  Being overweight.  Not getting enough exercise.  Smoking.  Taking over-the-counter pain medicines, like aspirin and ibuprofen.  Having a family history of diverticulosis. What are the signs or symptoms? In most people, there are no symptoms of this condition. If you do have symptoms, they may include:  Bloating.  Cramps in the abdomen.  Constipation or diarrhea.  Pain in the lower left side of the abdomen. How is this diagnosed? Because diverticulosis usually has no symptoms, it is most often diagnosed during an exam for other colon problems. The condition may be diagnosed by:  Using a flexible scope to examine the colon (colonoscopy).  Taking an X-ray of the colon after dye has been put into the colon (barium enema).  Having a CT scan. How is this treated? You may not need treatment for this condition. Your health care provider may recommend treatment to prevent problems. You may need treatment if you have symptoms or if you previously had diverticulitis. Treatment may include:  Eating a high-fiber diet.  Taking a fiber supplement.  Taking a live bacteria supplement (probiotic).  Taking medicine to relax your colon.   Follow these instructions at home: Medicines  Take over-the-counter and prescription medicines only as told by your health care provider.  If told by your health care provider, take a fiber supplement or probiotic. Constipation prevention Your condition may cause constipation. To prevent or treat constipation, you may need to:  Drink enough fluid to keep your urine pale yellow.  Take over-the-counter or prescription medicines.  Eat foods that are high in fiber, such as beans, whole grains, and fresh fruits and vegetables.  Limit foods that are high in fat and processed sugars, such as fried or sweet foods.   General instructions  Try not to strain when you have a bowel movement.  Keep all  follow-up visits as told by your health care provider. This is important. Contact a health care provider if you:  Have pain in your abdomen.  Have bloating.  Have cramps.  Have not had a bowel movement in 3 days. Get help right away if:  Your pain gets worse.  Your bloating becomes very bad.  You have a fever or chills, and your symptoms suddenly get worse.  You vomit.  You have bowel movements that are bloody or black.  You have bleeding from your rectum. Summary  Diverticulosis is a condition that develops when small pouches (diverticula) form in the wall of the large intestine (colon).  You may have a few pouches or many of them.  This condition is most often diagnosed during an exam for other colon problems.  Treatment may include increasing the fiber in your diet, taking supplements, or taking medicines. This information is not intended to replace advice given to you by your health care provider. Make sure you discuss any questions you have with your  health care provider. Document Revised: 09/29/2018 Document Reviewed: 09/29/2018 Elsevier Patient Education  2021 [employer] Inc.  Hemorrhoids Hemorrhoids are swollen veins that may develop:  In the butt (rectum). These are called internal hemorrhoids.  Around the opening of the butt (anus). These are called external hemorrhoids. Hemorrhoids can cause pain, itching, or bleeding. Most of the time, they do not cause serious problems. They usually get better with diet changes, lifestyle changes, and other home treatments. What are the causes? This condition may be caused by:  Having trouble pooping (constipation).  Pushing hard (straining) to poop.  Watery poop (diarrhea).  Pregnancy.  Being very overweight (obese).  Sitting for long periods of time.  Heavy lifting or other activity that causes you to strain.  Anal sex.  Riding a bike for a long period of time. What are the signs or symptoms? Symptoms of  this condition include:  Pain.  Itching or soreness in the butt.  Bleeding from the butt.  Leaking poop.  Swelling in the area.  One or more lumps around the opening of your butt. How is this diagnosed? A doctor can often diagnose this condition by looking at the affected area. The doctor may also:  Do an exam that involves feeling the area with a gloved hand (digital rectal exam).  Examine the area inside your butt using a small tube (anoscope).  Order blood tests. This may be done if you have lost a lot of blood.  Have you get a test that involves looking inside the colon using a flexible tube with a camera on the end (sigmoidoscopy or colonoscopy). How is this treated? This condition can usually be treated at home. Your doctor may tell you to change what you eat, make lifestyle changes, or try home treatments. If these do not help, procedures can be done to remove the hemorrhoids or make them smaller. These may involve:  Placing rubber bands at the base of the hemorrhoids to cut off their blood supply.  Injecting medicine into the hemorrhoids to shrink them.  Shining a type of light energy onto the hemorrhoids to cause them to fall off.  Doing surgery to remove the hemorrhoids or cut off their blood supply. Follow these instructions at home: Eating and drinking  Eat foods that have a lot of fiber in them. These include whole grains, beans, nuts, fruits, and vegetables.  Ask your doctor about taking products that have added fiber (fibersupplements).  Reduce the amount of fat in your diet. You can do this by: ? Eating low-fat dairy products. ? Eating less red meat. ? Avoiding processed foods.  Drink enough fluid to keep your pee (urine) pale yellow.   Managing pain and swelling  Take a warm-water bath (sitz bath) for 20 minutes to ease pain. Do this 3-4 times a day. You may do this in a bathtub or using a portable sitz bath that fits over the toilet.  If told, put  ice on the painful area. It may be helpful to use ice between your warm baths. ? Put ice in a plastic bag. ? Place a towel between your skin and the bag. ? Leave the ice on for 20 minutes, 2-3 times a day.   General instructions  Take over-the-counter and prescription medicines only as told by your doctor. ? Medicated creams and medicines may be used as told.  Exercise often. Ask your doctor how much and what kind of exercise is best for you.  Go to the bathroom when  you have the urge to poop. Do not wait.  Avoid pushing too hard when you poop.  Keep your butt dry and clean. Use wet toilet paper or moist towelettes after pooping.  Do not sit on the toilet for a long time.  Keep all follow-up visits as told by your doctor. This is important. Contact a doctor if you:  Have pain and swelling that do not get better with treatment or medicine.  Have trouble pooping.  Cannot poop.  Have pain or swelling outside the area of the hemorrhoids. Get help right away if you have:  Bleeding that will not stop. Summary  Hemorrhoids are swollen veins in the butt or around the opening of the butt.  They can cause pain, itching, or bleeding.  Eat foods that have a lot of fiber in them. These include whole grains, beans, nuts, fruits, and vegetables.  Take a warm-water bath (sitz bath) for 20 minutes to ease pain. Do this 3-4 times a day. This information is not intended to replace advice given to you by your health care provider. Make sure you discuss any questions you have with your health care provider. Document Revised: 03/10/2018 Document Reviewed: 07/22/2017 Elsevier Patient Education  2021 [employer].

## 2020-06-10 NOTE — Anesthesia Procedure Notes (Signed)
Date/Time: 06/10/2020 9:47 AM Performed by: Julian Reil, CRNA Pre-anesthesia Checklist: Patient identified, Emergency Drugs available, Suction available and Patient being monitored Patient Re-evaluated:Patient Re-evaluated prior to induction Oxygen Delivery Method: Nasal cannula Induction Type: IV induction Placement Confirmation: positive ETCO2

## 2020-06-10 NOTE — H&P (Addendum)
Primary Care Physician:  Benita Stabile, MD Primary Gastroenterologist:  Dr. Marletta Lor  Pre-Procedure History & Physical: HPI:  Katie Davis is a 65 y.o. female is here for a colonoscopy for colon cancer screening purposes.   Past Medical History:  Diagnosis Date   Abnormal glandular Papanicolaou smear of cervix    Anxiety    Asthma    Body mass index 30.0-30.9, adult    Celiac disease    Chronic vascular disorder of intestine (HCC)    Fibromyalgia    Graves disease    Headache    Migrains   Hyperlipidemia    Hypertension    Hypothyroidism    IBS (irritable bowel syndrome) With constipation    Lupus (systemic lupus erythematosus) (HCC)    Obesity, unspecified    Osteoporosis    Raynaud disease    Sjogren's disease (HCC)     Past Surgical History:  Procedure Laterality Date   APPENDECTOMY     CHOLECYSTECTOMY     OTHER SURGICAL HISTORY     biopsies on both feet   REDUCTION MAMMAPLASTY      Prior to Admission medications   Medication Sig Start Date End Date Taking? Authorizing Provider  acidophilus (RISAQUAD) CAPS capsule Take 1 capsule by mouth daily.   Yes [provider]  albuterol (VENTOLIN HFA) 108 (90 Base) MCG/ACT inhaler Inhale 1-2 puffs into the lungs every 6 (six) hours as needed for wheezing or shortness of breath. 03/18/19  Yes Avegno, Zachery Dakins, FNP  Ascorbic Acid (VITAMIN C) 500 MG CHEW Chew by mouth daily.    Yes [provider]  atenolol (TENORMIN) 50 MG tablet Take 50 mg by mouth 2 (two) times daily.    Yes [provider]  B-Complex TABS Take by mouth daily.    Yes [provider]  calcium carbonate (TUMS - DOSED IN MG ELEMENTAL CALCIUM) 500 MG chewable tablet Chew 1 tablet by mouth daily.   Yes [provider]  Cholecalciferol (VITAMIN D3 GUMMIES) 25 MCG (1000 UT) CHEW Chew 6,000 Units by mouth daily.   Yes [provider]  clonazePAM (KLONOPIN) 0.5 MG tablet Take 0.25-0.5 mg by mouth 2 (two) times daily  as needed for anxiety.    Yes [provider]  cyclobenzaprine (FLEXERIL) 10 MG tablet Take 10 mg by mouth 3 (three) times daily as needed for muscle spasms.  07/07/19  Yes [provider]  ELDERBERRY PO Take 1 capsule by mouth daily.   Yes [provider]  fluorometholone (FML) 0.1 % ophthalmic suspension Place 1 drop into both eyes in the morning and at bedtime. 10/25/19  Yes [provider]  fluticasone (FLOVENT HFA) 110 MCG/ACT inhaler Inhale 1 puff into the lungs as needed.   Yes [provider]  ibuprofen (ADVIL,MOTRIN) 800 MG tablet Take 800 mg by mouth every 8 (eight) hours as needed for moderate pain.    Yes [provider]  linaclotide Karlene Einstein) 72 MCG capsule Take 1 capsule (72 mcg total) by mouth daily before breakfast. 05/24/20  Yes Anice Paganini, NP  meclizine (ANTIVERT) 12.5 MG tablet Take 12.5-25 mg by mouth every 6 (six) hours as needed for dizziness.    Yes Benita Stabile, MD  Omega-3 1000 MG CAPS Take 1,000 mg by mouth 2 (two) times daily with a meal.    Yes [provider]  ondansetron (ZOFRAN) 4 MG tablet Take 1 tablet (4 mg total) by mouth every 8 (eight) hours as needed for nausea or  vomiting. 03/18/19  Yes Avegno, Zachery Dakins, FNP  oxyCODONE-acetaminophen (PERCOCET/ROXICET) 5-325 MG tablet Take 1 tablet by mouth 2 (two) times daily as needed for moderate pain or severe pain.    Yes [provider]  Polyethyl Glycol-Propyl Glycol (SYSTANE OP) Place 1 drop into both eyes daily.   Yes [provider]  RESTASIS 0.05 % ophthalmic emulsion Place 1 drop into both eyes 2 (two) times daily. 10/25/19  Yes [provider]  rosuvastatin (CRESTOR) 5 MG tablet Take 5 mg by mouth 3 (three) times a week. 03/05/20  Yes [provider]  SYNTHROID 112 MCG tablet Take 112 mcg by mouth daily. 04/19/20  Yes [provider]    Allergies as of 05/07/2020 - Review Complete 05/07/2020  Allergen Reaction  Noted   Bactrim [sulfamethoxazole-trimethoprim] Nausea And Vomiting 11/16/2018   Doxycycline  05/07/2020   Gluten meal  11/16/2018   Keflex [cephalexin]  12/09/2018   Latex Hives 11/16/2018   Levaquin [levofloxacin] Nausea And Vomiting 11/16/2018   Sulfa antibiotics Nausea And Vomiting 11/16/2018   Contrast media [iodinated diagnostic agents] Nausea And Vomiting and Rash 11/16/2018    Family History  Problem Relation Age of Onset   Congestive Heart Failure Mother    Aneurysm Father    Celiac disease Brother    Melanoma Brother    Aneurysm Brother        stomach   Stroke Sister    Celiac disease Sister    Irritable bowel syndrome Sister    Celiac disease Brother    Aneurysm Sister    Other Sister        multiple back surgeries   Colon cancer Neg Hx     Social History   Socioeconomic History   Marital status: Married    Spouse name: Not on file   Number of children: Not on file   Years of education: Not on file   Highest education level: Not on file  Occupational History   Not on file  Tobacco Use   Smoking status: Never Smoker   Smokeless tobacco: Never Used  Vaping Use   Vaping Use: Never used  Substance and Sexual Activity   Alcohol use: Yes    Comment: wine occ   Drug use: Never   Sexual activity: Not Currently    Birth control/protection: Post-menopausal  Other Topics Concern   Not on file  Social History Narrative   Not on file   Social Determinants of Health   Financial Resource Strain: Low Risk    Difficulty of Paying Living Expenses: Not hard at all  Food Insecurity: No Food Insecurity   Worried About Programme researcher, broadcasting/film/video in the Last Year: Never true   Barista in the Last Year: Never true  Transportation Needs: No Transportation Needs   Lack of Transportation (Medical): No   Lack of Transportation (Non-Medical): No  Physical Activity: Insufficiently Active   Days of Exercise per Week: 2 days   Minutes of Exercise per Session: 10 min   Stress: Stress Concern Present   Feeling of Stress : To some extent  Social Connections: Moderately Integrated   Frequency of Communication with Friends and Family: Three times a week   Frequency of Social Gatherings with Friends and Family: Once a week   Attends Religious Services: More than 4 times per year   Active Member of [employer] or Organizations: No   Attends Banker Meetings: Never   Marital Status: Married  Catering manager  Violence: Not At Risk   Fear of Current or Ex-Partner: No   Emotionally Abused: No   Physically Abused: No   Sexually Abused: No    Review of Systems: See HPI, otherwise negative ROS  Physical Exam: Vital signs in last 24 hours: Temp:  [98.4 F (36.9 C)] 98.4 F (36.9 C) (03/28 0852) Pulse Rate:  [73] 73 (03/28 0852) Resp:  [19] 19 (03/28 0852) BP: (130)/(69) 130/69 (03/28 0852) SpO2:  [100 %] 100 % (03/28 0852) Weight:  [78.5 kg] 78.5 kg (03/28 0852)   General:   Alert,  Well-developed, well-nourished, pleasant and cooperative in NAD Head:  Normocephalic and atraumatic. Eyes:  Sclera clear, no icterus.   Conjunctiva pink. Ears:  Normal auditory acuity. Nose:  No deformity, discharge,  or lesions. Mouth:  No deformity or lesions, dentition normal. Neck:  Supple; no masses or thyromegaly. Lungs:  Clear throughout to auscultation.   No wheezes, crackles, or rhonchi. No acute distress. Heart:  Regular rate and rhythm; no murmurs, clicks, rubs,  or gallops. Abdomen:  Soft, nontender and nondistended. No masses, hepatosplenomegaly or hernias noted. Normal bowel sounds, without guarding, and without rebound.   Msk:  Symmetrical without gross deformities. Normal posture. Extremities:  Without clubbing or edema. Neurologic:  Alert and  oriented x4;  grossly normal neurologically. Skin:  Intact without significant lesions or rashes. Cervical Nodes:  No significant cervical adenopathy. Psych:  Alert and cooperative. Normal mood and  affect.  Impression/Plan: Katie Davis is here for a colonoscopy for colon cancer screening purposes.   The risks of the procedure including infection, bleed, or perforation as well as benefits, limitations, alternatives and imponderables have been reviewed with the patient. Questions have been answered. All parties agreeable.

## 2020-06-10 NOTE — Transfer of Care (Signed)
Immediate Anesthesia Transfer of Care Note  Patient: Katie Davis  Procedure(s) Performed: COLONOSCOPY WITH PROPOFOL (N/A )  Patient Location: Endoscopy Unit  Anesthesia Type:General  Level of Consciousness: awake, alert  and oriented  Airway & Oxygen Therapy: Patient Spontanous Breathing  Post-op Assessment: Report given to RN and Post -op Vital signs reviewed and stable  Post vital signs: Reviewed and stable  Last Vitals:  Vitals Value Taken Time  BP 91/53 06/10/20 1006  Temp 36.6 C 06/10/20 1006  Pulse 60 06/10/20 1006  Resp 16 06/10/20 1006  SpO2 96 % 06/10/20 1006    Last Pain:  Vitals:   06/10/20 1006  TempSrc: Oral  PainSc: 0-No pain      Patients Stated Pain Goal: 6 (06/10/20 6153)  Complications: No complications documented.

## 2020-06-10 NOTE — Op Note (Addendum)
[facility] Patient Name: Katie Davis Procedure Date: 06/10/2020 9:31 AM MRN: [id number] Date of Birth: Sep 24, 1955 Attending MD: Elon Alas. Abbey Chatters DO CSN: [id number] Age: 65 Admit Type: Outpatient Procedure:                Colonoscopy Indications:              Screening for colorectal malignant neoplasm Providers:                Elon Alas. Abbey Chatters, DO, Caprice Kluver, Raphael Gibney,                            Technician Referring MD:              Medicines:                See the Anesthesia note for documentation of the                            administered medications Complications:            No immediate complications. Estimated Blood Loss:     Estimated blood loss: none. Procedure:                Pre-Anesthesia Assessment:                           - The anesthesia plan was to use monitored                            anesthesia care (MAC).                           After obtaining informed consent, the colonoscope                            was passed under direct vision. Throughout the                            procedure, the patient's blood pressure, pulse, and                            oxygen saturations were monitored continuously. The                            PCF-HQ190L ([id number]) scope was introduced through                            the anus and advanced to the the cecum, identified                            by appendiceal orifice and ileocecal valve. The                            colonoscopy was performed without difficulty. The                            patient tolerated the procedure well. The quality  of the bowel preparation was evaluated using the                            BBPS [facility], The Bowel Preparation Scale) with scores                            of: Right Colon = 2 (minor amount of residual                            staining, small fragments of stool and/or opaque                            liquid, but mucosa seen well),  Transverse Colon = 2                            (minor amount of residual staining, small fragments                            of stool and/or opaque liquid, but mucosa seen                            well) and Left Colon = 2 (minor amount of residual                            staining, small fragments of stool and/or opaque                            liquid, but mucosa seen well). The total BBPS score                            equals 6. The quality of the bowel preparation was                            fair. Scope In: 9:48:17 AM Scope Out: 10:01:02 AM Scope Withdrawal Time: 0 hours 9 minutes 39 seconds  Total Procedure Duration: 0 hours 12 minutes 45 seconds  Findings:      The perianal and digital rectal examinations were normal.      Non-bleeding internal hemorrhoids were found during endoscopy.      Multiple small and large-mouthed diverticula were found in the sigmoid       colon and descending colon.      The exam was otherwise without abnormality. Impression:               - Preparation of the colon was fair.                           - Non-bleeding internal hemorrhoids.                           - Diverticulosis in the sigmoid colon and in the                            descending colon.                           -  The examination was otherwise normal.                           - No specimens collected. Moderate Sedation:      Per Anesthesia Care Recommendation:           - Patient has a contact number available for                            emergencies. The signs and symptoms of potential                            delayed complications were discussed with the                            patient. Return to normal activities tomorrow.                            Written discharge instructions were provided to the                            patient.                           - Resume previous diet.                           - Continue present medications.                            - Repeat colonoscopy in 10 years for screening                            purposes.                           - Return to GI clinic in 3 months.                           - Use Linzess (linaclotide) 145 mcg PO daily. Procedure Code(s):        --- Professional ---                           O2703, Colorectal cancer screening; colonoscopy on                            individual not meeting criteria for high risk Diagnosis Code(s):        --- Professional ---                           Z12.11, Encounter for screening for malignant                            neoplasm of colon                           K64.8, Other hemorrhoids  K57.30, Diverticulosis of large intestine without                            perforation or abscess without bleeding CPT copyright 2019 American Medical Association. All rights reserved. The codes documented in this report are preliminary and upon coder review may  be revised to meet current compliance requirements. Elon Alas. Abbey Chatters, DO Kenney Abbey Chatters, DO 06/10/2020 10:03:38 AM This report has been signed electronically. Number of Addenda: 0

## 2020-06-10 NOTE — Anesthesia Preprocedure Evaluation (Signed)
Anesthesia Evaluation  Patient identified by MRN, date of birth, ID band Patient awake    Reviewed: Allergy & Precautions, NPO status , Patient's Chart, lab work & pertinent test results, reviewed documented beta blocker date and time   Airway Mallampati: II  TM Distance: >3 FB Neck ROM: Full    Dental  (+) Dental Advisory Given, Partial Upper   Pulmonary asthma , neg COPD,  COPD inhaler,    Pulmonary exam normal        Cardiovascular Exercise Tolerance: Good hypertension, Pt. on medications and Pt. on home beta blockers Normal cardiovascular exam Rhythm:Regular Rate:Normal     Neuro/Psych  Headaches, Anxiety  Neuromuscular disease    GI/Hepatic negative GI ROS, Neg liver ROS,   Endo/Other  Hypothyroidism   Renal/GU negative Renal ROS     Musculoskeletal  (+) Fibromyalgia - (occasional percocet)  Abdominal Normal abdominal exam  (+)   Peds  Hematology negative hematology ROS (+)   Anesthesia Other Findings SLE, Raynaud's disease  Reproductive/Obstetrics                             Anesthesia Physical Anesthesia Plan  ASA: II  Anesthesia Plan: General   Post-op Pain Management:    Induction:   PONV Risk Score and Plan: Propofol infusion  Airway Management Planned: Nasal Cannula and Natural Airway  Additional Equipment:   Intra-op Plan:   Post-operative Plan:   Informed Consent: I have reviewed the patients History and Physical, chart, labs and discussed the procedure including the risks, benefits and alternatives for the proposed anesthesia with the patient or authorized representative who has indicated his/her understanding and acceptance.     Dental advisory given  Plan Discussed with: CRNA and Surgeon  Anesthesia Plan Comments:         Anesthesia Quick Evaluation

## 2020-06-17 ENCOUNTER — Encounter (HOSPITAL_COMMUNITY): Payer: Self-pay | Admitting: Internal Medicine

## 2020-06-24 ENCOUNTER — Telehealth: Payer: Self-pay | Admitting: Internal Medicine

## 2020-06-24 NOTE — Telephone Encounter (Signed)
[phone] PLEASE CALL PATIENT   HAVING STOMACH ISSUES AND WANTS TO KNOW IF IT COULD HAVE COME FROM HER PROCEDURE

## 2020-06-26 ENCOUNTER — Encounter: Payer: Self-pay | Admitting: Gastroenterology

## 2020-06-26 NOTE — Telephone Encounter (Signed)
Phoned and LM on vm of the pt to return call. 

## 2020-06-28 NOTE — Telephone Encounter (Signed)
Phoned and spoke with the pt regarding her alternating her samples of Linzess 145 mcg and 72 mcg. She stated sometimes the 145 mcg are to strong so she went to 72 mcg. (the pt is doing a combo with the two because there are days when she doesn't need as much). Also pt states she is much better since doing that because it is even helping her with her gas pockets she was having. Pt is improving and just wanted to advise [place].

## 2020-07-10 ENCOUNTER — Telehealth: Payer: Self-pay

## 2020-07-10 NOTE — Telephone Encounter (Signed)
Every test has the potential for false positive or false negative. Also, it's possible to have gluten sensitivity but not be technically "fully allergic" to gluten. If she's had celiac or gluten sensitivity her whole life and avoiding gluten foods helps her feel better than it's ok to assume she has celiac disease and avoid gluten.  No, strawberries do not have gluten (unless they came in contact with another wheat-like food product and had cross contamination).  I've printed an extensive guide to foods to avoid with gluten sensitivity.  Let me know if any other questions.

## 2020-07-10 NOTE — Telephone Encounter (Signed)
This pt called yesterday evening @ 3:14 pm, stating to me she does not have a intolerance to gluten, but bloodwork is stating otherwise. She states that she has Celiac for life. Pt wants to know is strawberries included with the things she should not eat. She has a upcoming appt in June for F/U . Wants to speak to a Dr regarding this gluten situation.

## 2020-07-10 NOTE — Telephone Encounter (Signed)
FYI: phoned and spoke with the pt and advised I was sending this package of information to her and she declined it. Her question to Minerva Areola was about her diverticulosis. But she knows what/what not to eat. Pt thanks [place] anyway.

## 2020-07-17 DIAGNOSIS — F411 Generalized anxiety disorder: Secondary | ICD-10-CM | POA: Diagnosis not present

## 2020-07-17 DIAGNOSIS — I10 Essential (primary) hypertension: Secondary | ICD-10-CM | POA: Diagnosis not present

## 2020-07-17 DIAGNOSIS — K900 Celiac disease: Secondary | ICD-10-CM | POA: Diagnosis not present

## 2020-07-17 DIAGNOSIS — M797 Fibromyalgia: Secondary | ICD-10-CM | POA: Diagnosis not present

## 2020-07-23 ENCOUNTER — Ambulatory Visit: Payer: Medicare Other | Admitting: Obstetrics & Gynecology

## 2020-08-06 ENCOUNTER — Ambulatory Visit: Payer: 59 | Admitting: Nurse Practitioner

## 2020-08-07 ENCOUNTER — Other Ambulatory Visit (HOSPITAL_COMMUNITY): Payer: Self-pay | Admitting: Family Medicine

## 2020-08-07 DIAGNOSIS — R509 Fever, unspecified: Secondary | ICD-10-CM | POA: Diagnosis not present

## 2020-08-07 DIAGNOSIS — R0600 Dyspnea, unspecified: Secondary | ICD-10-CM | POA: Diagnosis not present

## 2020-08-08 ENCOUNTER — Ambulatory Visit (HOSPITAL_COMMUNITY)
Admission: RE | Admit: 2020-08-08 | Discharge: 2020-08-08 | Disposition: A | Discharge status: Home / Self Care | Payer: Medicare Other | Source: Ambulatory Visit | Attending: Family Medicine | Admitting: Family Medicine

## 2020-08-08 ENCOUNTER — Other Ambulatory Visit: Payer: Self-pay

## 2020-08-08 DIAGNOSIS — R0600 Dyspnea, unspecified: Secondary | ICD-10-CM | POA: Diagnosis not present

## 2020-08-08 DIAGNOSIS — R509 Fever, unspecified: Secondary | ICD-10-CM | POA: Diagnosis not present

## 2020-08-14 ENCOUNTER — Ambulatory Visit: Payer: 59 | Admitting: Gastroenterology

## 2020-08-15 ENCOUNTER — Ambulatory Visit: Payer: Self-pay | Admitting: Obstetrics & Gynecology

## 2020-08-25 ENCOUNTER — Emergency Department (HOSPITAL_COMMUNITY): Payer: Medicare Other

## 2020-08-25 ENCOUNTER — Encounter (HOSPITAL_COMMUNITY): Payer: Self-pay | Admitting: *Deleted

## 2020-08-25 ENCOUNTER — Emergency Department (HOSPITAL_COMMUNITY)
Admission: EM | Admit: 2020-08-25 | Discharge: 2020-08-25 | Disposition: A | Discharge status: Home / Self Care | Payer: Medicare Other | Attending: Emergency Medicine | Admitting: Emergency Medicine

## 2020-08-25 ENCOUNTER — Other Ambulatory Visit: Payer: Self-pay

## 2020-08-25 DIAGNOSIS — W19XXXA Unspecified fall, initial encounter: Secondary | ICD-10-CM

## 2020-08-25 DIAGNOSIS — Y92009 Unspecified place in unspecified non-institutional (private) residence as the place of occurrence of the external cause: Secondary | ICD-10-CM | POA: Insufficient documentation

## 2020-08-25 DIAGNOSIS — M542 Cervicalgia: Secondary | ICD-10-CM | POA: Diagnosis not present

## 2020-08-25 DIAGNOSIS — M4602 Spinal enthesopathy, cervical region: Secondary | ICD-10-CM | POA: Diagnosis not present

## 2020-08-25 DIAGNOSIS — Z7951 Long term (current) use of inhaled steroids: Secondary | ICD-10-CM | POA: Insufficient documentation

## 2020-08-25 DIAGNOSIS — R519 Headache, unspecified: Secondary | ICD-10-CM | POA: Insufficient documentation

## 2020-08-25 DIAGNOSIS — I10 Essential (primary) hypertension: Secondary | ICD-10-CM | POA: Insufficient documentation

## 2020-08-25 DIAGNOSIS — J45909 Unspecified asthma, uncomplicated: Secondary | ICD-10-CM | POA: Diagnosis not present

## 2020-08-25 DIAGNOSIS — M2578 Osteophyte, vertebrae: Secondary | ICD-10-CM | POA: Diagnosis not present

## 2020-08-25 DIAGNOSIS — Z91040 Latex allergy status: Secondary | ICD-10-CM | POA: Insufficient documentation

## 2020-08-25 DIAGNOSIS — M25512 Pain in left shoulder: Secondary | ICD-10-CM | POA: Diagnosis not present

## 2020-08-25 DIAGNOSIS — Q761 Klippel-Feil syndrome: Secondary | ICD-10-CM | POA: Diagnosis not present

## 2020-08-25 DIAGNOSIS — S300XXA Contusion of lower back and pelvis, initial encounter: Secondary | ICD-10-CM | POA: Insufficient documentation

## 2020-08-25 DIAGNOSIS — W07XXXA Fall from chair, initial encounter: Secondary | ICD-10-CM | POA: Insufficient documentation

## 2020-08-25 DIAGNOSIS — Z79899 Other long term (current) drug therapy: Secondary | ICD-10-CM | POA: Insufficient documentation

## 2020-08-25 DIAGNOSIS — R110 Nausea: Secondary | ICD-10-CM | POA: Insufficient documentation

## 2020-08-25 DIAGNOSIS — M545 Low back pain, unspecified: Secondary | ICD-10-CM | POA: Diagnosis not present

## 2020-08-25 DIAGNOSIS — S3992XA Unspecified injury of lower back, initial encounter: Secondary | ICD-10-CM | POA: Diagnosis present

## 2020-08-25 DIAGNOSIS — R0902 Hypoxemia: Secondary | ICD-10-CM | POA: Diagnosis not present

## 2020-08-25 DIAGNOSIS — E039 Hypothyroidism, unspecified: Secondary | ICD-10-CM | POA: Diagnosis not present

## 2020-08-25 DIAGNOSIS — S199XXA Unspecified injury of neck, initial encounter: Secondary | ICD-10-CM | POA: Diagnosis not present

## 2020-08-25 DIAGNOSIS — S0990XA Unspecified injury of head, initial encounter: Secondary | ICD-10-CM | POA: Diagnosis not present

## 2020-08-25 DIAGNOSIS — M549 Dorsalgia, unspecified: Secondary | ICD-10-CM | POA: Diagnosis not present

## 2020-08-25 MED ORDER — ACETAMINOPHEN 325 MG PO TABS
650.0000 mg | ORAL_TABLET | Freq: Once | ORAL | Status: AC
  Administered 2020-08-25: 650 mg via ORAL
  Filled 2020-08-25: qty 2

## 2020-08-25 MED ORDER — ONDANSETRON 4 MG PO TBDP
4.0000 mg | ORAL_TABLET | Freq: Once | ORAL | Status: AC
  Administered 2020-08-25: 4 mg via ORAL
  Filled 2020-08-25: qty 1

## 2020-08-25 MED ORDER — ONDANSETRON HCL 4 MG PO TABS: 4.0000 mg | ORAL_TABLET | Freq: Three times a day (TID) | ORAL | 0 refills | Status: DC | PRN

## 2020-08-25 NOTE — Discharge Instructions (Addendum)
Your xrays show no signs of broken bones Tylenol or motrin for pain Zofran for nausea

## 2020-08-25 NOTE — ED Triage Notes (Signed)
Pt c/o neck pain and EMS applied c-collar on scene.

## 2020-08-25 NOTE — ED Triage Notes (Signed)
Pt went to sit down on a rolling computer chair, chair rolled out from under her and pt fell on floor at home, striking her neck and head on a cabinet, denies LOC. Pt states she landed on left side of bottom and back. Pt was outside when EMS arrived.

## 2020-08-25 NOTE — ED Provider Notes (Signed)
[facility] EMERGENCY DEPARTMENT Provider Note   CSN: [id number] Arrival date & time: 08/25/20  1530     History Chief Complaint  Patient presents with   Katie Davis    Katie Davis is a 65 y.o. female.   Fall  This patient is a 65 year old female, she has a history of multiple medical problems including lupus Sjogren's and Raynaud's disease, she has a history of celiac disease fibromyalgia Graves' disease hypertension and hyperlipidemia.  The patient reports that while she was working today she went to sit down on a chair that had wheels, she sat on the edge of the chair, the chair slid out from underneath her and she fell backwards landing on her lower back and that her head went back and her neck struck something hard.  She now has neck pain, lower back pain and left shoulder pain.  Symptoms are persistent, worse with range of motion, associated with nausea and a mild headache.  This occurred just prior to arrival.  Past Medical History:  Diagnosis Date   Abnormal glandular Papanicolaou smear of cervix    Anxiety    Asthma    Body mass index 30.0-30.9, adult    Celiac disease    Chronic vascular disorder of intestine (HCC)    Fibromyalgia    Graves disease    Headache    Migrains   Hyperlipidemia    Hypertension    Hypothyroidism    IBS (irritable bowel syndrome) With constipation    Lupus (systemic lupus erythematosus) (HCC)    Obesity, unspecified    Osteoporosis    Raynaud disease    Sjogren's disease (HCC)     Patient Active Problem List   Diagnosis Date Noted   Constipation 05/07/2020   Preventative health care 05/07/2020   Nausea without vomiting 05/07/2020   Abdominal pain 10/24/2019   Postmenopausal 12/14/2018   Vasomotor symptoms due to menopause 12/14/2018   History of abnormal cervical Pap smear 12/14/2018    Past Surgical History:  Procedure Laterality Date   APPENDECTOMY     CHOLECYSTECTOMY     COLONOSCOPY WITH PROPOFOL N/A 06/10/2020   Procedure:  COLONOSCOPY WITH PROPOFOL;  Surgeon: Lanelle Balarver, Charles K, DO;  Location: [facility];  Service: Endoscopy;  Laterality: N/A;  am appt   OTHER SURGICAL HISTORY     biopsies on both feet   REDUCTION MAMMAPLASTY       OB History     Gravida  3   Para      Term      Preterm      AB  3   Living  0      SAB  3   IAB      Ectopic      Multiple      Live Births              Family History  Problem Relation Age of Onset   Congestive Heart Failure Mother    Aneurysm Father    Celiac disease Brother    Melanoma Brother    Aneurysm Brother        stomach   Stroke Sister    Celiac disease Sister    Irritable bowel syndrome Sister    Celiac disease Brother    Aneurysm Sister    Other Sister        multiple back surgeries   Colon cancer Neg Hx     Social History   Tobacco Use   Smoking status: Never  Smokeless tobacco: Never  Vaping Use   Vaping Use: Never used  Substance Use Topics   Alcohol use: Yes    Comment: wine occ   Drug use: Never    Home Medications Prior to Admission medications   Medication Sig Start Date End Date Taking? Authorizing Provider  acidophilus (RISAQUAD) CAPS capsule Take 1 capsule by mouth daily.    [provider]  albuterol (VENTOLIN HFA) 108 (90 Base) MCG/ACT inhaler Inhale 1-2 puffs into the lungs every 6 (six) hours as needed for wheezing or shortness of breath. 03/18/19   Avegno, Zachery Dakins, FNP  Ascorbic Acid (VITAMIN C) 500 MG CHEW Chew by mouth daily.     [provider]  atenolol (TENORMIN) 50 MG tablet Take 50 mg by mouth 2 (two) times daily.     [provider]  B-Complex TABS Take by mouth daily.     [provider]  calcium carbonate (TUMS - DOSED IN MG ELEMENTAL CALCIUM) 500 MG chewable tablet Chew 1 tablet by mouth daily.    [provider]  Cholecalciferol (VITAMIN D3 GUMMIES) 25 MCG (1000 UT) CHEW Chew 6,000 Units by mouth daily.    [provider]  clonazePAM  (KLONOPIN) 0.5 MG tablet Take 0.25-0.5 mg by mouth 2 (two) times daily as needed for anxiety.     [provider]  cyclobenzaprine (FLEXERIL) 10 MG tablet Take 10 mg by mouth 3 (three) times daily as needed for muscle spasms.  07/07/19   [provider]  ELDERBERRY PO Take 1 capsule by mouth daily.    [provider]  fluorometholone (FML) 0.1 % ophthalmic suspension Place 1 drop into both eyes in the morning and at bedtime. 10/25/19   [provider]  fluticasone (FLOVENT HFA) 110 MCG/ACT inhaler Inhale 1 puff into the lungs as needed.    [provider]  ibuprofen (ADVIL,MOTRIN) 800 MG tablet Take 800 mg by mouth every 8 (eight) hours as needed for moderate pain.     [provider]  linaclotide Karlene Einstein) 145 MCG CAPS capsule Take 1 capsule (145 mcg total) by mouth daily before breakfast. 06/10/20 12/07/20  Lanelle Bal, DO  meclizine (ANTIVERT) 12.5 MG tablet Take 12.5-25 mg by mouth every 6 (six) hours as needed for dizziness.     Benita Stabile, MD  Omega-3 1000 MG CAPS Take 1,000 mg by mouth 2 (two) times daily with a meal.     [provider]  ondansetron (ZOFRAN) 4 MG tablet Take 1 tablet (4 mg total) by mouth every 8 (eight) hours as needed for nausea or vomiting. 08/25/20   Eber Hong, MD  oxyCODONE-acetaminophen (PERCOCET/ROXICET) 5-325 MG tablet Take 1 tablet by mouth 2 (two) times daily as needed for moderate pain or severe pain.     [provider]  Polyethyl Glycol-Propyl Glycol (SYSTANE OP) Place 1 drop into both eyes daily.    [provider]  RESTASIS 0.05 % ophthalmic emulsion Place 1 drop into both eyes 2 (two) times daily. 10/25/19   [provider]  rosuvastatin (CRESTOR) 5 MG tablet Take 5 mg by mouth 3 (three) times a week. 03/05/20   [provider]  SYNTHROID 112 MCG tablet Take 112 mcg by mouth daily. 04/19/20   [provider]    Allergies    Bactrim  [sulfamethoxazole-trimethoprim], Doxycycline, Gluten meal, Keflex [cephalexin], Latex, Levaquin [levofloxacin], Sulfa antibiotics, and Contrast media [iodinated diagnostic agents]  Review of Systems   Review of Systems  All other systems reviewed and are negative.  Physical Exam Updated Vital Signs BP (!) 148/81 (BP Location: Right Arm)   Pulse 87   Temp 98.8 F (37.1 C) (Oral)   Resp 18   Ht 1.6 m (5\' 3" )   Wt 78.9 kg   SpO2 99%   BMI 30.82 kg/m   Physical Exam Vitals and nursing note reviewed.  Constitutional:      General: She is not in acute distress.    Appearance: She is well-developed.  HENT:     Head: Normocephalic and atraumatic.     Comments: No visible or palpable signs of trauma to the head    Mouth/Throat:     Pharynx: No oropharyngeal exudate.  Eyes:     General: No scleral icterus.       Right eye: No discharge.        Left eye: No discharge.     Conjunctiva/sclera: Conjunctivae normal.     Pupils: Pupils are equal, round, and reactive to light.  Neck:     Thyroid: No thyromegaly.     Vascular: No JVD.  Cardiovascular:     Rate and Rhythm: Normal rate and regular rhythm.     Heart sounds: Normal heart sounds. No murmur heard.   No friction rub. No gallop.  Pulmonary:     Effort: Pulmonary effort is normal. No respiratory distress.     Breath sounds: Normal breath sounds. No wheezing or rales.  Abdominal:     General: Bowel sounds are normal. There is no distension.     Palpations: Abdomen is soft. There is no mass.     Tenderness: There is no abdominal tenderness.  Musculoskeletal:        General: Tenderness present. Normal range of motion.     Cervical back: Normal range of motion and neck supple. Tenderness present.     Comments: Tenderness over the posterior elements of the spine as well as the paraspinal muscles, tenderness to the left shoulder, tenderness to the lumbar spine, normal appearing anatomy, no deformity.  All 4 extremities have supple  joints and soft compartments diffusely  Lymphadenopathy:     Cervical: No cervical adenopathy.  Skin:    General: Skin is warm and dry.     Findings: No erythema or rash.  Neurological:     Mental Status: She is alert.     Coordination: Coordination normal.     Comments: Awake alert and able to follow commands without difficulty  Psychiatric:        Behavior: Behavior normal.    ED Results / Procedures / Treatments   Labs (all labs ordered are listed, but only abnormal results are displayed) Labs Reviewed - No data to display  EKG None  Radiology DG Lumbar Spine Complete  Result Date: 08/25/2020 CLINICAL DATA:  Fall out of chair with low back pain. EXAM: LUMBAR SPINE - COMPLETE 4+ VIEW COMPARISON:  CT 07/26/2019 FINDINGS: Vertebral body alignment and heights are normal. There is mild spondylosis of the lumbar spine to include facet arthropathy. No evidence of compression fracture or spondylolisthesis/spondylolysis. IMPRESSION: 1. No acute findings. 2. Mild spondylosis of the lumbar spine. Electronically Signed   By: 09/25/2019 M.D.   On: 08/25/2020 16:42   CT Cervical Spine Wo Contrast  Result Date: 08/25/2020 CLINICAL DATA:  10/25/2020 and struck her head and neck on a cabinet today. EXAM: CT CERVICAL SPINE WITHOUT CONTRAST TECHNIQUE: Multidetector CT imaging of the cervical spine was performed without intravenous  contrast. Multiplanar CT image reconstructions were also generated. COMPARISON:  None. FINDINGS: Alignment: Normal. Skull base and vertebrae: No acute fracture. No primary bone lesion or focal pathologic process. Minimal Klippel-Feil deformity at the T1-2 level. Soft tissues and spinal canal: No prevertebral fluid or swelling. No visible canal hematoma. Disc levels: Mild anterior and minimal posterior spur formation at the C6-7 level. Minimal anterior spur formation at the C5-6 level. Upper chest: Clear lung apices. Other: None. IMPRESSION: 1. No fracture or subluxation. 2.  Minimal degenerative changes. Electronically Signed   By: Beckie Salts M.D.   On: 08/25/2020 17:45   DG Shoulder Left  Result Date: 08/25/2020 CLINICAL DATA:  Left shoulder pain following a fall. EXAM: LEFT SHOULDER - 2+ VIEW COMPARISON:  None. FINDINGS: There is no evidence of fracture or dislocation. There is no evidence of arthropathy or other focal bone abnormality. Soft tissues are unremarkable. IMPRESSION: Negative. Electronically Signed   By: Beckie Salts M.D.   On: 08/25/2020 16:41    Procedures Procedures   Medications Ordered in ED Medications  ondansetron (ZOFRAN-ODT) disintegrating tablet 4 mg (4 mg Oral Given 08/25/20 1602)  acetaminophen (TYLENOL) tablet 650 mg (650 mg Oral Given 08/25/20 1602)  ondansetron (ZOFRAN-ODT) disintegrating tablet 4 mg (4 mg Oral Given 08/25/20 1829)    ED Course  I have reviewed the triage vital signs and the nursing notes.  Pertinent labs & imaging results that were available during my care of the patient were reviewed by me and considered in my medical decision making (see chart for details).    MDM Rules/Calculators/A&P                           Imaging to rule out cervical spine fracture left shoulder fracture or lumbar spine fracture, the patient has pain and nausea, she had minimal head injury if at all, I think she is reasonable to not need a CT scan of the brain.  Not anticoagulated  The imaging was reviewed and thankfully is unremarkable, the patient was informed of these results and is very stable for discharge.  She was ambulatory in the emergency department prior to discharge  Final Clinical Impression(s) / ED Diagnoses Final diagnoses:  Fall, initial encounter  Lumbar contusion, initial encounter    Rx / DC Orders ED Discharge Orders          Ordered    ondansetron (ZOFRAN) 4 MG tablet  Every 8 hours PRN        08/25/20 1812             Eber Hong, MD 08/26/20 1447

## 2020-08-28 ENCOUNTER — Ambulatory Visit: Payer: Medicare Other | Admitting: Pulmonary Disease

## 2020-08-28 ENCOUNTER — Encounter: Payer: Self-pay | Admitting: Pulmonary Disease

## 2020-08-28 ENCOUNTER — Other Ambulatory Visit: Payer: Self-pay

## 2020-08-28 VITALS — BP 122/80 | HR 70 | Temp 97.1°F | Ht 63.0 in | Wt 177.2 lb

## 2020-08-28 DIAGNOSIS — J4550 Severe persistent asthma, uncomplicated: Secondary | ICD-10-CM

## 2020-08-28 MED ORDER — FLUTICASONE FUROATE-VILANTEROL 100-25 MCG/INH IN AEPB: 1.0000 | INHALATION_SPRAY | Freq: Every day | RESPIRATORY_TRACT | 5 refills | Status: DC

## 2020-08-28 MED ORDER — ALBUTEROL SULFATE (2.5 MG/3ML) 0.083% IN NEBU: 2.5000 mg | INHALATION_SOLUTION | Freq: Four times a day (QID) | RESPIRATORY_TRACT | 12 refills | Status: DC | PRN

## 2020-08-28 MED ORDER — FLUTICASONE FUROATE-VILANTEROL 100-25 MCG/INH IN AEPB: 1.0000 | INHALATION_SPRAY | Freq: Every day | RESPIRATORY_TRACT | 0 refills | Status: DC

## 2020-08-28 NOTE — Patient Instructions (Signed)
Stop using flovent  Breo one puff daily, and rinse your mouth after each use  Will arrange for home nebulizer machine  Use albuterol in nebulizer twice daily for the next one week after you get nebulizer machine, then as needed  Will arrange for spacer device to use with albuterol inhaler  Follow up in 7 to 8 weeks

## 2020-08-28 NOTE — Progress Notes (Signed)
[facility]  Chief Complaint  Patient presents with   Consult    Having trouble taking deep breath since having covid in beginning of April 2022, on and off low grade fevers    Constitutional:  BP 122/80 (BP Location: Left Arm, Cuff Size: Normal)   Pulse 70   Temp (!) 97.1 F (36.2 C) (Temporal)   Ht 5\' 3"  (1.6 m)   Wt 177 lb 3.2 oz (80.4 kg)   SpO2 96% Comment: Room air  BMI 31.39 kg/m   Past Medical History:  Anxiety, Celiac disease, Fibromyalgia, Grave's disease, HLD, Migraine headaches, HTN, IBS, Lupus, Osteoporosis, Raynaud disease, Sjogren's disease  Past Surgical History:  She  has a past surgical history that includes Reduction mammaplasty; Cholecystectomy; Appendectomy; OTHER SURGICAL HISTORY; and Colonoscopy with propofol (N/A, 06/10/2020).  Brief Summary:  Katie Davis is a 65 y.o. female with cough.      Subjective:   She is from [place] originally, but has lived in [place] for the past few years.  She has history of mild asthma.  She developed a respiratory infection in April.  Initial COVID test was negative, but her symptoms were consistent with COVID.  She didn't repeat COVID testing.  She was treated with antibiotics and prednisone.  Prednisone made her feel very anxious.  Since then she has persistent cough, wheeze, chest tightness, and fatigue.  Chest xray from 08/08/20 showed scarring in the lingula.  She has report of chest xray from 08/10/20 in 2016 that was normal.  She doesn't smoke cigarettes.  Used to get pneumonia frequently when she was younger until she found out she has Celiac disease.  No history of tuberculosis.  She is not working.  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    She has been using flovent intermittently.  This helps.  Also using albuterol which helps.  Physical Exam:   Appearance - well kempt   ENMT - no sinus tenderness, no oral exudate, no LAN,  Mallampati 2 airway, no stridor  Respiratory - equal breath sounds bilaterally, no wheezing or rales  CV - s1s2 regular rate and rhythm, no murmurs  Ext - no clubbing, no edema  Skin - no rashes  Psych - normal mood and affect   Pulmonary testing:    Chest Imaging:    Social History:  She  reports that she has never smoked. She has never used smokeless tobacco. She reports current alcohol use. She reports that she does not use drugs.  Family History:  Her family history includes Aneurysm in her brother, father, and sister; Celiac disease in her brother, brother, and sister; Congestive Heart Failure in her mother; Irritable bowel syndrome in her sister; Melanoma in her brother; Other in her sister; Stroke in her sister.    Discussion:  She has persistent cough, wheeze, chest tightness, and fatigue after have respiratory infection in April 2022.  While her symptoms are suggestive of COVID 19 infection, these are non specific.  Explained she could have developed a respiratory infection from some other condition besides COVID.  I don't think she has infectious process going on at present.  I am more concerned that she has developed worsening asthma symptoms after her recent respiratory infection.  Assessment/Plan:   Severe, persistent asthma. - will have her start breo in place of flovent; sample of breo provided and demonstrated inhaler technique - will arrange for home nebulizer and she is to use  albuterol in nebulizer bid for the next week, and then prn after - prn albuterol inhaler; will arrange for spacer device to use with albuterol - will reassess status in few weeks, and then likely arrange for pulmonary function test - explained it can take several months to feel recovered from lower respiratory infections  Time Spent Involved in Patient Care on Day of Examination:  49 minutes  Follow up:   Patient Instructions  Stop using flovent  Breo one puff daily, and rinse your  mouth after each use  Will arrange for home nebulizer machine  Use albuterol in nebulizer twice daily for the next one week after you get nebulizer machine, then as needed  Will arrange for spacer device to use with albuterol inhaler  Follow up in 7 to 8 weeks  Medication List:   Allergies as of 08/28/2020       Reactions   Bactrim [sulfamethoxazole-trimethoprim] Nausea And Vomiting   Doxycycline    Made body feel crazy, increased heart rate   Gluten Meal    Ceiliac disease   Keflex [cephalexin]    Latex Hives   Levaquin [levofloxacin] Nausea And Vomiting   Sulfa Antibiotics Nausea And Vomiting   Contrast Media [iodinated Diagnostic Agents] Nausea And Vomiting, Rash        Medication List        Accurate as of August 28, 2020  2:04 PM. If you have any questions, ask your nurse or doctor.          STOP taking these medications    fluticasone 110 MCG/ACT inhaler Commonly known as: FLOVENT HFA Stopped by: Coralyn Helling, MD       TAKE these medications    acidophilus Caps capsule Take 1 capsule by mouth daily.   albuterol 108 (90 Base) MCG/ACT inhaler Commonly known as: VENTOLIN HFA Inhale 1-2 puffs into the lungs every 6 (six) hours as needed for wheezing or shortness of breath. What changed: Another medication with the same name was added. Make sure you understand how and when to take each. Changed by: Coralyn Helling, MD   albuterol (2.5 MG/3ML) 0.083% nebulizer solution Commonly known as: PROVENTIL Take 3 mLs (2.5 mg total) by nebulization every 6 (six) hours as needed for wheezing or shortness of breath. What changed: You were already taking a medication with the same name, and this prescription was added. Make sure you understand how and when to take each. Changed by: Coralyn Helling, MD   atenolol 50 MG tablet Commonly known as: TENORMIN Take 50 mg by mouth 2 (two) times daily.   B-Complex Tabs Take by mouth daily.   calcium carbonate 500 MG chewable  tablet Commonly known as: TUMS - dosed in mg elemental calcium Chew 1 tablet by mouth daily.   clonazePAM 0.5 MG tablet Commonly known as: KLONOPIN Take 0.25-0.5 mg by mouth 2 (two) times daily as needed for anxiety.   cyclobenzaprine 10 MG tablet Commonly known as: FLEXERIL Take 10 mg by mouth 3 (three) times daily as needed for muscle spasms.   ELDERBERRY PO Take 1 capsule by mouth daily.   fluorometholone 0.1 % ophthalmic suspension Commonly known as: FML Place 1 drop into both eyes in the morning and at bedtime.   fluticasone furoate-vilanterol 100-25 MCG/INH Aepb Commonly known as: Breo Ellipta Inhale 1 puff into the lungs daily. Started by: Coralyn Helling, MD   fluticasone furoate-vilanterol 100-25 MCG/INH Aepb Commonly known as: Breo Ellipta Inhale 1 puff into the lungs daily. Started by:  Coralyn Helling, MD   ibuprofen 800 MG tablet Commonly known as: ADVIL Take 800 mg by mouth every 8 (eight) hours as needed for moderate pain.   linaclotide 145 MCG Caps capsule Commonly known as: Linzess Take 1 capsule (145 mcg total) by mouth daily before breakfast.   meclizine 12.5 MG tablet Commonly known as: ANTIVERT Take 12.5-25 mg by mouth every 6 (six) hours as needed for dizziness.   Omega-3 1000 MG Caps Take 1,000 mg by mouth 2 (two) times daily with a meal.   ondansetron 4 MG tablet Commonly known as: Zofran Take 1 tablet (4 mg total) by mouth every 8 (eight) hours as needed for nausea or vomiting.   oxyCODONE-acetaminophen 5-325 MG tablet Commonly known as: PERCOCET/ROXICET Take 1 tablet by mouth 2 (two) times daily as needed for moderate pain or severe pain.   Restasis 0.05 % ophthalmic emulsion Generic drug: cycloSPORINE Place 1 drop into both eyes 2 (two) times daily.   rosuvastatin 5 MG tablet Commonly known as: CRESTOR Take 5 mg by mouth 3 (three) times a week.   Synthroid 112 MCG tablet Generic drug: levothyroxine Take 112 mcg by mouth daily.    SYSTANE OP Place 1 drop into both eyes daily.   Vitamin C 500 MG Chew Chew by mouth daily.   Vitamin D3 Gummies 25 MCG (1000 UT) Chew Generic drug: Cholecalciferol Chew 6,000 Units by mouth daily.        Signature:  Coralyn Helling, MD [facility] Pager - [phone] 08/28/2020, 2:04 PM

## 2020-08-30 DIAGNOSIS — E039 Hypothyroidism, unspecified: Secondary | ICD-10-CM | POA: Diagnosis not present

## 2020-08-30 DIAGNOSIS — I10 Essential (primary) hypertension: Secondary | ICD-10-CM | POA: Diagnosis not present

## 2020-09-03 ENCOUNTER — Institutional Professional Consult (permissible substitution): Payer: Medicare Other | Admitting: Internal Medicine

## 2020-09-05 ENCOUNTER — Other Ambulatory Visit (HOSPITAL_COMMUNITY): Payer: Self-pay | Admitting: Family Medicine

## 2020-09-05 DIAGNOSIS — R935 Abnormal findings on diagnostic imaging of other abdominal regions, including retroperitoneum: Secondary | ICD-10-CM | POA: Diagnosis not present

## 2020-09-05 DIAGNOSIS — E039 Hypothyroidism, unspecified: Secondary | ICD-10-CM | POA: Diagnosis not present

## 2020-09-05 DIAGNOSIS — M797 Fibromyalgia: Secondary | ICD-10-CM | POA: Diagnosis not present

## 2020-09-07 DIAGNOSIS — J4550 Severe persistent asthma, uncomplicated: Secondary | ICD-10-CM | POA: Diagnosis not present

## 2020-09-09 ENCOUNTER — Institutional Professional Consult (permissible substitution): Payer: Medicare Other | Admitting: Internal Medicine

## 2020-09-10 ENCOUNTER — Other Ambulatory Visit: Payer: Self-pay

## 2020-09-10 ENCOUNTER — Telehealth: Payer: Self-pay | Admitting: Internal Medicine

## 2020-09-10 ENCOUNTER — Ambulatory Visit (HOSPITAL_COMMUNITY)
Admission: RE | Admit: 2020-09-10 | Discharge: 2020-09-10 | Disposition: A | Discharge status: Home / Self Care | Payer: Medicare Other | Source: Ambulatory Visit | Attending: Family Medicine | Admitting: Family Medicine

## 2020-09-10 DIAGNOSIS — R935 Abnormal findings on diagnostic imaging of other abdominal regions, including retroperitoneum: Secondary | ICD-10-CM | POA: Insufficient documentation

## 2020-09-10 DIAGNOSIS — I700 Atherosclerosis of aorta: Secondary | ICD-10-CM | POA: Diagnosis not present

## 2020-09-10 DIAGNOSIS — R109 Unspecified abdominal pain: Secondary | ICD-10-CM | POA: Diagnosis not present

## 2020-09-10 DIAGNOSIS — Z9049 Acquired absence of other specified parts of digestive tract: Secondary | ICD-10-CM | POA: Diagnosis not present

## 2020-09-10 DIAGNOSIS — G8929 Other chronic pain: Secondary | ICD-10-CM | POA: Diagnosis not present

## 2020-09-10 NOTE — Telephone Encounter (Signed)
[phone]  please call patient about her tcs charges.  Something was on her post procedure report about an issue that was a pre-existing condition that was found 10 years ago by another physician.  She was questioning the codes that were used.  She has already spoken to billing and they suggested she call here.

## 2020-09-12 ENCOUNTER — Other Ambulatory Visit (HOSPITAL_COMMUNITY)
Admission: RE | Admit: 2020-09-12 | Discharge: 2020-09-12 | Disposition: A | Discharge status: Home / Self Care | Payer: Medicare Other | Source: Ambulatory Visit | Attending: Obstetrics & Gynecology | Admitting: Obstetrics & Gynecology

## 2020-09-12 ENCOUNTER — Ambulatory Visit (INDEPENDENT_AMBULATORY_CARE_PROVIDER_SITE_OTHER): Payer: Medicare Other | Admitting: Obstetrics & Gynecology

## 2020-09-12 ENCOUNTER — Encounter: Payer: Self-pay | Admitting: Obstetrics & Gynecology

## 2020-09-12 ENCOUNTER — Other Ambulatory Visit: Payer: Self-pay

## 2020-09-12 VITALS — BP 128/79 | HR 67 | Ht 63.0 in | Wt 176.0 lb

## 2020-09-12 DIAGNOSIS — L820 Inflamed seborrheic keratosis: Secondary | ICD-10-CM

## 2020-09-12 NOTE — Progress Notes (Signed)
PROCEDURE NOTE  PRE-OP DIAGNOSIS:  seborrheic keratosis right vulva  PROCEDURE:  Skin Lesion Excision(s)  INDICATIONS:  Katie Davis is a 65 y.o. female who presents for minor skin surgery.  The patient understands all risks, benefits, indications, potential complications, and alternatives, and freely consents for the procedure.  The patient also understands the option of performing no surgery, the risk for scarring, and the technique of the procedure.  ANESTHESIA:  Local.  TECHNIQUE:  After informed consent was obtained, and after the skin was prepped and draped, 1% lidocaine without epinephrine for anesthetic was injected around and underneath the site.   elliptical excision in total was performed.  3 3-0 etilon sutures were placed. Neosporin was placed. A dressing was applied and wound care instructions were provided.  [name] tolerated the procedure well and without complications.  The patient will be alert for any signs of cutaneous infection and will follow up as instructed.  Lazaro Arms, MD 09/12/2020 12:22 PM

## 2020-09-12 NOTE — Addendum Note (Signed)
Addended by: Colen Darling on: 09/12/2020 12:44 PM   Modules accepted: Orders

## 2020-09-17 LAB — SURGICAL PATHOLOGY

## 2020-09-18 ENCOUNTER — Telehealth: Payer: Self-pay | Admitting: Obstetrics & Gynecology

## 2020-09-18 NOTE — Telephone Encounter (Signed)
Patient called stating that she had a procedure done last week with Dr. Despina Hidden and she is coming this week to remove suture but patient states that the site is still red and hot to the touch and she wants to know if this is normal. Please contact pt

## 2020-09-18 NOTE — Telephone Encounter (Signed)
Returned pt's call. Pt states that the area is swollen, red, and irritated. It feels as if "it has it's own heartbeat." She stated that she'd been following Dr Forestine Chute directions on post-procedure care. From his procedural note, the area was already inflamed at the time of the procedure, so it may take longer for it to feel vast improvement. She denied any strange discharge or odor which would be concerning for an infection. She was instructed to continue doing what she's doing, monitor it for any changes, and keep her appt on Friday. She was asked to try some ice for 15 min, not directly on the skin, and see if that helped the swelling. She was also going to take some Advil for the pain.

## 2020-09-20 ENCOUNTER — Ambulatory Visit (INDEPENDENT_AMBULATORY_CARE_PROVIDER_SITE_OTHER): Payer: Medicare Other | Admitting: Obstetrics & Gynecology

## 2020-09-20 ENCOUNTER — Other Ambulatory Visit: Payer: Self-pay

## 2020-09-20 ENCOUNTER — Encounter: Payer: Self-pay | Admitting: Obstetrics & Gynecology

## 2020-09-20 VITALS — BP 111/75 | HR 71 | Ht 63.0 in | Wt 176.0 lb

## 2020-09-20 DIAGNOSIS — Z4802 Encounter for removal of sutures: Secondary | ICD-10-CM

## 2020-09-20 DIAGNOSIS — L820 Inflamed seborrheic keratosis: Secondary | ICD-10-CM

## 2020-09-20 NOTE — Progress Notes (Signed)
  HPI: Patient returns for routine postoperative follow-up having undergone office excision of seborrhiec keratosis of the right vulva on 09/12/20.  The patient's immediate postoperative recovery has been unremarkable. Since hospital discharge the patient reports local irritation.   Current Outpatient Medications: acidophilus (RISAQUAD) CAPS capsule, Take 1 capsule by mouth daily., Disp: , Rfl:  albuterol (PROVENTIL) (2.5 MG/3ML) 0.083% nebulizer solution, Take 3 mLs (2.5 mg total) by nebulization every 6 (six) hours as needed for wheezing or shortness of breath., Disp: 75 mL, Rfl: 12 Ascorbic Acid (VITAMIN C) 500 MG CHEW, Chew by mouth daily. , Disp: , Rfl:  atenolol (TENORMIN) 50 MG tablet, Take 50 mg by mouth 2 (two) times daily. , Disp: , Rfl:  B-Complex TABS, Take by mouth daily. , Disp: , Rfl:  calcium carbonate (TUMS - DOSED IN MG ELEMENTAL CALCIUM) 500 MG chewable tablet, Chew 1 tablet by mouth daily., Disp: , Rfl:  Cholecalciferol (VITAMIN D3 GUMMIES) 25 MCG (1000 UT) CHEW, Chew 6,000 Units by mouth daily., Disp: , Rfl:  clonazePAM (KLONOPIN) 0.5 MG tablet, Take 0.25-0.5 mg by mouth 2 (two) times daily as needed for anxiety. , Disp: , Rfl:  cyclobenzaprine (FLEXERIL) 10 MG tablet, Take 10 mg by mouth 3 (three) times daily as needed for muscle spasms. , Disp: , Rfl:  ELDERBERRY PO, Take 1 capsule by mouth daily., Disp: , Rfl:  fluorometholone (FML) 0.1 % ophthalmic suspension, Place 1 drop into both eyes in the morning and at bedtime., Disp: , Rfl:  fluticasone furoate-vilanterol (BREO ELLIPTA) 100-25 MCG/INH AEPB, Inhale 1 puff into the lungs daily., Disp: 30 each, Rfl: 5 ibuprofen (ADVIL,MOTRIN) 800 MG tablet, Take 800 mg by mouth every 8 (eight) hours as needed for moderate pain. , Disp: , Rfl:  linaclotide (LINZESS) 145 MCG CAPS capsule, Take 1 capsule (145 mcg total) by mouth daily before breakfast., Disp: 30 capsule, Rfl: 5 meclizine (ANTIVERT) 12.5 MG tablet, Take 12.5-25 mg by  mouth every 6 (six) hours as needed for dizziness. , Disp: , Rfl:  Omega-3 1000 MG CAPS, Take 1,000 mg by mouth 2 (two) times daily with a meal. , Disp: , Rfl:  ondansetron (ZOFRAN) 4 MG tablet, Take 1 tablet (4 mg total) by mouth every 8 (eight) hours as needed for nausea or vomiting., Disp: 12 tablet, Rfl: 0 oxyCODONE-acetaminophen (PERCOCET/ROXICET) 5-325 MG tablet, Take 1 tablet by mouth 2 (two) times daily as needed for moderate pain or severe pain. , Disp: , Rfl:  Polyethyl Glycol-Propyl Glycol (SYSTANE OP), Place 1 drop into both eyes daily., Disp: , Rfl:  RESTASIS 0.05 % ophthalmic emulsion, Place 1 drop into both eyes 2 (two) times daily., Disp: , Rfl:  rosuvastatin (CRESTOR) 5 MG tablet, Take 5 mg by mouth 3 (three) times a week., Disp: , Rfl:  SYNTHROID 112 MCG tablet, Take 112 mcg by mouth daily., Disp: , Rfl:   No current facility-administered medications for this visit.    Blood pressure 111/75, pulse 71, height 5\' 3"  (1.6 m), weight 176 lb (79.8 kg).  Physical Exam: No SSI noted Sutures removed  Diagnostic Tests:   Pathology: Seborrheic keratosis  Impression: 1. Encounter for removal of sutures   2. Seborrheic keratoses, inflamed, right vulva      Plan: No follow up needed    Follow up: prn   , MD

## 2020-09-23 ENCOUNTER — Encounter (INDEPENDENT_AMBULATORY_CARE_PROVIDER_SITE_OTHER): Payer: Self-pay

## 2020-09-23 ENCOUNTER — Encounter: Payer: Self-pay | Admitting: Obstetrics & Gynecology

## 2020-09-23 ENCOUNTER — Other Ambulatory Visit: Payer: Self-pay

## 2020-09-23 ENCOUNTER — Ambulatory Visit (INDEPENDENT_AMBULATORY_CARE_PROVIDER_SITE_OTHER): Payer: Medicare Other | Admitting: Obstetrics & Gynecology

## 2020-09-23 VITALS — BP 117/80 | HR 70 | Temp 98.4°F | Ht 63.0 in | Wt 177.0 lb

## 2020-09-23 DIAGNOSIS — B373 Candidiasis of vulva and vagina: Secondary | ICD-10-CM

## 2020-09-23 DIAGNOSIS — R350 Frequency of micturition: Secondary | ICD-10-CM

## 2020-09-23 DIAGNOSIS — R300 Dysuria: Secondary | ICD-10-CM | POA: Diagnosis not present

## 2020-09-23 DIAGNOSIS — B3731 Acute candidiasis of vulva and vagina: Secondary | ICD-10-CM

## 2020-09-23 LAB — POCT URINALYSIS DIPSTICK
Glucose, UA: NEGATIVE
Ketones, UA: NEGATIVE
Leukocytes, UA: NEGATIVE
Nitrite, UA: NEGATIVE
Protein, UA: NEGATIVE

## 2020-09-23 MED ORDER — FLUCONAZOLE 100 MG PO TABS
100.0000 mg | ORAL_TABLET | Freq: Every day | ORAL | 0 refills | Status: DC
Start: 2020-09-23 — End: 2020-12-12

## 2020-09-23 NOTE — Progress Notes (Signed)
Chief Complaint  Patient presents with   burning sensation around incision    Fever over the weekend      65 y.o. G3P0030 No LMP recorded. Patient is postmenopausal. The current method of family planning is post menopausal status.  Outpatient Encounter Medications as of 09/23/2020  Medication Sig Note   acidophilus (RISAQUAD) CAPS capsule Take 1 capsule by mouth daily.    albuterol (PROVENTIL) (2.5 MG/3ML) 0.083% nebulizer solution Take 3 mLs (2.5 mg total) by nebulization every 6 (six) hours as needed for wheezing or shortness of breath.    Ascorbic Acid (VITAMIN C) 500 MG CHEW Chew by mouth daily.     atenolol (TENORMIN) 50 MG tablet Take 50 mg by mouth 2 (two) times daily.     B-Complex TABS Take by mouth daily.     calcium carbonate (TUMS - DOSED IN MG ELEMENTAL CALCIUM) 500 MG chewable tablet Chew 1 tablet by mouth daily.    Cholecalciferol (VITAMIN D3 GUMMIES) 25 MCG (1000 UT) CHEW Chew 6,000 Units by mouth daily.    clonazePAM (KLONOPIN) 0.5 MG tablet Take 0.25-0.5 mg by mouth 2 (two) times daily as needed for anxiety.  06/10/2020: 1/2 dose   cyclobenzaprine (FLEXERIL) 10 MG tablet Take 10 mg by mouth 3 (three) times daily as needed for muscle spasms.     ELDERBERRY PO Take 1 capsule by mouth daily.    fluconazole (DIFLUCAN) 100 MG tablet Take 1 tablet (100 mg total) by mouth daily.    fluorometholone (FML) 0.1 % ophthalmic suspension Place 1 drop into both eyes in the morning and at bedtime.    fluticasone furoate-vilanterol (BREO ELLIPTA) 100-25 MCG/INH AEPB Inhale 1 puff into the lungs daily.    ibuprofen (ADVIL,MOTRIN) 800 MG tablet Take 800 mg by mouth every 8 (eight) hours as needed for moderate pain.     linaclotide (LINZESS) 145 MCG CAPS capsule Take 1 capsule (145 mcg total) by mouth daily before breakfast.    meclizine (ANTIVERT) 12.5 MG tablet Take 12.5-25 mg by mouth every 6 (six) hours as needed for dizziness.     Omega-3 1000 MG CAPS Take 1,000 mg by mouth 2  (two) times daily with a meal.     ondansetron (ZOFRAN) 4 MG tablet Take 1 tablet (4 mg total) by mouth every 8 (eight) hours as needed for nausea or vomiting.    oxyCODONE-acetaminophen (PERCOCET/ROXICET) 5-325 MG tablet Take 1 tablet by mouth 2 (two) times daily as needed for moderate pain or severe pain.     Polyethyl Glycol-Propyl Glycol (SYSTANE OP) Place 1 drop into both eyes daily.    RESTASIS 0.05 % ophthalmic emulsion Place 1 drop into both eyes 2 (two) times daily.    rosuvastatin (CRESTOR) 5 MG tablet Take 5 mg by mouth 3 (three) times a week.    SYNTHROID 112 MCG tablet Take 112 mcg by mouth daily.    No facility-administered encounter medications on file as of 09/23/2020.    Subjective Pt began having severe itching of her bottom Friday night, worsening into Saturday No antibiotics have been taken Her vulvar excision was normal last week Past Medical History:  Diagnosis Date   Abnormal glandular Papanicolaou smear of cervix    Anxiety    Asthma    Body mass index 30.0-30.9, adult    Celiac disease    Chronic vascular disorder of intestine (HCC)    Fibromyalgia    Graves disease    Headache    Migrains  Hyperlipidemia    Hypertension    Hypothyroidism    IBS (irritable bowel syndrome) With constipation    Lupus (systemic lupus erythematosus) (HCC)    Obesity, unspecified    Osteoporosis    Raynaud disease    Sjogren's disease (HCC)     Past Surgical History:  Procedure Laterality Date   APPENDECTOMY     CHOLECYSTECTOMY     COLONOSCOPY WITH PROPOFOL N/A 06/10/2020   Procedure: COLONOSCOPY WITH PROPOFOL;  Surgeon: Lanelle Balarver, Charles K, DO;  Location: [facility];  Service: Endoscopy;  Laterality: N/A;  am appt   OTHER SURGICAL HISTORY     biopsies on both feet   REDUCTION MAMMAPLASTY      OB History     Gravida  3   Para      Term      Preterm      AB  3   Living  0      SAB  3   IAB      Ectopic      Multiple      Live Births               Allergies  Allergen Reactions   Bactrim [Sulfamethoxazole-Trimethoprim] Nausea And Vomiting   Doxycycline     Made body feel crazy, increased heart rate   Gluten Meal     Ceiliac disease    Keflex [Cephalexin]    Latex Hives   Levaquin [Levofloxacin] Nausea And Vomiting   Sulfa Antibiotics Nausea And Vomiting   Contrast Media [Iodinated Diagnostic Agents] Nausea And Vomiting and Rash    Social History   Socioeconomic History   Marital status: Married    Spouse name: Not on file   Number of children: Not on file   Years of education: Not on file   Highest education level: Not on file  Occupational History   Not on file  Tobacco Use   Smoking status: Never   Smokeless tobacco: Never  Vaping Use   Vaping Use: Never used  Substance and Sexual Activity   Alcohol use: Yes    Comment: wine occ   Drug use: Never   Sexual activity: Not Currently    Birth control/protection: Post-menopausal  Other Topics Concern   Not on file  Social History Narrative   Not on file   Social Determinants of Health   Financial Resource Strain: Low Risk    Difficulty of Paying Living Expenses: Not hard at all  Food Insecurity: No Food Insecurity   Worried About Programme researcher, broadcasting/film/videounning Out of Food in the Last Year: Never true   Ran Out of Food in the Last Year: Never true  Transportation Needs: No Transportation Needs   Lack of Transportation (Medical): No   Lack of Transportation (Non-Medical): No  Physical Activity: Insufficiently Active   Days of Exercise per Week: 2 days   Minutes of Exercise per Session: 10 min  Stress: Stress Concern Present   Feeling of Stress : To some extent  Social Connections: Moderately Integrated   Frequency of Communication with Friends and Family: Three times a week   Frequency of Social Gatherings with Friends and Family: Once a week   Attends Religious Services: More than 4 times per year   Active Member of [employer] or Organizations: No   Attends Tax inspectorClub or  Organization Meetings: Never   Marital Status: Married    Family History  Problem Relation Age of Onset   Congestive Heart Failure Mother  Aneurysm Father    Celiac disease Brother    Melanoma Brother    Aneurysm Brother        stomach   Stroke Sister    Celiac disease Sister    Irritable bowel syndrome Sister    Celiac disease Brother    Aneurysm Sister    Other Sister        multiple back surgeries   Colon cancer Neg Hx     Medications:       Current Outpatient Medications:    acidophilus (RISAQUAD) CAPS capsule, Take 1 capsule by mouth daily., Disp: , Rfl:    albuterol (PROVENTIL) (2.5 MG/3ML) 0.083% nebulizer solution, Take 3 mLs (2.5 mg total) by nebulization every 6 (six) hours as needed for wheezing or shortness of breath., Disp: 75 mL, Rfl: 12   Ascorbic Acid (VITAMIN C) 500 MG CHEW, Chew by mouth daily. , Disp: , Rfl:    atenolol (TENORMIN) 50 MG tablet, Take 50 mg by mouth 2 (two) times daily. , Disp: , Rfl:    B-Complex TABS, Take by mouth daily. , Disp: , Rfl:    calcium carbonate (TUMS - DOSED IN MG ELEMENTAL CALCIUM) 500 MG chewable tablet, Chew 1 tablet by mouth daily., Disp: , Rfl:    Cholecalciferol (VITAMIN D3 GUMMIES) 25 MCG (1000 UT) CHEW, Chew 6,000 Units by mouth daily., Disp: , Rfl:    clonazePAM (KLONOPIN) 0.5 MG tablet, Take 0.25-0.5 mg by mouth 2 (two) times daily as needed for anxiety. , Disp: , Rfl:    cyclobenzaprine (FLEXERIL) 10 MG tablet, Take 10 mg by mouth 3 (three) times daily as needed for muscle spasms. , Disp: , Rfl:    ELDERBERRY PO, Take 1 capsule by mouth daily., Disp: , Rfl:    fluconazole (DIFLUCAN) 100 MG tablet, Take 1 tablet (100 mg total) by mouth daily., Disp: 7 tablet, Rfl: 0   fluorometholone (FML) 0.1 % ophthalmic suspension, Place 1 drop into both eyes in the morning and at bedtime., Disp: , Rfl:    fluticasone furoate-vilanterol (BREO ELLIPTA) 100-25 MCG/INH AEPB, Inhale 1 puff into the lungs daily., Disp: 30 each, Rfl: 5    ibuprofen (ADVIL,MOTRIN) 800 MG tablet, Take 800 mg by mouth every 8 (eight) hours as needed for moderate pain. , Disp: , Rfl:    linaclotide (LINZESS) 145 MCG CAPS capsule, Take 1 capsule (145 mcg total) by mouth daily before breakfast., Disp: 30 capsule, Rfl: 5   meclizine (ANTIVERT) 12.5 MG tablet, Take 12.5-25 mg by mouth every 6 (six) hours as needed for dizziness. , Disp: , Rfl:    Omega-3 1000 MG CAPS, Take 1,000 mg by mouth 2 (two) times daily with a meal. , Disp: , Rfl:    ondansetron (ZOFRAN) 4 MG tablet, Take 1 tablet (4 mg total) by mouth every 8 (eight) hours as needed for nausea or vomiting., Disp: 12 tablet, Rfl: 0   oxyCODONE-acetaminophen (PERCOCET/ROXICET) 5-325 MG tablet, Take 1 tablet by mouth 2 (two) times daily as needed for moderate pain or severe pain. , Disp: , Rfl:    Polyethyl Glycol-Propyl Glycol (SYSTANE OP), Place 1 drop into both eyes daily., Disp: , Rfl:    RESTASIS 0.05 % ophthalmic emulsion, Place 1 drop into both eyes 2 (two) times daily., Disp: , Rfl:    rosuvastatin (CRESTOR) 5 MG tablet, Take 5 mg by mouth 3 (three) times a week., Disp: , Rfl:    SYNTHROID 112 MCG tablet, Take 112 mcg by mouth daily., Disp: ,  Rfl:   Objective Blood pressure 117/80, pulse 70, temperature 98.4 F (36.9 C), height 5\' 3"  (1.6 m), weight 177 lb (80.3 kg).  Severe candidal vulvaginitis of the lower abdomen vulva inner thighs vagina  Pertinent ROS No burning with urination, frequency or urgency No nausea, vomiting or diarrhea Nor fever chills or other constitutional symptoms   Labs or studies Pending A1C    Impression Diagnoses this Encounter::   ICD-10-CM   1. Candidal vulvovaginitis  B37.3 Hemoglobin A1C    2. Burning with urination  R30.0 POCT Urinalysis Dipstick    3. Urinary frequency  R35.0 POCT Urinalysis Dipstick      Established relevant diagnosis(es):   Plan/Recommendations: Meds ordered this encounter  Medications   fluconazole (DIFLUCAN) 100 MG  tablet    Sig: Take 1 tablet (100 mg total) by mouth daily.    Dispense:  7 tablet    Refill:  0    Labs or Scans Ordered: Orders Placed This Encounter  Procedures   Hemoglobin A1C   POCT Urinalysis Dipstick    Management:: Treated with gentian violet Rx for diflucan 100 mg every other night  Follow up Return if symptoms worsen or fail to improve.       All questions were answered.

## 2020-10-04 ENCOUNTER — Ambulatory Visit
Admission: EM | Admit: 2020-10-04 | Discharge: 2020-10-04 | Disposition: A | Discharge status: Home / Self Care | Payer: Medicare Other | Attending: Emergency Medicine | Admitting: Emergency Medicine

## 2020-10-04 ENCOUNTER — Other Ambulatory Visit: Payer: Self-pay

## 2020-10-04 ENCOUNTER — Encounter: Payer: Self-pay | Admitting: Emergency Medicine

## 2020-10-04 DIAGNOSIS — L301 Dyshidrosis [pompholyx]: Secondary | ICD-10-CM

## 2020-10-04 DIAGNOSIS — L089 Local infection of the skin and subcutaneous tissue, unspecified: Secondary | ICD-10-CM

## 2020-10-04 MED ORDER — FLUOCINONIDE 0.05 % EX OINT: 1.0000 "application " | TOPICAL_OINTMENT | Freq: Two times a day (BID) | CUTANEOUS | 0 refills | Status: DC

## 2020-10-04 MED ORDER — CLINDAMYCIN HCL 300 MG PO CAPS: 300.0000 mg | ORAL_CAPSULE | Freq: Three times a day (TID) | ORAL | 0 refills | Status: AC

## 2020-10-04 NOTE — ED Provider Notes (Signed)
[facility]   [id number] 10/04/20 Arrival Time: 1035  CC: Rash  SUBJECTIVE:  Katie Davis is a 65 y.o. female who presents with a rash to RT hand x 1 week, with worsening over the past few days.  Admits to changes in detergent, but otherwise, no changes in body wash, lotions, or moisturizers.  Localizes the rash to thumb, middle and ring finger.  Describes it as itchy, dry/ peeling, and red blister.  Has tried medicated Band-Aid without relief.  Symptoms are made worse with itching.  Reports similar symptoms in the past with finger infection.   Denies fever, chills, nausea, vomiting, drainage.    ROS: As per HPI.  All other pertinent ROS negative.     Past Medical History:  Diagnosis Date   Abnormal glandular Papanicolaou smear of cervix    Anxiety    Asthma    Body mass index 30.0-30.9, adult    Celiac disease    Chronic vascular disorder of intestine (HCC)    Fibromyalgia    Graves disease    Headache    Migrains   Hyperlipidemia    Hypertension    Hypothyroidism    IBS (irritable bowel syndrome) With constipation    Lupus (systemic lupus erythematosus) (HCC)    Obesity, unspecified    Osteoporosis    Raynaud disease    Sjogren's disease (HCC)    Past Surgical History:  Procedure Laterality Date   APPENDECTOMY     CHOLECYSTECTOMY     COLONOSCOPY WITH PROPOFOL N/A 06/10/2020   Procedure: COLONOSCOPY WITH PROPOFOL;  Surgeon: Lanelle Bal, DO;  Location: [facility];  Service: Endoscopy;  Laterality: N/A;  am appt   OTHER SURGICAL HISTORY     biopsies on both feet   REDUCTION MAMMAPLASTY     Allergies  Allergen Reactions   Bactrim [Sulfamethoxazole-Trimethoprim] Nausea And Vomiting   Doxycycline     Made body feel crazy, increased heart rate   Gluten Meal     Ceiliac disease    Keflex [Cephalexin]    Latex Hives   Levaquin [Levofloxacin] Nausea And Vomiting   Sulfa Antibiotics Nausea And Vomiting   Contrast Media [Iodinated Diagnostic Agents]  Nausea And Vomiting and Rash   No current facility-administered medications on file prior to encounter.   Current Outpatient Medications on File Prior to Encounter  Medication Sig Dispense Refill   acidophilus (RISAQUAD) CAPS capsule Take 1 capsule by mouth daily.     albuterol (PROVENTIL) (2.5 MG/3ML) 0.083% nebulizer solution Take 3 mLs (2.5 mg total) by nebulization every 6 (six) hours as needed for wheezing or shortness of breath. 75 mL 12   Ascorbic Acid (VITAMIN C) 500 MG CHEW Chew by mouth daily.      atenolol (TENORMIN) 50 MG tablet Take 50 mg by mouth 2 (two) times daily.      B-Complex TABS Take by mouth daily.      calcium carbonate (TUMS - DOSED IN MG ELEMENTAL CALCIUM) 500 MG chewable tablet Chew 1 tablet by mouth daily.     Cholecalciferol (VITAMIN D3 GUMMIES) 25 MCG (1000 UT) CHEW Chew 6,000 Units by mouth daily.     clonazePAM (KLONOPIN) 0.5 MG tablet Take 0.25-0.5 mg by mouth 2 (two) times daily as needed for anxiety.      cyclobenzaprine (FLEXERIL) 10 MG tablet Take 10 mg by mouth 3 (three) times daily as needed for muscle spasms.      ELDERBERRY PO Take 1 capsule by mouth daily.  fluconazole (DIFLUCAN) 100 MG tablet Take 1 tablet (100 mg total) by mouth daily. 7 tablet 0   fluorometholone (FML) 0.1 % ophthalmic suspension Place 1 drop into both eyes in the morning and at bedtime.     fluticasone furoate-vilanterol (BREO ELLIPTA) 100-25 MCG/INH AEPB Inhale 1 puff into the lungs daily. 30 each 5   ibuprofen (ADVIL,MOTRIN) 800 MG tablet Take 800 mg by mouth every 8 (eight) hours as needed for moderate pain.      linaclotide (LINZESS) 145 MCG CAPS capsule Take 1 capsule (145 mcg total) by mouth daily before breakfast. 30 capsule 5   meclizine (ANTIVERT) 12.5 MG tablet Take 12.5-25 mg by mouth every 6 (six) hours as needed for dizziness.      Omega-3 1000 MG CAPS Take 1,000 mg by mouth 2 (two) times daily with a meal.      ondansetron (ZOFRAN) 4 MG tablet Take 1 tablet (4 mg  total) by mouth every 8 (eight) hours as needed for nausea or vomiting. 12 tablet 0   oxyCODONE-acetaminophen (PERCOCET/ROXICET) 5-325 MG tablet Take 1 tablet by mouth 2 (two) times daily as needed for moderate pain or severe pain.      Polyethyl Glycol-Propyl Glycol (SYSTANE OP) Place 1 drop into both eyes daily.     RESTASIS 0.05 % ophthalmic emulsion Place 1 drop into both eyes 2 (two) times daily.     rosuvastatin (CRESTOR) 5 MG tablet Take 5 mg by mouth 3 (three) times a week.     SYNTHROID 112 MCG tablet Take 112 mcg by mouth daily.     Social History   Socioeconomic History   Marital status: Married    Spouse name: Not on file   Number of children: Not on file   Years of education: Not on file   Highest education level: Not on file  Occupational History   Not on file  Tobacco Use   Smoking status: Never   Smokeless tobacco: Never  Vaping Use   Vaping Use: Never used  Substance and Sexual Activity   Alcohol use: Yes    Comment: wine occ   Drug use: Never   Sexual activity: Not Currently    Birth control/protection: Post-menopausal  Other Topics Concern   Not on file  Social History Narrative   Not on file   Social Determinants of Health   Financial Resource Strain: Low Risk    Difficulty of Paying Living Expenses: Not hard at all  Food Insecurity: No Food Insecurity   Worried About Programme researcher, broadcasting/film/video in the Last Year: Never true   Ran Out of Food in the Last Year: Never true  Transportation Needs: No Transportation Needs   Lack of Transportation (Medical): No   Lack of Transportation (Non-Medical): No  Physical Activity: Insufficiently Active   Days of Exercise per Week: 2 days   Minutes of Exercise per Session: 10 min  Stress: Stress Concern Present   Feeling of Stress : To some extent  Social Connections: Moderately Integrated   Frequency of Communication with Friends and Family: Three times a week   Frequency of Social Gatherings with Friends and Family:  Once a week   Attends Religious Services: More than 4 times per year   Active Member of [employer] or Organizations: No   Attends Banker Meetings: Never   Marital Status: Married  Catering manager Violence: Not At Risk   Fear of Current or Ex-Partner: No   Emotionally Abused: No   Physically  Abused: No   Sexually Abused: No   Family History  Problem Relation Age of Onset   Congestive Heart Failure Mother    Aneurysm Father    Celiac disease Brother    Melanoma Brother    Aneurysm Brother        stomach   Stroke Sister    Celiac disease Sister    Irritable bowel syndrome Sister    Celiac disease Brother    Aneurysm Sister    Other Sister        multiple back surgeries   Colon cancer Neg Hx     OBJECTIVE: Vitals:   10/04/20 1125  BP: 115/75  Pulse: 66  Resp: 14  Temp: (!) 97.4 F (36.3 C)  TempSrc: Tympanic  SpO2: 95%    General appearance: alert; no distress Head: NCAT Lungs: clear to auscultation bilaterally Heart: radial pulse 2+ Extremities: no edema Skin: warm and dry; peeling/ dry skin to medial aspect of first distal digit and lateral distal aspect of fourth digit, possible papular formation to third digit; erythema/ abrasion apx 1 cm in diameter to base of nail of third digit without noticeable drainage or bleeding or paronychia, NTTP Psychological: alert and cooperative; normal mood and affect  ASSESSMENT & PLAN:  1. Dyshidrotic eczema   2. Skin infection     Meds ordered this encounter  Medications   fluocinonide ointment (LIDEX) 0.05 %    Sig: Apply 1 application topically 2 (two) times daily.    Dispense:  30 g    Refill:  0    Order Specific Question:   Supervising Provider    Answer:   Eustace Moore [[id number]]   clindamycin (CLEOCIN) 300 MG capsule    Sig: Take 1 capsule (300 mg total) by mouth 3 (three) times daily for 10 days.    Dispense:  30 capsule    Refill:  0    Order Specific Question:   Supervising Provider     Answer:   Eustace Moore [[id number]]    @[facility] @  Wash with warm water and mild soap Avoid hot showers or baths Moisturize skin daily Use lidex or fluocinonide ointment on hands.  Apply at night and wear thin cotton gloves to help absorb the ointment over night Use OTC zyrtec, claritin, or allegra during the day Clindamycin prescribed to cover for infection Please follow up with dermatologist for further evaluation and management Return or go to the ED if you have any new or worsen symptoms such as fever, chills, nausea, vomiting, redness, swelling, discharge, oral lesions, shortness of breath, chest pain, abdominal pain, changes in bowel or bladder function, etc...   Reviewed expectations re: course of current medical issues. Questions answered. Outlined signs and symptoms indicating need for more acute intervention. Patient verbalized understanding. After Visit Summary given.    , PA-C 10/04/20 1242

## 2020-10-04 NOTE — Discharge Instructions (Addendum)
Wash with warm water and mild soap Avoid hot showers or baths Moisturize skin daily Use lidex or fluocinonide ointment on hands.  Apply at night and wear thin cotton gloves to help absorb the ointment over night Use OTC zyrtec, claritin, or allegra during the day Clindamycin prescribed to cover for infection Please follow up with dermatologist for further evaluation and management Return or go to the ED if you have any new or worsen symptoms such as fever, chills, nausea, vomiting, redness, swelling, discharge, oral lesions, shortness of breath, chest pain, abdominal pain, changes in bowel or bladder function, etc..Marland Kitchen

## 2020-10-04 NOTE — ED Triage Notes (Signed)
Fingers on right hand are peeling and blistering since Tuesday.    States area itches

## 2020-10-16 ENCOUNTER — Ambulatory Visit: Payer: Medicare Other | Admitting: Pulmonary Disease

## 2020-10-16 ENCOUNTER — Encounter: Payer: Self-pay | Admitting: Pulmonary Disease

## 2020-10-16 ENCOUNTER — Other Ambulatory Visit: Payer: Self-pay

## 2020-10-16 VITALS — BP 118/82 | HR 67 | Temp 98.2°F | Ht 63.0 in | Wt 177.0 lb

## 2020-10-16 DIAGNOSIS — J4550 Severe persistent asthma, uncomplicated: Secondary | ICD-10-CM

## 2020-10-16 MED ORDER — FLUTICASONE FUROATE-VILANTEROL 100-25 MCG/INH IN AEPB: 1.0000 | INHALATION_SPRAY | Freq: Every day | RESPIRATORY_TRACT | 5 refills | Status: DC

## 2020-10-16 NOTE — Patient Instructions (Signed)
Follow up in 4 months 

## 2020-10-16 NOTE — Progress Notes (Signed)
[facility]  Chief Complaint  Patient presents with   Follow-up    Patient states that she has stopped the nebulizer due to it making her heart race and shaky. States she is doing very well with the Breo.     Constitutional:  BP 118/82 (BP Location: Left Arm, Patient Position: Sitting, Cuff Size: Normal)   Pulse 67   Temp 98.2 F (36.8 C) (Oral)   Ht 5\' 3"  (1.6 m)   Wt 177 lb (80.3 kg)   SpO2 99%   BMI 31.35 kg/m   Past Medical History:  Anxiety, Celiac disease, Fibromyalgia, Grave's disease, HLD, Migraine headaches, HTN, IBS, Lupus, Osteoporosis, Raynaud disease, Sjogren's disease  Past Surgical History:  She  has a past surgical history that includes Reduction mammaplasty; Cholecystectomy; Appendectomy; OTHER SURGICAL HISTORY; and Colonoscopy with propofol (N/A, 06/10/2020).  Brief Summary:  Katie Davis is a 65 y.o. female with asthma.      Subjective:   She feels that breo has helped tremendously.  She feels like she is about 80% back to normal.  She is able to take deeper breaths.  Still has cough in the morning but easier to bring up sputum.  Albuterol nebulizer caused her to get jitters.  She is planning to start a gradual exercise regimen and has been doing yoga breathing.  Physical Exam:   Appearance - well kempt   ENMT - no sinus tenderness, no oral exudate, no LAN, Mallampati 3 airway, no stridor  Respiratory - equal breath sounds bilaterally, no wheezing or rales  CV - s1s2 regular rate and rhythm, no murmurs  Ext - no clubbing, no edema  Skin - no rashes  Psych - normal mood and affect    Pulmonary testing:    Chest Imaging:    Social History:  She  reports that she has never smoked. She has never used smokeless tobacco. She reports current alcohol use. She reports that she does not use drugs.  Family History:  Her family history includes Aneurysm in her brother, father, and sister; Celiac disease  in her brother, brother, and sister; Congestive Heart Failure in her mother; Irritable bowel syndrome in her sister; Melanoma in her brother; Other in her sister; Stroke in her sister.     Assessment/Plan:   Severe, persistent asthma. - continue breo - prn albuterol - she would like to see how much she improves with exercise before getting PFT set up  Time Spent Involved in Patient Care on Day of Examination:  23 minutes  Follow up:   Patient Instructions  Follow up in 4 months  Medication List:   Allergies as of 10/16/2020       Reactions   Bactrim [sulfamethoxazole-trimethoprim] Nausea And Vomiting   Doxycycline    Made body feel crazy, increased heart rate   Gluten Meal    Ceiliac disease   Keflex [cephalexin]    Latex Hives   Levaquin [levofloxacin] Nausea And Vomiting   Sulfa Antibiotics Nausea And Vomiting   Contrast Media [iodinated Diagnostic Agents] Nausea And Vomiting, Rash        Medication List        Accurate as of October 16, 2020  9:59 AM. If you have any questions, ask your nurse or doctor.          acidophilus Caps capsule Take 1 capsule by mouth daily.   albuterol 108 (90 Base) MCG/ACT inhaler Commonly known as: VENTOLIN HFA Inhale 1-2 puffs into  the lungs every 6 (six) hours as needed for wheezing or shortness of breath. What changed: Another medication with the same name was removed. Continue taking this medication, and follow the directions you see here. Changed by: Coralyn Helling, MD   atenolol 50 MG tablet Commonly known as: TENORMIN Take 50 mg by mouth 2 (two) times daily.   B-Complex Tabs Take by mouth daily.   calcium carbonate 500 MG chewable tablet Commonly known as: TUMS - dosed in mg elemental calcium Chew 1 tablet by mouth daily.   clonazePAM 0.5 MG tablet Commonly known as: KLONOPIN Take 0.25-0.5 mg by mouth 2 (two) times daily as needed for anxiety.   cyclobenzaprine 10 MG tablet Commonly known as: FLEXERIL Take 10 mg  by mouth 3 (three) times daily as needed for muscle spasms.   ELDERBERRY PO Take 1 capsule by mouth daily.   fluconazole 100 MG tablet Commonly known as: DIFLUCAN Take 1 tablet (100 mg total) by mouth daily.   fluocinonide ointment 0.05 % Commonly known as: LIDEX Apply 1 application topically 2 (two) times daily.   fluorometholone 0.1 % ophthalmic suspension Commonly known as: FML Place 1 drop into both eyes in the morning and at bedtime.   fluticasone furoate-vilanterol 100-25 MCG/INH Aepb Commonly known as: Breo Ellipta Inhale 1 puff into the lungs daily.   ibuprofen 800 MG tablet Commonly known as: ADVIL Take 800 mg by mouth every 8 (eight) hours as needed for moderate pain.   linaclotide 145 MCG Caps capsule Commonly known as: Linzess Take 1 capsule (145 mcg total) by mouth daily before breakfast.   meclizine 12.5 MG tablet Commonly known as: ANTIVERT Take 12.5-25 mg by mouth every 6 (six) hours as needed for dizziness.   Omega-3 1000 MG Caps Take 1,000 mg by mouth 2 (two) times daily with a meal.   ondansetron 4 MG tablet Commonly known as: Zofran Take 1 tablet (4 mg total) by mouth every 8 (eight) hours as needed for nausea or vomiting.   oxyCODONE-acetaminophen 5-325 MG tablet Commonly known as: PERCOCET/ROXICET Take 1 tablet by mouth 2 (two) times daily as needed for moderate pain or severe pain.   Restasis 0.05 % ophthalmic emulsion Generic drug: cycloSPORINE Place 1 drop into both eyes 2 (two) times daily.   rosuvastatin 5 MG tablet Commonly known as: CRESTOR Take 5 mg by mouth 3 (three) times a week.   Synthroid 112 MCG tablet Generic drug: levothyroxine Take 112 mcg by mouth daily.   SYSTANE OP Place 1 drop into both eyes daily.   Vitamin C 500 MG Chew Chew by mouth daily.   Vitamin D3 Gummies 25 MCG (1000 UT) Chew Generic drug: Cholecalciferol Chew 6,000 Units by mouth daily.        Signature:  Coralyn Helling, MD [facility]  Pulmonary/Critical Care Pager - [phone] 10/16/2020, 9:59 AM

## 2020-10-28 DIAGNOSIS — D225 Melanocytic nevi of trunk: Secondary | ICD-10-CM | POA: Diagnosis not present

## 2020-10-28 DIAGNOSIS — L570 Actinic keratosis: Secondary | ICD-10-CM | POA: Diagnosis not present

## 2020-10-28 DIAGNOSIS — L658 Other specified nonscarring hair loss: Secondary | ICD-10-CM | POA: Diagnosis not present

## 2020-10-28 DIAGNOSIS — Z1283 Encounter for screening for malignant neoplasm of skin: Secondary | ICD-10-CM | POA: Diagnosis not present

## 2020-10-29 ENCOUNTER — Ambulatory Visit
Admission: EM | Admit: 2020-10-29 | Discharge: 2020-10-29 | Disposition: A | Discharge status: Home / Self Care | Payer: Medicare Other | Attending: Family Medicine | Admitting: Family Medicine

## 2020-10-29 ENCOUNTER — Other Ambulatory Visit: Payer: Self-pay

## 2020-10-29 ENCOUNTER — Ambulatory Visit (INDEPENDENT_AMBULATORY_CARE_PROVIDER_SITE_OTHER): Payer: Medicare Other

## 2020-10-29 DIAGNOSIS — S6991XA Unspecified injury of right wrist, hand and finger(s), initial encounter: Secondary | ICD-10-CM | POA: Diagnosis not present

## 2020-10-29 DIAGNOSIS — M25531 Pain in right wrist: Secondary | ICD-10-CM

## 2020-10-29 MED ORDER — DICLOFENAC SODIUM 50 MG PO TBEC: 50.0000 mg | DELAYED_RELEASE_TABLET | Freq: Two times a day (BID) | ORAL | 0 refills | Status: DC

## 2020-10-29 NOTE — ED Provider Notes (Signed)
[facility]    CSN: [id number] Arrival date & time: 10/29/20  0807      History   Chief Complaint Chief Complaint  Patient presents with   Wrist Injury    HPI Katie Davis is a 65 y.o. female.   HPI Patient presents today with right wrist injury.  Patient reports while at home last night barreled fell on her right forearm and rolled onto her wrist.  Report immediately experiencing pain.  She did immediately applied ice and took 800 mg of ibuprofen without relief.  She reports today she is having pain with any right thumb movement and is unable to rotate or flex her wrist.  She also took another dose of ibuprofen earlier today.  Endorses paresthesias involving the right thumb only.  Patient has a history of osteopenia and arthritis involving the right wrist.  Past Medical History:  Diagnosis Date   Abnormal glandular Papanicolaou smear of cervix    Anxiety    Asthma    Body mass index 30.0-30.9, adult    Celiac disease    Chronic vascular disorder of intestine (HCC)    Fibromyalgia    Graves disease    Headache    Migrains   Hyperlipidemia    Hypertension    Hypothyroidism    IBS (irritable bowel syndrome) With constipation    Lupus (systemic lupus erythematosus) (HCC)    Obesity, unspecified    Osteoporosis    Raynaud disease    Sjogren's disease (HCC)     Patient Active Problem List   Diagnosis Date Noted   Constipation 05/07/2020   Preventative health care 05/07/2020   Nausea without vomiting 05/07/2020   Abdominal pain 10/24/2019   Postmenopausal 12/14/2018   Vasomotor symptoms due to menopause 12/14/2018   History of abnormal cervical Pap smear 12/14/2018    Past Surgical History:  Procedure Laterality Date   APPENDECTOMY     CHOLECYSTECTOMY     COLONOSCOPY WITH PROPOFOL N/A 06/10/2020   Procedure: COLONOSCOPY WITH PROPOFOL;  Surgeon: Lanelle Balarver, Charles K, DO;  Location: [facility];  Service: Endoscopy;  Laterality: N/A;  am appt   OTHER  SURGICAL HISTORY     biopsies on both feet   REDUCTION MAMMAPLASTY      OB History     Gravida  3   Para      Term      Preterm      AB  3   Living  0      SAB  3   IAB      Ectopic      Multiple      Live Births               Home Medications    Prior to Admission medications   Medication Sig Start Date End Date Taking? Authorizing Provider  diclofenac (VOLTAREN) 50 MG EC tablet Take 1 tablet (50 mg total) by mouth 2 (two) times daily. 10/29/20  Yes Bing NeighborsHarris, Kaja Jackowski S, FNP  acidophilus (RISAQUAD) CAPS capsule Take 1 capsule by mouth daily.    [provider]  albuterol (VENTOLIN HFA) 108 (90 Base) MCG/ACT inhaler Inhale 1-2 puffs into the lungs every 6 (six) hours as needed for wheezing or shortness of breath.    [provider]  Ascorbic Acid (VITAMIN C) 500 MG CHEW Chew by mouth daily.     [provider]  atenolol (TENORMIN) 50 MG tablet Take 50 mg by mouth 2 (two) times daily.  [provider]  B-Complex TABS Take by mouth daily.     [provider]  calcium carbonate (TUMS - DOSED IN MG ELEMENTAL CALCIUM) 500 MG chewable tablet Chew 1 tablet by mouth daily.    [provider]  Cholecalciferol (VITAMIN D3 GUMMIES) 25 MCG (1000 UT) CHEW Chew 6,000 Units by mouth daily.    [provider]  clonazePAM (KLONOPIN) 0.5 MG tablet Take 0.25-0.5 mg by mouth 2 (two) times daily as needed for anxiety.     [provider]  cyclobenzaprine (FLEXERIL) 10 MG tablet Take 10 mg by mouth 3 (three) times daily as needed for muscle spasms.  07/07/19   [provider]  ELDERBERRY PO Take 1 capsule by mouth daily.    [provider]  fluconazole (DIFLUCAN) 100 MG tablet Take 1 tablet (100 mg total) by mouth daily. 09/23/20   Lazaro Arms, MD  fluocinonide ointment (LIDEX) 0.05 % Apply 1 application topically 2 (two) times daily. 10/04/20   Wurst, [place], PA-C  fluorometholone (FML) 0.1 %  ophthalmic suspension Place 1 drop into both eyes in the morning and at bedtime. 10/25/19   [provider]  fluticasone furoate-vilanterol (BREO ELLIPTA) 100-25 MCG/INH AEPB Inhale 1 puff into the lungs daily. 10/16/20   Coralyn Helling, MD  ibuprofen (ADVIL,MOTRIN) 800 MG tablet Take 800 mg by mouth every 8 (eight) hours as needed for moderate pain.     [provider]  linaclotide Karlene Einstein) 145 MCG CAPS capsule Take 1 capsule (145 mcg total) by mouth daily before breakfast. 06/10/20 12/07/20  Lanelle Bal, DO  meclizine (ANTIVERT) 12.5 MG tablet Take 12.5-25 mg by mouth every 6 (six) hours as needed for dizziness.     Benita Stabile, MD  Omega-3 1000 MG CAPS Take 1,000 mg by mouth 2 (two) times daily with a meal.     [provider]  ondansetron (ZOFRAN) 4 MG tablet Take 1 tablet (4 mg total) by mouth every 8 (eight) hours as needed for nausea or vomiting. 08/25/20   Eber Hong, MD  oxyCODONE-acetaminophen (PERCOCET/ROXICET) 5-325 MG tablet Take 1 tablet by mouth 2 (two) times daily as needed for moderate pain or severe pain.     [provider]  Polyethyl Glycol-Propyl Glycol (SYSTANE OP) Place 1 drop into both eyes daily.    [provider]  RESTASIS 0.05 % ophthalmic emulsion Place 1 drop into both eyes 2 (two) times daily. 10/25/19   [provider]  rosuvastatin (CRESTOR) 5 MG tablet Take 5 mg by mouth 3 (three) times a week. 03/05/20   [provider]  SYNTHROID 112 MCG tablet Take 112 mcg by mouth daily. 04/19/20   [provider]    Family History Family History  Problem Relation Age of Onset   Congestive Heart Failure Mother    Aneurysm Father    Celiac disease Brother    Melanoma Brother    Aneurysm Brother        stomach   Stroke Sister    Celiac disease Sister    Irritable bowel syndrome Sister    Celiac disease Brother    Aneurysm Sister    Other Sister        multiple back surgeries   Colon cancer Neg Hx      Social History Social History   Tobacco Use   Smoking status: Never   Smokeless tobacco: Never  Vaping Use   Vaping Use: Never used  Substance Use Topics  Alcohol use: Yes    Comment: wine occ   Drug use: Never     Allergies   Bactrim [sulfamethoxazole-trimethoprim], Doxycycline, Gluten meal, Keflex [cephalexin], Latex, Levaquin [levofloxacin], Sulfa antibiotics, and Contrast media [iodinated diagnostic agents]   Review of Systems Review of Systems Pertinent negatives listed in HPI   Physical Exam Triage Vital Signs ED Triage Vitals  Enc Vitals Group     BP 10/29/20 0823 120/74     Pulse Rate 10/29/20 0823 67     Resp 10/29/20 0823 20     Temp 10/29/20 0823 98.5 F (36.9 C)     Temp src --      SpO2 10/29/20 0823 96 %     Weight --      Height --      Head Circumference --      Peak Flow --      Pain Score 10/29/20 0819 10     Pain Loc --      Pain Edu? --      Excl. in GC? --    No data found.  Updated Vital Signs BP 120/74   Pulse 67   Temp 98.5 F (36.9 C)   Resp 20   SpO2 96%   Visual Acuity Right Eye Distance:   Left Eye Distance:   Bilateral Distance:    Right Eye Near:   Left Eye Near:    Bilateral Near:     Physical Exam General appearance: Alert, well developed, well nourished, cooperative  Head: Normocephalic, without obvious abnormality, atraumatic Respiratory: Respirations even and unlabored, normal respiratory rate Heart: Rate and rhythm normal. No gallop or murmurs noted on exam  Extremities: Right wrist trace swelling lateral distal forearm mild ecchymosis and palpable tenderness Skin: Skin color, texture, turgor normal. No rashes seen  Psych: Appropriate mood and affect. Neurologic: GCS 15, normal coordination, normal gait  UC Treatments / Results  Labs (all labs ordered are listed, but only abnormal results are displayed) Labs Reviewed - No data to display  EKG   Radiology DG Wrist Complete Right  Result Date:  10/29/2020 CLINICAL DATA:  65 year old female with wrist injury and pain EXAM: RIGHT WRIST - COMPLETE 3+ VIEW COMPARISON:  None. FINDINGS: Osteopenia. No displaced fracture identified.  No radiopaque foreign body. Carpal bones aligned. No focal soft tissue swelling. Degenerative changes of the first carpometacarpal joint. Degenerative changes partially imaged in the hand. Surgical changes of the fifth digit. IMPRESSION: Negative for acute bony abnormality. Electronically Signed   By: Gilmer Mor D.O.   On: 10/29/2020 08:47    Procedures Procedures (including critical care time)  Medications Ordered in UC Medications - No data to display  Initial Impression / Assessment and Plan / UC Course  I have reviewed the triage vital signs and the nursing notes.  Pertinent labs & imaging results that were available during my care of the patient were reviewed by me and considered in my medical decision making (see chart for details).     Right wrist injury, plain film imaging is negative for any acute fracture only consistent with chronic changes which are known by patient.  Continue with RICE therapy.  Discontinue ibuprofen 800 start diclofenac 50 mg 3 times daily as needed for pain and swelling.  Patient is concerned as she has an paired range of motion however educated regarding that injury is less than 24 hours and tenderness with movement is an expected finding.  Encouraged to continue ice compresses every 4-6 hours rotating  fingers and advancing range of motion as tolerated.  Information given to follow-up with [facility] if symptoms worsen or do not improve. Final Clinical Impressions(s) / UC Diagnoses   Final diagnoses:  Right wrist injury, initial encounter   Discharge Instructions   None    ED Prescriptions     Medication Sig Dispense Auth. Provider   diclofenac (VOLTAREN) 50 MG EC tablet Take 1 tablet (50 mg total) by mouth 2 (two) times daily. 20 tablet Bing Neighbors, FNP       PDMP not reviewed this encounter.   Bing Neighbors, [place] 10/29/20 [phone]

## 2020-10-29 NOTE — ED Triage Notes (Signed)
Pt presents with right wrist injury from heavy object falling on it last night

## 2020-11-01 ENCOUNTER — Other Ambulatory Visit (HOSPITAL_COMMUNITY): Payer: Self-pay | Admitting: Women's Health

## 2020-11-01 DIAGNOSIS — Z1231 Encounter for screening mammogram for malignant neoplasm of breast: Secondary | ICD-10-CM

## 2020-11-01 DIAGNOSIS — I10 Essential (primary) hypertension: Secondary | ICD-10-CM | POA: Diagnosis not present

## 2020-11-03 DIAGNOSIS — M3500 Sicca syndrome, unspecified: Secondary | ICD-10-CM | POA: Insufficient documentation

## 2020-11-05 DIAGNOSIS — F411 Generalized anxiety disorder: Secondary | ICD-10-CM | POA: Diagnosis not present

## 2020-11-05 DIAGNOSIS — K900 Celiac disease: Secondary | ICD-10-CM | POA: Diagnosis not present

## 2020-11-05 DIAGNOSIS — I10 Essential (primary) hypertension: Secondary | ICD-10-CM | POA: Diagnosis not present

## 2020-11-05 DIAGNOSIS — M797 Fibromyalgia: Secondary | ICD-10-CM | POA: Diagnosis not present

## 2020-11-08 ENCOUNTER — Ambulatory Visit: Payer: Medicare Other | Admitting: Orthopedic Surgery

## 2020-11-08 ENCOUNTER — Other Ambulatory Visit: Payer: Self-pay

## 2020-11-08 ENCOUNTER — Ambulatory Visit: Payer: Medicare Other

## 2020-11-08 ENCOUNTER — Encounter: Payer: Self-pay | Admitting: Orthopedic Surgery

## 2020-11-08 VITALS — BP 132/79 | HR 69 | Ht 63.0 in | Wt 164.0 lb

## 2020-11-08 DIAGNOSIS — S52501A Unspecified fracture of the lower end of right radius, initial encounter for closed fracture: Secondary | ICD-10-CM | POA: Diagnosis not present

## 2020-11-08 DIAGNOSIS — M25531 Pain in right wrist: Secondary | ICD-10-CM

## 2020-11-08 MED ORDER — GABAPENTIN 100 MG PO CAPS: 100.0000 mg | ORAL_CAPSULE | Freq: Three times a day (TID) | ORAL | 0 refills | Status: DC

## 2020-11-08 NOTE — Patient Instructions (Signed)

## 2020-11-08 NOTE — Progress Notes (Signed)
New Patient Visit  Assessment: Katie Davis is a 65 y.o. female with the following: Mildly displaced right distal radius fracture; symptoms of carpal tunnel syndrome in the right wrist, improving  Plan: It is difficult to discern what has caused her injury and associated symptoms.  The mechanism of her injury is not necessarily consistent with all of her symptoms.  Nonetheless, there does appear to be a very small step-off, without obvious separation or displacement of the distal radius fracture at the joint.  Other than that, radiographs demonstrate first CMC arthritis.  She states that the numbness tingling and associated burning type pain in the median nerve distribution is improving, but still has some numbness in the right thumb.  She notes it is difficult to use her right hand as she was before.  After discussing all this with the patient, I think it is reasonable to immobilize the wrist for a couple of weeks, in hopes of improving the pain and swelling.  She is in agreement with this plan.  We will see her back in 2 weeks for repeat evaluation.  At that time, we will remove the cast and get updated x-rays.  We also provide her with a prescription for gabapentin.  Cast application - right short arm cast   Verbal consent was obtained and the correct extremity was identified. A well padded, appropriately molded short arm cast was applied to the right arm Fingers remained warm and well perfused.   There were no sharp edges Patient tolerated the procedure well Cast care instructions were provided    Follow-up: Return in about 2 weeks (around 11/22/2020).  Subjective:  Chief Complaint  Patient presents with   Wrist Pain    PT states she hurt her rt wrist DOI 10/29/20, still having numbness in the thumb and tingling in fingers. States she's also has pain on both sides of wrist.     History of Present Illness: Katie Davis is a 65 y.o. female who presents for evaluation of right hand  and wrist pain.  Approximately 1 week ago, she was moving a barrel at her house, when the metal drum landed on her right wrist.  She noted immediate pain and swelling.  She also has been complaining of numbness and tingling in her thumb, index and long finger.  The numbness and tingling continues to improve although she has persistent numbness and loss of sensation in the thumb.  In addition, she has some bruising over the dorsum of her right wrist.  She has limited range of motion, particularly supination.  She was given a compression sleeve type brace, which she is wearing.  She does feel as though the compression is helping.  She been taking over-the-counter pain medications.   Review of Systems: No fevers or chills No numbness or tingling No chest pain No shortness of breath No bowel or bladder dysfunction No GI distress No headaches   Medical History:  Past Medical History:  Diagnosis Date   Abnormal glandular Papanicolaou smear of cervix    Anxiety    Asthma    Body mass index 30.0-30.9, adult    Celiac disease    Chronic vascular disorder of intestine (HCC)    Fibromyalgia    Graves disease    Headache    Migrains   Hyperlipidemia    Hypertension    Hypothyroidism    IBS (irritable bowel syndrome) With constipation    Lupus (systemic lupus erythematosus) (HCC)    Obesity, unspecified  Osteoporosis    Raynaud disease    Sjogren's disease (HCC)     Past Surgical History:  Procedure Laterality Date   APPENDECTOMY     CHOLECYSTECTOMY     COLONOSCOPY WITH PROPOFOL N/A 06/10/2020   Procedure: COLONOSCOPY WITH PROPOFOL;  Surgeon: Lanelle Bal, DO;  Location: [facility];  Service: Endoscopy;  Laterality: N/A;  am appt   OTHER SURGICAL HISTORY     biopsies on both feet   REDUCTION MAMMAPLASTY      Family History  Problem Relation Age of Onset   Congestive Heart Failure Mother    Aneurysm Father    Celiac disease Brother    Melanoma Brother    Aneurysm  Brother        stomach   Stroke Sister    Celiac disease Sister    Irritable bowel syndrome Sister    Celiac disease Brother    Aneurysm Sister    Other Sister        multiple back surgeries   Colon cancer Neg Hx    Social History   Tobacco Use   Smoking status: Never   Smokeless tobacco: Never  Vaping Use   Vaping Use: Never used  Substance Use Topics   Alcohol use: Yes    Comment: wine occ   Drug use: Never    Allergies  Allergen Reactions   Bactrim [Sulfamethoxazole-Trimethoprim] Nausea And Vomiting   Doxycycline     Made body feel crazy, increased heart rate   Gluten Meal     Ceiliac disease    Keflex [Cephalexin]    Latex Hives   Levaquin [Levofloxacin] Nausea And Vomiting   Sulfa Antibiotics Nausea And Vomiting   Contrast Media [Iodinated Diagnostic Agents] Nausea And Vomiting and Rash    No outpatient medications have been marked as taking for the 11/08/20 encounter (Office Visit) with Oliver Barre, MD.    Objective: BP 132/79   Pulse 69   Ht 5\' 3"  (1.6 m)   Wt 164 lb (74.4 kg)   BMI 29.05 kg/m   Physical Exam:  General: Alert and oriented. and No acute distress. Gait: Normal gait.  Evaluation of the right wrist demonstrates mild diffuse swelling.  She has some healing ecchymosis on the dorsum of her wrist.  Tenderness to palpation diffusely about the wrist.  Flexion and extension is limited.  Severely limited supination.  Mild decreased sensation to the index and long finger.  Numbness to the thumb.  Positive Tinel's at the carpal tunnel.  IMAGING: I personally ordered and reviewed the following images  X-rays of the right wrist were obtained in clinic today and demonstrates no obvious injury.  There does appear to be a small step-off in the mid aspect of the distal radius, as demonstrated on the oblique view.  DRUJ remains intact.  No injuries to the ulnar styloid.  She has advanced CMC arthritis of the right thumb.  Impression: Minimally  displaced distal radius fracture.  New Medications:  No orders of the defined types were placed in this encounter.     , MD  11/08/2020 10:20 PM

## 2020-11-11 ENCOUNTER — Encounter: Payer: Medicare Other | Admitting: Orthopedic Surgery

## 2020-11-11 ENCOUNTER — Telehealth: Payer: Self-pay | Admitting: Orthopedic Surgery

## 2020-11-11 DIAGNOSIS — J4520 Mild intermittent asthma, uncomplicated: Secondary | ICD-10-CM | POA: Diagnosis not present

## 2020-11-11 DIAGNOSIS — J069 Acute upper respiratory infection, unspecified: Secondary | ICD-10-CM | POA: Diagnosis not present

## 2020-11-11 NOTE — Telephone Encounter (Signed)
She came in cast was very uncomfortable on her thumb, she does have some CMC arthritis. Looks like the cast may have slipped down some as well, as if swelling decreased. I removed pink short arm cast and replaced with new one, no charge since she just had it placed a few days ago. Kept the thumb area a bit more thin, and made sure she had good finger and thumb movement  She was happy with the cast change and will let us know if she has any further problems,   To Dr Desma Maxim

## 2020-11-11 NOTE — Telephone Encounter (Signed)
Dr Dallas Schimke' patient has issues regarding her cast that was put on Friday.   Please call her to discuss this as soon as possible  Thanks

## 2020-11-11 NOTE — Telephone Encounter (Signed)
She does not have to be on schedule I took off, no charges for cast  Just let me know when she is here, thanks

## 2020-11-11 NOTE — Telephone Encounter (Signed)
I cant talk to her during clinic Tell her to come in about 1150 I will look at it before lunch

## 2020-11-12 ENCOUNTER — Telehealth: Payer: Self-pay | Admitting: Orthopedic Surgery

## 2020-11-12 NOTE — Telephone Encounter (Signed)
Ms. Doering called this morning stating that she came in yesterday and Amy changed her cast for her.  She said at the time she just thought her asthma was acting up but this morning she has tested positive for Covid.  She cried and said how sorry she was and that didn't know she was sick.  She asked me to let Amy know and anyone else who may be effected by her being in the office yesterday.  I have notified Toniann Fail

## 2020-11-12 NOTE — Telephone Encounter (Signed)
I called her reassurance provided

## 2020-11-15 ENCOUNTER — Telehealth: Payer: Self-pay | Admitting: Orthopedic Surgery

## 2020-11-15 NOTE — Telephone Encounter (Signed)
Called back to patient; scheduled accordingly. °

## 2020-11-15 NOTE — Telephone Encounter (Signed)
Patient relays cast which she opted to get is causing discomfort - relays our office is aware of her testing positive for covid. States she has been on the anti-viral medication. Please advise about how she may have cast looked at if possible?

## 2020-11-15 NOTE — Telephone Encounter (Signed)
Discussed with Dr Dallas Schimke he will see her next week, first available spot, please

## 2020-11-20 ENCOUNTER — Other Ambulatory Visit: Payer: Self-pay

## 2020-11-20 ENCOUNTER — Ambulatory Visit (INDEPENDENT_AMBULATORY_CARE_PROVIDER_SITE_OTHER): Payer: Medicare Other | Admitting: Orthopedic Surgery

## 2020-11-20 ENCOUNTER — Ambulatory Visit: Payer: Medicare Other

## 2020-11-20 ENCOUNTER — Encounter: Payer: Self-pay | Admitting: Orthopedic Surgery

## 2020-11-20 DIAGNOSIS — S52501D Unspecified fracture of the lower end of right radius, subsequent encounter for closed fracture with routine healing: Secondary | ICD-10-CM

## 2020-11-20 NOTE — Patient Instructions (Signed)
Voltaren gel - available over the counter  Wrist Fracture Rehab Ask your health care provider which exercises are safe for you. Do exercises exactly as told by your health care provider and adjust them as directed. It is normal to feel mild stretching, pulling, tightness, or discomfort as you do these exercises. Stop right away if you feel sudden pain or your pain gets worse. Do not begin these exercises until told by your health care provider. Stretching and range-of-motion exercises These exercises warm up your muscles and joints and improve the movement and flexibility of your wrist and hand. These exercises also help to relieve pain,numbness, and tingling. Finger flexion and extension Sit or stand with your elbow at your side. Open and stretch your left / right fingers as wide as you can (extension). Hold this position for 10 seconds. Close your left / right fingers into a gentle fist (flexion). Hold this position for 10 seconds. Slowly return to the starting position. Repeat 10 times. Complete this exercise 1-2 times a day. Wrist flexion Bend your left / right elbow to a 90-degree angle (right angle) with your palm facing the floor. Bend your wrist forward so your fingers point toward the floor (flexion). Hold this position for 10 seconds. Slowly return to the starting position. Repeat 10 times. Complete this exercise 1-2 times a day. Wrist extension Bend your left / right elbow to a 90-degree angle (right angle) with your palm facing the floor. Bend your wrist backward so your fingers point toward the ceiling (extension). Hold this position for 10 seconds. Slowly return to the starting position. Repeat 10 times. Complete this exercise 1-2 times a day. Ulnar deviation Bend your left / right elbow to a 90-degree angle (right angle), and rest your forearm on a table with your palm facing down. Keeping your hand flat on the table, bend your left / right wrist toward your small finger  (pinkie). This is ulnar deviation. Hold this position for 10 seconds. Slowly return to the starting position. Repeat 10 times. Complete this exercise 1-2 times a day. Radial deviation Bend your left / right elbow to a 90-degree angle (right angle), and rest your forearm on a table with your palm facing down. Keeping your hand flat on the table, bend your left / right wrist toward your thumb. This is radial deviation. Hold this position for 10 seconds. Slowly return to the starting position. Repeat 10 times. Complete this exercise 1-2 times a day. Forearm rotation, supination Stand or sit with your left / right elbow bent to a 90-degree angle (right angle) at your side. Position your forearm so that the thumb is facing the ceiling (neutral position). Turn (rotate) your palm up toward the ceiling (supination), stopping when you feel a gentle stretch. Hold this position for 10 seconds. Slowly return to the starting position. Repeat 10 times. Complete this exercise 1-2 times a day. Forearm rotation, pronation Stand or sit with your left / right elbow bent to a 90-degree angle (right angle) at your side. Position your forearm so that the thumb is facing the ceiling (neutral position). Turn (rotate) your palm down toward the floor (pronation), stopping when you feel a gentle stretch. Hold this position for 10 seconds. Slowly return to the starting position. Repeat 10 times. Complete this exercise 1-2 times a day. Wrist flexion stretch  Extend your left / right arm in front of you and turn your palm down toward the floor. If told by your health care provider, bend your  left / right arm to a 90-degree angle (right angle) at your side. Using your uninjured hand, gently press over the back of your left / right hand to bend your wrist and fingers toward the floor (flexion). Go as far as you can to feel a stretch without causing pain. Hold this position for 10 seconds. Slowly return to the starting  position. Repeat 10 times. Complete this exercise 1-2 times a day. Wrist extension stretch  Extend your left / right arm in front of you and turn your palm up toward the ceiling. If told by your health care provider, bend your left / right arm to a 90-degree angle (right angle) at your side. Using your uninjured hand, gently press over the palm of your left / right hand to bend your wrist and fingers toward the floor (extension). Go as far as you can to feel a stretch without causing pain. Hold this position for 10 seconds. Slowly return to the starting position. Repeat 10 times. Complete this exercise 1-2 times a day. Forearm rotation stretch, supination Stand or sit with your arms at your sides. Bend your left / right elbow to a 90-degree angle (right angle). Using your uninjured hand, turn your left / right palm up toward the ceiling (assisted supination) until you feel a gentle stretch in the inside of your forearm. Hold this position for 10 seconds. Slowly return to the starting position. Repeat 10 times. Complete this exercise 1-2 times a day. Forearm rotation stretch, pronation Stand or sit with your arms at your sides. Bend your left / right elbow to a 90-degree angle (right angle). Using your uninjured hand, turn your left / right palm down toward the floor (assisted pronation) until you feel a gentle stretch in the top of your forearm. Hold this position for 10 seconds. Slowly return to the starting position. Repeat 10 times. Complete this exercise 1-2 times a day. Strengthening exercises These exercises build strength and endurance in your wrist and hand. Enduranceis the ability to use your muscles for a long time, even after they get tired. Wrist flexion Sit with your left / right forearm supported on a table. Your elbow should be at waist height. Rest your hand over the edge of the table, palm up. Gently grasp a 5 lb / kg weight (can of soup). Or, hold an exercise band or  tube in both hands, keeping your hands at the same level and hip distance apart. There should be slight tension in the exercise band or tube. Without moving your forearm or elbow, slowly bend your wrist up toward the ceiling (wrist flexion). Hold this position for 10 seconds. Slowly return to the starting position. Repeat 10 times. Complete this exercise 1-2 times a day. Wrist extension Sit with your left / right forearm supported on a table. Your elbow should be at waist height. Rest your hand over the edge of the table, palm down. Gently grasp a 5 lb / kg weight. Or, hold an exercise band or tube in both hands, keeping your hands at the same level and hip distance apart. There should be slight tension in the exercise band or tube. Without moving your forearm or elbow, slowly curl your hand up toward the ceiling (extension). Hold this position for 10 seconds. Slowly return to the starting position. Repeat 10 times. Complete this exercise 1-2 times a day. Forearm rotation, supination  Sit with your left / right forearm supported on a table. Your elbow should be at waist height.  Rest your hand over the edge of the table, palm down. Gently grasp a lightweight hammer near the head. As this exercise gets easier for you, try holding the hammer farther down the handle. Without moving your elbow, slowly turn (rotate) your palm up toward the ceiling (supination). Hold this position for 10 seconds. Slowly return to the starting position. Repeat 10 times. Complete this exercise 1-2 times a day. Forearm rotation, pronation  Sit with your left / right forearm supported on a table. Your elbow should be at waist height. Rest your hand over the edge of the table, palm up. Gently grasp a lightweight hammer near the head. As this exercise gets easier for you, try holding the hammer farther down the handle. Without moving your elbow, slowly turn (rotate) your palm down toward the floor (pronation). Hold  this position for 10 seconds. Slowly return to the starting position. Repeat 10 times. Complete this exercise 1-2 times a day. Grip strengthening  Hold one of these items in your left / right hand: a dense sponge, a stress ball, or a large, rolled sock. Slowly squeeze the object as hard as you can without increasing any pain. Hold your squeeze for 10 seconds. Slowly release your grip. Repeat 10 times. Complete this exercise 1-2 times a day. This information is not intended to replace advice given to you by your health care provider. Make sure you discuss any questions you have with your healthcare provider. Document Revised: 07/13/2019 Document Reviewed: 07/13/2019 Elsevier Patient Education  2022 [employer].

## 2020-11-20 NOTE — Progress Notes (Signed)
Orthopaedic Clinic Return  Assessment: Katie Davis is a 65 y.o. female with the following: Right distal radius fracture  Plan: Cast was removed in clinic, and repeat radiographs demonstrates stable alignment.  On the oblique views, there does appear to be a step-off at the joint.  Otherwise, x-rays are normal of the right wrist.  Her pain, swelling and associated numbness is drastically improved in the past 2 weeks.  We will transition her to a removable wrist brace.  I provided her with general wrist exercises for her to initiate immediately.  She should continue taking gabapentin as needed for her nerve pain.  I have advised her to work to get out of the brace, over the next couple of weeks.  We will see her back in 4 weeks for repeat evaluation.  Follow-up: Return in about 4 weeks (around 12/18/2020).   Subjective:  Chief Complaint  Patient presents with   Wrist Injury    Follow up    History of Present Illness: Katie Davis is a 65 y.o. female who returns to clinic for repeat evaluation of her right wrist.  She sustained an injury after a large drum fell on her wrist approximately 2 weeks ago.  X-rays demonstrate a small fracture.  At the last visit, she was placed in a cast due to the pain and swelling that she was dealing with.  She did okay with the cast.  She did note numbness, tingling as well as itching sensations while wearing the cast.  She does note upon removal of the cast, that her pain, swelling and associated symptoms have all improved.  Review of Systems: No fevers or chills Numbness to volar right thumb.   No chest pain No shortness of breath No bowel or bladder dysfunction No GI distress No headaches   Objective: There were no vitals taken for this visit.  Physical Exam:  Alert and oriented.  No acute distress.  Evaluation of the right wrist demonstrates improved swelling.  No bruising is appreciated.  She has tenderness to palpation along the length of  the ECU tendon.  Tenderness to palpation at the TFCC.  Near normal flexion and extension, as well as pronation and supination.  Slightly decreased sensation of the volar aspect of the right thumb.  IMAGING: I personally ordered and reviewed the following images:  X-rays of the right wrist were obtained in clinic, and compared to previous x-rays.  Small, intra-articular step-off which remains in good alignment compared to previous x-rays.  No evidence of callus formation.  No additional injuries are noted.  Impression: Stable right distal radius fracture.   Oliver Barre, MD 11/20/2020 10:50 PM

## 2020-11-26 ENCOUNTER — Encounter: Payer: Medicare Other | Admitting: Orthopedic Surgery

## 2020-11-26 ENCOUNTER — Telehealth: Payer: Self-pay | Admitting: Pulmonary Disease

## 2020-11-26 NOTE — Telephone Encounter (Signed)
I have called and LM on VM for the pt to call the office again in the morning.

## 2020-11-27 NOTE — Telephone Encounter (Signed)
LMTCB, will try again later and if still no answer will close per protocol and mail letter.

## 2020-11-29 NOTE — Telephone Encounter (Signed)
Spoke with the pt  She was dx with covid Aug 30th- non hospitalized and tx with paxlovid  She states her breathing is not back to baseline yet and having some chest congestion  OV with MW in [place] next wk since she is not able to come to [place] at all  Nothing further needed at this time

## 2020-11-29 NOTE — Telephone Encounter (Signed)
LMTCB  Will close message per triage protocol.   Letter printed and mailed.   Nothing further needed at this time.

## 2020-12-05 ENCOUNTER — Other Ambulatory Visit: Payer: Self-pay | Admitting: Gastroenterology

## 2020-12-05 ENCOUNTER — Ambulatory Visit: Payer: Medicare Other | Admitting: Internal Medicine

## 2020-12-05 ENCOUNTER — Telehealth: Payer: Self-pay | Admitting: Internal Medicine

## 2020-12-05 ENCOUNTER — Other Ambulatory Visit: Payer: Self-pay

## 2020-12-05 ENCOUNTER — Encounter: Payer: Self-pay | Admitting: Internal Medicine

## 2020-12-05 ENCOUNTER — Telehealth: Payer: Self-pay | Admitting: Pulmonary Disease

## 2020-12-05 ENCOUNTER — Ambulatory Visit (HOSPITAL_COMMUNITY)
Admission: RE | Admit: 2020-12-05 | Discharge: 2020-12-05 | Disposition: A | Discharge status: Home / Self Care | Payer: Medicare Other | Source: Ambulatory Visit | Attending: Internal Medicine | Admitting: Internal Medicine

## 2020-12-05 DIAGNOSIS — R0600 Dyspnea, unspecified: Secondary | ICD-10-CM | POA: Insufficient documentation

## 2020-12-05 DIAGNOSIS — R0609 Other forms of dyspnea: Secondary | ICD-10-CM

## 2020-12-05 DIAGNOSIS — R0602 Shortness of breath: Secondary | ICD-10-CM | POA: Insufficient documentation

## 2020-12-05 DIAGNOSIS — R110 Nausea: Secondary | ICD-10-CM | POA: Diagnosis not present

## 2020-12-05 DIAGNOSIS — R1084 Generalized abdominal pain: Secondary | ICD-10-CM | POA: Diagnosis not present

## 2020-12-05 DIAGNOSIS — K900 Celiac disease: Secondary | ICD-10-CM | POA: Diagnosis not present

## 2020-12-05 DIAGNOSIS — K551 Chronic vascular disorders of intestine: Secondary | ICD-10-CM | POA: Diagnosis not present

## 2020-12-05 NOTE — Telephone Encounter (Signed)
Called and spoke with pt and she stated that she forgot to make MW aware of her abd pain still.  She stated that her PCP told her that after having covid these symptoms can linger.   She stated that she is not able to eat a lot and she is still having diarrhea.  She has been drinking protein shakes and these have helped some.  She still  is some queasy.  She does have an appt next week with her GI doctor.  Just wanted MW to be aware.

## 2020-12-05 NOTE — Telephone Encounter (Signed)
Reviewed cxr and no acute change so no change in recommendations made at ov   Pt stated that she has reviewed the last 2 CXR and she is concerned about the last cxr showing enlarging of the heart?  She has called over to cardiology and they have an opening that they can get her in to see someone but they will need a referral for this. MW please advise if she needs to see cardiology and pt needs clarification of the changes of her last cxr.  thanks

## 2020-12-05 NOTE — Telephone Encounter (Signed)
VS please advise.   Pt stated that she was seen by VS in August.  She stated that they talked about this and she wanted to wait until she was some better.  She wanted to see if she should have this done now or wait until November or wait until her appt in December with VS.  Please advise. thanks

## 2020-12-05 NOTE — Progress Notes (Signed)
Katie Davis, female    DOB: December 03, 1955, 65 y.o.   MRN: [id number]   Brief patient profile:  49 yowf   never smoker followed by Dr Craige Cotta for asthma and dx with covid Aug 30th 2022 rx with paxlovid but not back to baseline so referred to pulmonary clinic for ad hoc visit.     History of Present Illness  12/05/2020  Pulmonary/ office eval/ [facility] / [place] [facility]  Chief Complaint  Patient presents with   Follow-up    Pt has been experiencing exhaustion, sob since covid (Aug 30 +). Feels like she hasn't improved since.   Baseline 15 min around the neighborhood, [place] but limited by extremity pain and gen weakness and fatigue  Acutely ill on 10 Nov 2020  st ha fever chest congestion rx paxlovid then zpak  HA worse p supper /still nasal congestion Dyspnea:  mb and back 80 ft and gives out  Cough: minimal / sense of globus with clear and nausea with it  Sleep: bed is flat  2pillows which is more than usual  SABA use: none now at night / uses albuterol once after supper   No obvious patterns in day to day or daytime variability or assoc excess/ purulent sputum or mucus plugs or hemoptysis or cp or chest tightness, subjective wheeze or overt sinus or hb symptoms.    Also denies any obvious fluctuation of symptoms with weather or environmental changes or other aggravating or alleviating factors except as outlined above   No unusual exposure hx or h/o childhood pna/ asthma or knowledge of premature birth.  Current Allergies, Complete Past Medical History, Past Surgical History, Family History, and Social History were reviewed in [employer] record.  ROS  The following are not active complaints unless bolded Hoarseness, sore throat, dysphagia, dental problems, itching, sneezing,  nasal congestion or discharge of excess mucus or purulent secretions, ear ache,   fever, chills, sweats, unintended wt loss or wt gain, classically pleuritic or exertional cp,  orthopnea  pnd or arm/hand swelling  or leg swelling, presyncope, palpitations, abdominal pain, anorexia, nausea, vomiting, diarrhea  or change in bowel habits or change in bladder habits, change in stools or change in urine, dysuria, hematuria,  rash, arthralgias, visual complaints, headache, numbness, weakness or ataxia or problems with walking or coordination,  change in mood or  memory.           Past Medical History:  Diagnosis Date   Abnormal glandular Papanicolaou smear of cervix    Anxiety    Asthma    Body mass index 30.0-30.9, adult    Celiac disease    Chronic vascular disorder of intestine (HCC)    Fibromyalgia    Graves disease    Headache    Migrains   Hyperlipidemia    Hypertension    Hypothyroidism    IBS (irritable bowel syndrome) With constipation    Lupus (systemic lupus erythematosus) (HCC)    Obesity, unspecified    Osteoporosis    Raynaud disease    Sjogren's disease (HCC)     Outpatient Medications Prior to Visit  Medication Sig Dispense Refill   acidophilus (RISAQUAD) CAPS capsule Take 1 capsule by mouth daily.     albuterol (VENTOLIN HFA) 108 (90 Base) MCG/ACT inhaler Inhale 1-2 puffs into the lungs every 6 (six) hours as needed for wheezing or shortness of breath.     Ascorbic Acid (VITAMIN C) 500 MG CHEW Chew by mouth daily.  atenolol (TENORMIN) 50 MG tablet Take 50 mg by mouth 2 (two) times daily.      B-Complex TABS Take by mouth daily.      calcium carbonate (TUMS - DOSED IN MG ELEMENTAL CALCIUM) 500 MG chewable tablet Chew 1 tablet by mouth daily.     Cholecalciferol (VITAMIN D3 GUMMIES) 25 MCG (1000 UT) CHEW Chew 6,000 Units by mouth daily.     clonazePAM (KLONOPIN) 0.5 MG tablet Take 0.25-0.5 mg by mouth 2 (two) times daily as needed for anxiety.      cyclobenzaprine (FLEXERIL) 10 MG tablet Take 10 mg by mouth 3 (three) times daily as needed for muscle spasms.      diclofenac (VOLTAREN) 50 MG EC tablet Take 1 tablet (50 mg total) by mouth 2 (two) times  daily. 20 tablet 0   ELDERBERRY PO Take 1 capsule by mouth daily.     fluconazole (DIFLUCAN) 100 MG tablet Take 1 tablet (100 mg total) by mouth daily. 7 tablet 0   fluocinonide ointment (LIDEX) 0.05 % Apply 1 application topically 2 (two) times daily. 30 g 0   fluorometholone (FML) 0.1 % ophthalmic suspension Place 1 drop into both eyes in the morning and at bedtime.     fluticasone furoate-vilanterol (BREO ELLIPTA) 100-25 MCG/INH AEPB Inhale 1 puff into the lungs daily. 30 each 5   gabapentin (NEURONTIN) 100 MG capsule Take 1 capsule (100 mg total) by mouth 3 (three) times daily. 90 capsule 0   ibuprofen (ADVIL,MOTRIN) 800 MG tablet Take 800 mg by mouth every 8 (eight) hours as needed for moderate pain.     linaclotide (LINZESS) 145 MCG CAPS capsule Take 1 capsule (145 mcg total) by mouth daily before breakfast. 30 capsule 5   meclizine (ANTIVERT) 12.5 MG tablet Take 12.5-25 mg by mouth every 6 (six) hours as needed for dizziness.      Omega-3 1000 MG CAPS Take 1,000 mg by mouth 2 (two) times daily with a meal.      ondansetron (ZOFRAN) 4 MG tablet Take 1 tablet (4 mg total) by mouth every 8 (eight) hours as needed for nausea or vomiting. 12 tablet 0   oxyCODONE-acetaminophen (PERCOCET/ROXICET) 5-325 MG tablet Take 1 tablet by mouth 2 (two) times daily as needed for moderate pain or severe pain.      Polyethyl Glycol-Propyl Glycol (SYSTANE OP) Place 1 drop into both eyes daily.     RESTASIS 0.05 % ophthalmic emulsion Place 1 drop into both eyes 2 (two) times daily.     rosuvastatin (CRESTOR) 5 MG tablet Take 5 mg by mouth 3 (three) times a week.     SYNTHROID 112 MCG tablet Take 112 mcg by mouth daily.     No facility-administered medications prior to visit.     Objective:     BP 132/80 (BP Location: Left Arm, Patient Position: Sitting)   Pulse 67   Temp 98 F (36.7 C) (Temporal)   Ht 5\' 2"  (1.575 m)   Wt 169 lb 1.3 oz (76.7 kg)   SpO2 97% Comment: ra  BMI 30.93 kg/m   SpO2: 97 %  (ra)  Amb wf nad   HEENT : pt wearing mask not removed for exam due to covid -19 concerns.    NECK :  without JVD/Nodes/TM/ nl carotid upstrokes bilaterally   LUNGS: no acc muscle use,  Nl contour chest which is clear to A and P bilaterally without cough on insp or exp maneuvers   CV:  RRR  no s3 or murmur or increase in P2, and no edema   ABD:  soft and nontender with nl inspiratory excursion in the supine position. No bruits or organomegaly appreciated, bowel sounds nl  MS:  Nl gait/ ext warm without deformities, calf tenderness, cyanosis or clubbing No obvious joint restrictions   SKIN: warm and dry without lesions    NEURO:  alert, approp, nl sensorium with  no motor or cerebellar deficits apparent.    CXR PA and Lateral:   12/05/2020 :    I personally reviewed images and agree with radiology impression as follows:   Minimal enlargement of cardiac silhouette and lingular scarring. No acute abnormalities.       Assessment   No problem-specific Assessment & Plan notes found for this encounter.     Sandrea Hughs, MD 12/05/2020

## 2020-12-05 NOTE — Patient Instructions (Addendum)
Try prilosec otc 20mg   Take 30-60 min before first meal of the day and Pepcid ac (famotidine) 20 mg one @  bedtime until cough is completely gone for at least a week without the need for cough suppression   GERD (REFLUX)  is an extremely common cause of respiratory symptoms just like yours , many times with no obvious heartburn at all.    It can be treated with medication, but also with lifestyle changes including elevation of the head of your bed (ideally with 6 -8inch blocks under the headboard of your bed),  Smoking cessation, avoidance of late meals, excessive alcohol, and avoid fatty foods, chocolate, peppermint, colas, red wine, and acidic juices such as orange juice.  NO MINT OR MENTHOL PRODUCTS SO NO COUGH DROPS  USE SUGARLESS CANDY INSTEAD (Jolley ranchers or Stover's or Life Savers) or even ice chips will also do - the key is to swallow to prevent all throat clearing. NO OIL BASED VITAMINS - use powdered substitutes.  Avoid fish oil when coughing.     Please remember to go to the  x-ray department  @  [facility] for your tests - we will call you with the results when they are available     Please remember to go to the lab department   for your tests - we will call you with the results when they are available.      Follow up with Dr [facility] in a few weeks

## 2020-12-06 NOTE — Telephone Encounter (Signed)
Fine with me - dx is DOE - other option is for [place] to order Echo and have them read it whichever works best for her

## 2020-12-06 NOTE — Telephone Encounter (Signed)
Please schedule her for PFT prior to her next visit.

## 2020-12-06 NOTE — Telephone Encounter (Signed)
I called and spoke with patient regarding Dr. Marthe Patch and stated he did want PFT  before next visit in [employer] office. They do not do PFT there so patient wanted it at [facility]. I have sent in order and informed patient that [facility] will be calling to schedule it and informed patient to bring covid vaccine card when going for PFT. Patient verbalized understanding, nothing further needed.

## 2020-12-06 NOTE — Assessment & Plan Note (Signed)
Onset with covid Nov 10 2020 rx paxlovid then zpak - 12/05/2020   Walked on RA  x  3  lap(s) =  approx 450 @ fast pace, stopped due to end of study mod sob  with lowest 02 sats 96%  - CM on cxr 12/05/20 > pt requested referral to cardiology 12/06/2020 >>>     Advised pt she is not yet out even 4 weeks since her covid and actually appearing to be doing well x for persistent cough/ weakness with no evidence of flare in asthma   rec Max rx for gerd while coughing then ok to d/c  If still coughing on max gerd rx may need alternative to DPI for asthma (eg low dose symbiocrt with spacer would be the least irritating)          Each maintenance medication was reviewed in detail including emphasizing most importantly the difference between maintenance and prns and under what circumstances the prns are to be triggered using an action plan format where appropriate.  Total time for H and P, chart review, counseling, reviewing hfa/dpi device(s) , directly observing portions of ambulatory 02 saturation study/ and generating customized AVS unique to this office visit with pt new to me / same day charting = 

## 2020-12-06 NOTE — Telephone Encounter (Signed)
Called and spoke with patient. She verbalized understanding. She stated that she would like to proceed with the cardiology referral to the [facility] department. I advised her that I would go ahead and place the referral for her.   Order has placed.   Nothing further needed at time of call.

## 2020-12-10 ENCOUNTER — Telehealth: Payer: Self-pay | Admitting: Internal Medicine

## 2020-12-10 ENCOUNTER — Ambulatory Visit: Payer: Medicare Other | Admitting: Gastroenterology

## 2020-12-10 ENCOUNTER — Encounter: Payer: Self-pay | Admitting: Gastroenterology

## 2020-12-10 ENCOUNTER — Telehealth: Payer: Self-pay

## 2020-12-10 ENCOUNTER — Other Ambulatory Visit: Payer: Self-pay

## 2020-12-10 VITALS — BP 112/73 | HR 69 | Temp 97.1°F | Ht 62.0 in | Wt 173.6 lb

## 2020-12-10 DIAGNOSIS — K551 Chronic vascular disorders of intestine: Secondary | ICD-10-CM | POA: Diagnosis not present

## 2020-12-10 DIAGNOSIS — R1084 Generalized abdominal pain: Secondary | ICD-10-CM

## 2020-12-10 DIAGNOSIS — R110 Nausea: Secondary | ICD-10-CM

## 2020-12-10 DIAGNOSIS — K900 Celiac disease: Secondary | ICD-10-CM | POA: Diagnosis not present

## 2020-12-10 NOTE — Patient Instructions (Signed)
I will be in touch with you once I have looked at [facility] CTs.  I have ordered labs to evaluate your celiac disease and for nutritional deficiencies. Our office will contact [facility] to see if they can be added to last weeks labs. If not, we will let you know to take orders and have done. Just make sure you request Dr. Scharlene Gloss office to send me a copy.

## 2020-12-10 NOTE — Progress Notes (Signed)
Primary Care Physician: Benita Stabile, MD  Primary Gastroenterologist:  Hennie Duos. Marletta Lor, DO   Chief Complaint  Patient presents with   Diarrhea    Anything she eats goes right through her    Abdominal Pain    "Constant"    HPI: Katie Davis is a 65 y.o. female here for follow-up.  She was last seen in the office in February 2022.  Seen for IBS, diverticulosis, celiac disease (biopsy confirmed). 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  Completed colonoscopy in 05/2020: diverticulosis, non-bleeding internal hemorrhoids.   CT abdomen without contrast (ordered by PCP) September 10, 2020 for chronic abdominal pain with no definitive acute abnormality seen in the abdomen.  TTG IgA March 2022 was 2  Patient describes several GI symptoms, states over the years she has figured how which symptoms are associated with his condition she has. States she has history of severe celiac disease, taking over 10 years to diagnose, and she almost died. More recently she had been doing reasonably well until she got Covid in August.  States she was treated with Paxlovid.  Initially improved but then developed rebound to Covid. She is still recovering.  With significant fatigue.  Covid flared several of her GI symptoms.  Prior to Covid she was taking Linzess to move her bowels.  She would take anywhere from 72 to 145 mcg daily as needed.  When she got sick she developed diarrhea and stopped Linzess.  Her appetite has been poor, trying to eat small amounts of food.  Significant postprandial diarrhea.  She lost a total of 12 pounds although her weight has finally stabilized. Husband has similar symptoms so she felt like symptoms were Covid-related.  Just last night was able to eat chicken and brown rice without diarrhea.  First meal in several weeks that she tolerated.  Continues to avoid gluten.  Overall abdominal pain has been  stable, sometimes has some discomfort associated with gas pockets, chronic symptom for her.  At baseline she has chronic nausea which she contributes to the artery blockage in her abdomen.  States she was diagnosed in 2018 in [place] when she was hospitalized for one of her severe attacks of "gluten contamination".  States she was advised to have yearly CT, she did complete 1 in 2019 and states the blockage was stable. Patient reports allergic reaction to IV contrast for prior CT remotely (she states "way before the one in 2019").  Developed pain, vomiting, rash almost immediately.  No swelling or shortness of breath. Appears to have tolerated IV contrast study (CTA) in 06/2017. Patient vaguely recalls premedication for the study.   CTA abdomen and pelvis April 2019: Mild to moderate stenosis at the origin of the SMA, patent proximal portion.  Celiac and inferior mesenteric arteries patent.  CT abdomen/pelvis April 2018: 50% stenosis at the origin of the SMA.   Current Outpatient Medications  Medication Sig Dispense Refill   acidophilus (RISAQUAD) CAPS capsule Take 1 capsule by mouth daily.     albuterol (VENTOLIN HFA) 108 (90 Base) MCG/ACT inhaler Inhale 1-2 puffs into the lungs every 6 (six) hours as needed for wheezing or shortness of breath.     Ascorbic Acid (VITAMIN C) 500 MG CHEW Chew by mouth daily.      atenolol (TENORMIN) 50 MG tablet Take 50 mg by mouth 2 (two) times daily.      B-Complex  TABS Take by mouth daily.      calcium carbonate (TUMS - DOSED IN MG ELEMENTAL CALCIUM) 500 MG chewable tablet Chew 1 tablet by mouth daily.     Cholecalciferol (VITAMIN D3 GUMMIES) 25 MCG (1000 UT) CHEW Chew 6,000 Units by mouth daily.     clonazePAM (KLONOPIN) 0.5 MG tablet Take 0.25-0.5 mg by mouth 2 (two) times daily as needed for anxiety.      cyclobenzaprine (FLEXERIL) 10 MG tablet Take 10 mg by mouth 3 (three) times daily as needed for muscle spasms.      ELDERBERRY PO Take 1 capsule by  mouth daily.     fluorometholone (FML) 0.1 % ophthalmic suspension Place 1 drop into both eyes in the morning and at bedtime.     fluticasone furoate-vilanterol (BREO ELLIPTA) 100-25 MCG/INH AEPB Inhale 1 puff into the lungs daily. 30 each 5   ibuprofen (ADVIL,MOTRIN) 800 MG tablet Take 800 mg by mouth every 8 (eight) hours as needed for moderate pain.     meclizine (ANTIVERT) 12.5 MG tablet Take 12.5-25 mg by mouth every 6 (six) hours as needed for dizziness.      Omega-3 1000 MG CAPS Take 1,000 mg by mouth 2 (two) times daily with a meal.      ondansetron (ZOFRAN) 4 MG tablet Take 1 tablet (4 mg total) by mouth every 8 (eight) hours as needed for nausea or vomiting. 12 tablet 0   oxyCODONE-acetaminophen (PERCOCET/ROXICET) 5-325 MG tablet Take 1 tablet by mouth 2 (two) times daily as needed for moderate pain or severe pain.      Polyethyl Glycol-Propyl Glycol (SYSTANE OP) Place 1 drop into both eyes daily.     RESTASIS 0.05 % ophthalmic emulsion Place 1 drop into both eyes 2 (two) times daily.     rosuvastatin (CRESTOR) 5 MG tablet Take 5 mg by mouth 4 (four) times a week.     SYNTHROID 112 MCG tablet Take 112 mcg by mouth daily.     diclofenac (VOLTAREN) 50 MG EC tablet Take 1 tablet (50 mg total) by mouth 2 (two) times daily. 20 tablet 0   fluconazole (DIFLUCAN) 100 MG tablet Take 1 tablet (100 mg total) by mouth daily. 7 tablet 0   No current facility-administered medications for this visit.    Allergies as of 12/10/2020 - Review Complete 12/10/2020  Allergen Reaction Noted   Bactrim [sulfamethoxazole-trimethoprim] Nausea And Vomiting 11/16/2018   Doxycycline  05/07/2020   Gluten meal  11/16/2018   Keflex [cephalexin]  12/09/2018   Latex Hives 11/16/2018   Levaquin [levofloxacin] Nausea And Vomiting 11/16/2018   Sulfa antibiotics Nausea And Vomiting 11/16/2018   Contrast media [iodinated diagnostic agents] Nausea And Vomiting and Rash 11/16/2018    ROS:  General: Negative for   fever, chills. positive for fatigue, weakness.  See HPI ENT: Negative for hoarseness, difficulty swallowing , nasal congestion. CV: Negative for chest pain, angina, palpitations, dyspnea on exertion, peripheral edema.  Respiratory: Negative for dyspnea at rest, dyspnea on exertion, cough, sputum, wheezing.  GI: See history of present illness. GU:  Negative for dysuria, hematuria, urinary incontinence, urinary frequency, nocturnal urination.  Endo: Negative for unusual weight change.    Physical Examination:   BP 112/73   Pulse 69   Temp (!) 97.1 F (36.2 C)   Ht 5\' 2"  (1.575 m)   Wt 173 lb 9.6 oz (78.7 kg)   BMI 31.75 kg/m   General: Well-nourished, well-developed in no acute distress.  Eyes: No icterus.  Mouth: masked Lungs: Clear to auscultation bilaterally.  Heart: Regular rate and rhythm, no murmurs rubs or gallops.  Abdomen: Bowel sounds are normal, nontender, nondistended, no hepatosplenomegaly or masses, no abdominal bruits or hernia , no rebound or guarding.   Extremities: No lower extremity edema. No clubbing or deformities. Neuro: Alert and oriented x 4   Skin: Warm and dry, no jaundice.   Psych: Alert and cooperative, normal mood and affect.  Labs:  No results found for: WBC, HGB, HCT, MCV, PLT No results found for: CREATININE, BUN, NA, K, CL, CO2 No results found for: ALT, AST, GGT, ALKPHOS, BILITOT   Imaging Studies: DG Chest 2 View  Result Date: 12/05/2020 CLINICAL DATA:  Shortness of breath post recent COVID 11/12/2020, history asthma, hypertension EXAM: CHEST - 2 VIEW COMPARISON:  08/08/2020 FINDINGS: Enlargement of cardiac silhouette. Mediastinal contours and pulmonary vascularity normal. Linear scarring at lingula stable. No acute infiltrate, pleural effusion, or pneumothorax. Osseous structures demineralized. IMPRESSION: Minimal enlargement of cardiac silhouette and lingular scarring. No acute abnormalities. Electronically Signed   By: Ulyses Southward M.D.   On:  12/05/2020 10:48   DG Wrist Complete Right  Result Date: 11/21/2020 Please see office note dictated for this date of service for XR results     Assessment:  Pleasant 65 y/o female with history of celiac disease, 50% stenosis of SMA, constipation presenting for follow up.   Recently contracted Covid in 10/2020, treated with Paxlovid, complicated by rebound Covid. Slowly recovering but still with significant fatigue. Recent postprandial diarrhea likely due to Covid. Fortunately she is starting to notice improvement over the last 24 hours and tolerated meals yesterday. Suspect she will continue to return to her baseline.   Celiac disease: diagnosed remotely, small bowel biopsy confirmed. Strictly avoids gluten. Concerned about any nutritional deficiencies. Will update labs.  SMA stenosis (50%) noted initially in 2015 per patient. Last contrast study at OSH 2019 as outlined. Patient had IV contrast reaction but does not sound like any swelling, shortness of breath, and would likely tolerate premedication with prednisone and benadryl. She cannot recall if she was premedicated with 2019 CTA but does confidently say her reaction was prior to that study. Will refer to vascular for surveillance, ?capable of mesenteric doppler if patient concerned about pursuing contrasted study.   Plan: Monitor for now. Will call if symptoms worsens.  Update labs.  Reviewed CTs from [facility] as outlined above. Will initiate referral to vascular for surveillance of SMA stenosis per patient request.

## 2020-12-10 NOTE — Telephone Encounter (Signed)
FYI: The pt had advised me that the Linzess really helped her bloating and abdomen pain. It also helped her to have regular bowel movements. Also she states the stress in her life with her husband having a stroke 2 years ago is what she found out is making her have flairs with her abdomen so much. She stated she forgot to tell you.

## 2020-12-10 NOTE — Telephone Encounter (Signed)
[phone]  PLEASE CALL PATIENT, SHE HAS SOME QUESTIONS ABOUT HER MEDICATION

## 2020-12-11 NOTE — Telephone Encounter (Signed)
Returned the pt's call she could not remember why she called, but she asked about the bloodwork which I explained to her we are waiting to get it from PCP (just contacted them yesterday). Also she stated she will call back if she remembers.

## 2020-12-12 ENCOUNTER — Telehealth: Payer: Self-pay

## 2020-12-12 NOTE — Addendum Note (Signed)
Addended by: Tiffany Kocher on: 12/12/2020 12:46 PM   Modules accepted: Orders

## 2020-12-12 NOTE — Telephone Encounter (Signed)
Noted  

## 2020-12-12 NOTE — Telephone Encounter (Signed)
error 

## 2020-12-12 NOTE — Telephone Encounter (Signed)
Communication noted.  

## 2020-12-12 NOTE — Telephone Encounter (Signed)
Pt phoned this morning and LM on my vm stating you wanted to know the cocktail that was given to her in the ER and she stated it was a IV form of Benadryl with a steroid. She stated its not like OTC benadryl. Pt also wants to know are you going to schedule her a CAT Scan?  Some of her lab results are in her chart that were added on and I just received the add on signature page where I had to sign it.

## 2020-12-12 NOTE — Telephone Encounter (Signed)
I'm sorry Katie Davis I had called the lab hours ago and I thought I sent you a message that it was still processing and they said it will be end of day or tomorrow before we hear anything. (Because it was after I came back from lunch)

## 2020-12-12 NOTE — Telephone Encounter (Signed)
noted 

## 2020-12-12 NOTE — Telephone Encounter (Signed)
Dena, see previous result note. I will reach out to patient via mychart message she also sent to let her know I'm waiting for lab results before providing her complete plan.   I just need to make sure that the lab had enough blood to do the B12 and folate because they have been pending for days.

## 2020-12-16 ENCOUNTER — Encounter: Payer: Self-pay | Admitting: Pulmonary Disease

## 2020-12-16 ENCOUNTER — Other Ambulatory Visit: Payer: Self-pay

## 2020-12-16 ENCOUNTER — Ambulatory Visit: Payer: Medicare Other | Admitting: Pulmonary Disease

## 2020-12-16 VITALS — BP 118/72 | HR 77 | Temp 98.1°F | Ht 63.0 in | Wt 172.1 lb

## 2020-12-16 DIAGNOSIS — J4550 Severe persistent asthma, uncomplicated: Secondary | ICD-10-CM | POA: Diagnosis not present

## 2020-12-16 DIAGNOSIS — K900 Celiac disease: Secondary | ICD-10-CM

## 2020-12-16 DIAGNOSIS — R1084 Generalized abdominal pain: Secondary | ICD-10-CM

## 2020-12-16 DIAGNOSIS — K551 Chronic vascular disorders of intestine: Secondary | ICD-10-CM

## 2020-12-16 DIAGNOSIS — R110 Nausea: Secondary | ICD-10-CM

## 2020-12-16 DIAGNOSIS — Z8616 Personal history of COVID-19: Secondary | ICD-10-CM | POA: Diagnosis not present

## 2020-12-16 NOTE — Progress Notes (Signed)
B 12

## 2020-12-16 NOTE — Telephone Encounter (Signed)
This is what I was afraid of. Would offer patient to go back for B12/folate if they don't have enough blood.

## 2020-12-16 NOTE — Telephone Encounter (Signed)
FYI: Phoned to [facility] spoke with Shanda Bumps was advised when she looked in the system for the B-12 and Folate that it is saying quantity may not be enough. So she sent a email to the processing dept to make sure of this. She advised before we send pt back she wanted to make sure of this and they will send [place] documentation either way. So I will be on the lookout for it.

## 2020-12-16 NOTE — Telephone Encounter (Signed)
Pt called stating the lab called her to advise that there was not enough blood left to do the B12 and folate. Will re-print form and pt will pick up in the morning.

## 2020-12-16 NOTE — Progress Notes (Signed)
[facility]  Chief Complaint  Patient presents with   Follow-up    Wants to discuss CXR and labs since last OV with wert. SOB is the same as last visit still has SOB with exertion. But feels that she cant get a full deep breath     Constitutional:  BP 118/72 (BP Location: Left Arm, Patient Position: Sitting)   Pulse 77   Temp 98.1 F (36.7 C) (Temporal)   Ht 5\' 3"  (1.6 m)   Wt 172 lb 1.3 oz (78.1 kg)   SpO2 97%   BMI 30.48 kg/m   Past Medical History:  Anxiety, Celiac disease, Fibromyalgia, Grave's disease, HLD, Migraine headaches, HTN, IBS, Lupus, Osteoporosis, Raynaud disease, Sjogren's disease, COVID 01 November 2020  Past Surgical History:  She  has a past surgical history that includes Reduction mammaplasty; Cholecystectomy; Appendectomy; OTHER SURGICAL HISTORY; and Colonoscopy with propofol (N/A, 06/10/2020).  Brief Summary:  Katie Davis is a 65 y.o. female with asthma.      Subjective:   She was doing well until end of August. She contracted COVID.  Treated with paxlovid, but then had rebound symptoms.  Had chest xray that showed mild cardiomegaly and lingular scarring.  She is slowly feeling better.  Not having chest tightness anymore and able to take deeper breaths.  No having wheeze, cough, or sputum.  No leg swelling.  Hasn't returned to gym yet, but try to do exercises at home.  Physical Exam:   Appearance - well kempt   ENMT - no sinus tenderness, no oral exudate, no LAN, Mallampati 3 airway, no stridor  Respiratory - equal breath sounds bilaterally, no wheezing or rales  CV - s1s2 regular rate and rhythm, no murmurs  Ext - no clubbing, no edema  Skin - no rashes  Psych - normal mood and affect     Pulmonary testing:    Chest Imaging:    Social History:  She  reports that she has never smoked. She has never used smokeless tobacco. She reports current alcohol use. She reports that she does not use  drugs.  Family History:  Her family history includes Aneurysm in her brother, father, and sister; Celiac disease in her brother, brother, and sister; Congestive Heart Failure in her mother; Irritable bowel syndrome in her sister; Melanoma in her brother; Other in her sister; Stroke in her sister.     Assessment/Plan:   Severe, persistent asthma. - continue breo 100 one puff daily - prn albuterol - she has PFT ordered at [facility]; she will call to switch to [facility] if she can arrange for transportation  History of COVID 19 infection. - clinically improving - continue conservative management  Mild cardiomegaly on chest xray. - difficult to say whether this is significant especially since she is clinically improving - defer further testing at this time  Time Spent Involved in Patient Care on Day of Examination:  32 minutes  Follow up:   Patient Instructions  Keep your appointment for March 03, 2021  Medication List:   Allergies as of 12/16/2020       Reactions   Bactrim [sulfamethoxazole-trimethoprim] Nausea And Vomiting   Doxycycline    Made body feel crazy, increased heart rate   Gluten Meal    Ceiliac disease   Keflex [cephalexin]    Latex Hives   Levaquin [levofloxacin] Nausea And Vomiting   Sulfa Antibiotics Nausea And Vomiting   Contrast Media [iodinated Diagnostic Agents] Nausea And Vomiting, Rash  Medication List        Accurate as of December 16, 2020 12:33 PM. If you have any questions, ask your nurse or doctor.          acidophilus Caps capsule Take 1 capsule by mouth daily.   albuterol 108 (90 Base) MCG/ACT inhaler Commonly known as: VENTOLIN HFA Inhale 1-2 puffs into the lungs every 6 (six) hours as needed for wheezing or shortness of breath.   atenolol 50 MG tablet Commonly known as: TENORMIN Take 50 mg by mouth 2 (two) times daily.   B-Complex Tabs Take by mouth daily.   calcium carbonate 500 MG chewable tablet Commonly known  as: TUMS - dosed in mg elemental calcium Chew 1 tablet by mouth daily.   clonazePAM 0.5 MG tablet Commonly known as: KLONOPIN Take 0.25-0.5 mg by mouth 2 (two) times daily as needed for anxiety.   cyclobenzaprine 10 MG tablet Commonly known as: FLEXERIL Take 10 mg by mouth 3 (three) times daily as needed for muscle spasms.   ELDERBERRY PO Take 1 capsule by mouth daily.   fluorometholone 0.1 % ophthalmic suspension Commonly known as: FML Place 1 drop into both eyes in the morning and at bedtime.   fluticasone furoate-vilanterol 100-25 MCG/INH Aepb Commonly known as: Breo Ellipta Inhale 1 puff into the lungs daily.   ibuprofen 800 MG tablet Commonly known as: ADVIL Take 800 mg by mouth every 8 (eight) hours as needed for moderate pain.   meclizine 12.5 MG tablet Commonly known as: ANTIVERT Take 12.5-25 mg by mouth every 6 (six) hours as needed for dizziness.   Omega-3 1000 MG Caps Take 1,000 mg by mouth 2 (two) times daily with a meal.   ondansetron 4 MG tablet Commonly known as: Zofran Take 1 tablet (4 mg total) by mouth every 8 (eight) hours as needed for nausea or vomiting.   oxyCODONE-acetaminophen 5-325 MG tablet Commonly known as: PERCOCET/ROXICET Take 1 tablet by mouth 2 (two) times daily as needed for moderate pain or severe pain.   Restasis 0.05 % ophthalmic emulsion Generic drug: cycloSPORINE Place 1 drop into both eyes 2 (two) times daily.   rosuvastatin 5 MG tablet Commonly known as: CRESTOR Take 5 mg by mouth 4 (four) times a week.   Synthroid 112 MCG tablet Generic drug: levothyroxine Take 112 mcg by mouth daily.   SYSTANE OP Place 1 drop into both eyes daily.   Vitamin C 500 MG Chew Chew by mouth daily.   Vitamin D3 Gummies 25 MCG (1000 UT) Chew Generic drug: Cholecalciferol Chew 6,000 Units by mouth daily.        Signature:  Coralyn Helling, MD [facility] Pulmonary/Critical Care Pager - [phone] 12/16/2020, 12:33 PM

## 2020-12-16 NOTE — Telephone Encounter (Signed)
Pt is aware of the lab form left up front for her.

## 2020-12-16 NOTE — Telephone Encounter (Signed)
noted 

## 2020-12-16 NOTE — Telephone Encounter (Signed)
thanks

## 2020-12-16 NOTE — Patient Instructions (Signed)
Keep your appointment for March 03, 2021

## 2020-12-16 NOTE — Telephone Encounter (Signed)
Noted. Will call pt 

## 2020-12-17 ENCOUNTER — Encounter: Payer: Self-pay | Admitting: Gastroenterology

## 2020-12-17 ENCOUNTER — Encounter: Payer: Self-pay | Admitting: Orthopedic Surgery

## 2020-12-17 ENCOUNTER — Ambulatory Visit (INDEPENDENT_AMBULATORY_CARE_PROVIDER_SITE_OTHER): Payer: Medicare Other | Admitting: Orthopedic Surgery

## 2020-12-17 ENCOUNTER — Encounter: Payer: Self-pay | Admitting: Internal Medicine

## 2020-12-17 ENCOUNTER — Telehealth: Payer: Self-pay

## 2020-12-17 ENCOUNTER — Telehealth: Payer: Self-pay | Admitting: Internal Medicine

## 2020-12-17 DIAGNOSIS — S52501D Unspecified fracture of the lower end of right radius, subsequent encounter for closed fracture with routine healing: Secondary | ICD-10-CM

## 2020-12-17 DIAGNOSIS — K551 Chronic vascular disorders of intestine: Secondary | ICD-10-CM

## 2020-12-17 LAB — IRON,TIBC AND FERRITIN PANEL
Ferritin: 160 ng/mL — ABNORMAL HIGH (ref 15–150)
Iron Saturation: 34 % (ref 15–55)
Iron: 114 ug/dL (ref 27–139)
Total Iron Binding Capacity: 339 ug/dL (ref 250–450)
UIBC: 225 ug/dL (ref 118–369)

## 2020-12-17 LAB — B12 AND FOLATE PANEL
Folate: 8.4 ng/mL (ref >3.0)
Vitamin B-12: 727 pg/mL (ref 232–1245)

## 2020-12-17 LAB — VITAMIN D 25 HYDROXY (VIT D DEFICIENCY, FRACTURES): Vit D, 25-Hydroxy: 48 ng/mL (ref 30.0–100.0)

## 2020-12-17 LAB — TISSUE TRANSGLUTAMINASE, IGA: Transglutaminase IgA: <2 U/mL (ref 0–3)

## 2020-12-17 LAB — SPECIMEN STATUS REPORT

## 2020-12-17 NOTE — Telephone Encounter (Signed)
[phone] PLEASE CALL PATIENT ABOUT HER LABS

## 2020-12-17 NOTE — Telephone Encounter (Signed)
Pt was sent a MyChart message from [facility] this morning regarding her labs.

## 2020-12-17 NOTE — Telephone Encounter (Signed)
Got them

## 2020-12-17 NOTE — Telephone Encounter (Signed)
Pt's B12 and folate on your desk from the add-on

## 2020-12-17 NOTE — Telephone Encounter (Signed)
B12 727 Folate 8.4 Labs to be scanned.   Patient needs follow up ov in four months. Please schedule.

## 2020-12-17 NOTE — Telephone Encounter (Signed)
Forwarded by Hali Marry to [place] to schedule appt in 4 months

## 2020-12-17 NOTE — Progress Notes (Signed)
[facility]th[facility] Clinic Return  Assessment: Katie Davis is a 65 y.o. female with the following: Right distal radius fracture  Plan: Patient's pain and range of motion continues to improve.  She does have some pain on the ulnar side of her wrist.  Sensation to the fingers in the median nerve distribution is also improving, although there is some residual numbness.  As long as her symptoms continue to improve, nothing further is needed.  If she is struggling with the range of motion, continues to have pain, we could consider a referral to Occupational Therapy.  Follow-up as needed.  Follow-up: Return if symptoms worsen or fail to improve.   Subjective:  Chief Complaint  Patient presents with   Wrist Pain    Fx right wrist//follow up 2 wk    History of Present Illness: Katie Davis is a 65 y.o. female who returns to clinic for repeat evaluation of her right wrist.  She sustained an injury to her right wrist and fingers approximately 6 weeks ago.  Her pain is improved.  Sensation is improving to her fingers.  She is no longer using a wrist brace.  She has been doing some exercises on her own.  No medications on a consistent basis.  Review of Systems: No fevers or chills Numbness to volar right thumb.   No chest pain No shortness of breath No bowel or bladder dysfunction No GI distress No headaches   Objective: There were no vitals taken for this visit.  Physical Exam:  Alert and oriented.  No acute distress.  Deformity tenderness palpation of the TFCC.  Some tenderness palpation along the tendon of the ECU.  She is able to make a full fist.  Slightly restricted extension of flexion of the wrist.  Full pronation and supination.  Slightly decreased sensation to the index, long and volar thumb.  Fingers warm and well-perfused.  2+ radial pulse.  IMAGING: I personally ordered and reviewed the following images:  No new imaging obtained today.  Oliver Barre, MD 12/17/2020 12:46  PM

## 2020-12-18 ENCOUNTER — Other Ambulatory Visit: Payer: Self-pay

## 2020-12-18 ENCOUNTER — Ambulatory Visit (HOSPITAL_COMMUNITY)
Admission: RE | Admit: 2020-12-18 | Discharge: 2020-12-18 | Disposition: A | Discharge status: Home / Self Care | Payer: Medicare Other | Source: Ambulatory Visit | Attending: Women's Health | Admitting: Women's Health

## 2020-12-18 DIAGNOSIS — Z1231 Encounter for screening mammogram for malignant neoplasm of breast: Secondary | ICD-10-CM | POA: Diagnosis not present

## 2020-12-18 NOTE — Telephone Encounter (Signed)
Already been discussed with the pt

## 2020-12-23 NOTE — Telephone Encounter (Signed)
Referral sent to vascular via Epic.

## 2020-12-23 NOTE — Telephone Encounter (Signed)
Please refer vascular specialist for ongoing management/monitoring of the 50% blockage of your superior mesenteric artery. She is hesitant for CTA due to history of IV contrast allergy and would like to consider mesenteric doppler if appropriate.

## 2020-12-23 NOTE — Addendum Note (Signed)
Addended by: Corrie Mckusick on: 12/23/2020 08:22 AM   Modules accepted: Orders

## 2020-12-24 ENCOUNTER — Other Ambulatory Visit: Payer: Medicare Other | Admitting: Women's Health

## 2020-12-24 NOTE — Telephone Encounter (Signed)
Referral cancelled. 

## 2020-12-24 NOTE — Addendum Note (Signed)
Addended by: Armstead Peaks on: 12/24/2020 07:41 AM   Modules accepted: Orders

## 2021-01-01 ENCOUNTER — Telehealth: Payer: Self-pay | Admitting: Internal Medicine

## 2021-01-01 DIAGNOSIS — U071 COVID-19: Secondary | ICD-10-CM | POA: Diagnosis not present

## 2021-01-01 DIAGNOSIS — R509 Fever, unspecified: Secondary | ICD-10-CM | POA: Diagnosis not present

## 2021-01-01 DIAGNOSIS — R059 Cough, unspecified: Secondary | ICD-10-CM | POA: Diagnosis not present

## 2021-01-01 DIAGNOSIS — R110 Nausea: Secondary | ICD-10-CM | POA: Diagnosis not present

## 2021-01-02 NOTE — Telephone Encounter (Signed)
Spoke with the pt  She states that she is needing her last set of labs and her cxr results faxed to her PCP  I routed all of them to PCP via Epic  She states also wants to inform Dr Craige Cotta that she had rapid covid test done on 01/01/21 and it was pos  This was ordered by her PCP and they decided not to treat her with antiviral again since side effects last time  She is having nasal congestion and cough  Her PCP has given her recommendations and she will let us know if her symptoms worsen  Advised to seek emergent care if needed  Forwarding to Dr Craige Cotta as Lorain Childes

## 2021-01-02 NOTE — Telephone Encounter (Signed)
I have called and LM on VM for the pt to call us back.  

## 2021-01-02 NOTE — Telephone Encounter (Signed)
Noted  

## 2021-01-06 IMAGING — US US PELVIS COMPLETE
1 series · 13 of 25 positions shown · non-contrast
Comparison: CT abdomen pelvis dated July 26, 2019. Pelvic ultrasound
dated December 15, 2018.

CLINICAL DATA: Pelvic cramping for the past month. Right lower
quadrant lump.



[Series 1: us pelvis (transabdominal only) · 13 of 74 slices shown]
[im 1/74]
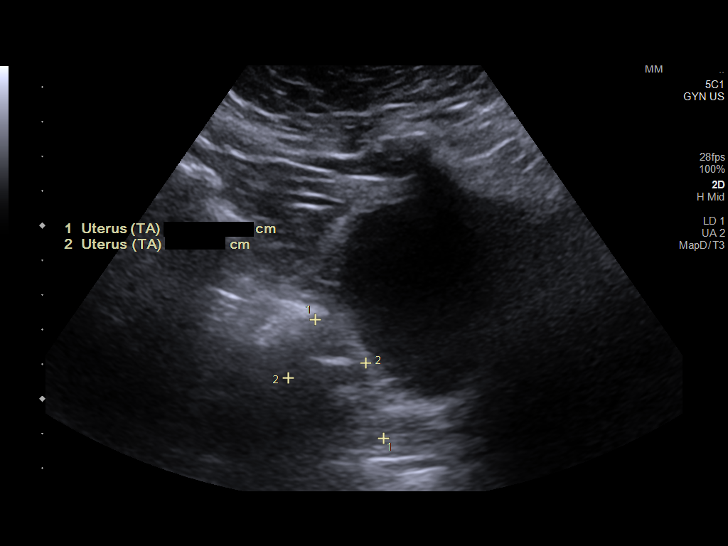
[im 7/74]
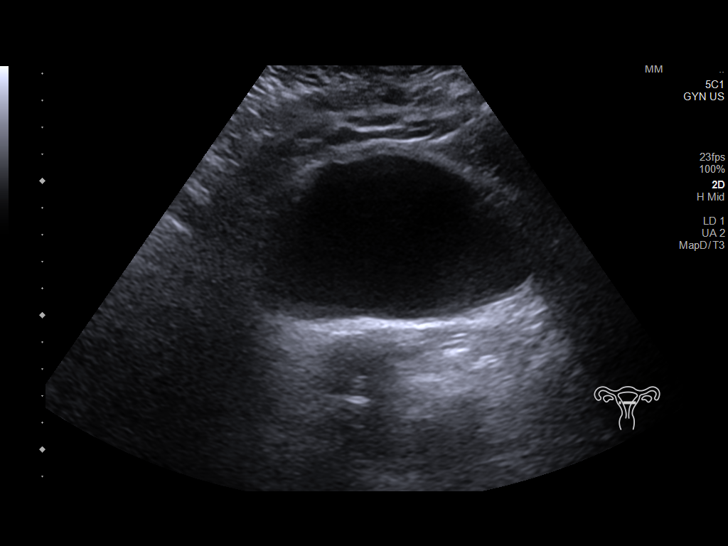
[im 13/74]
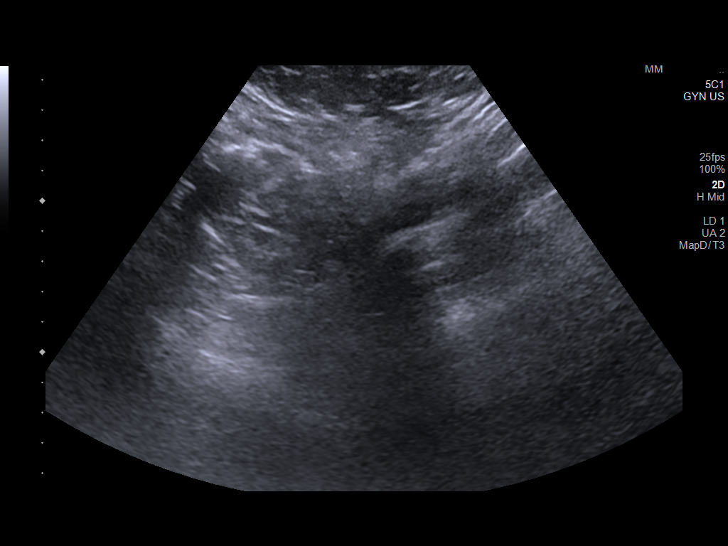
[im 19/74]
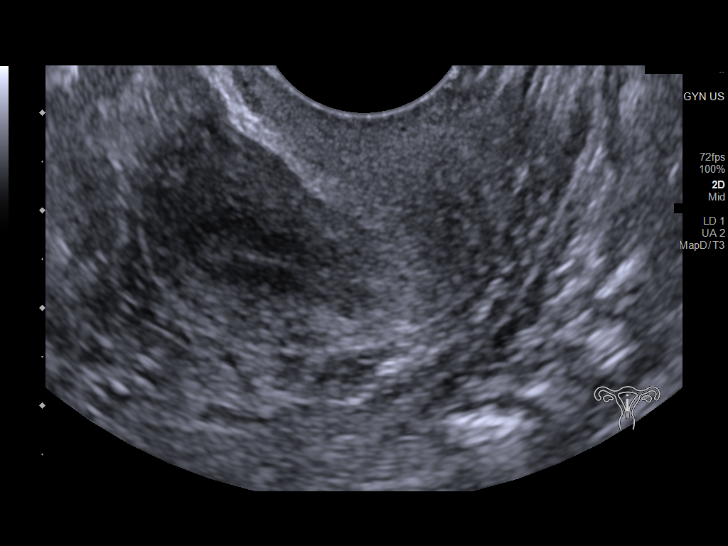
[im 25/74]
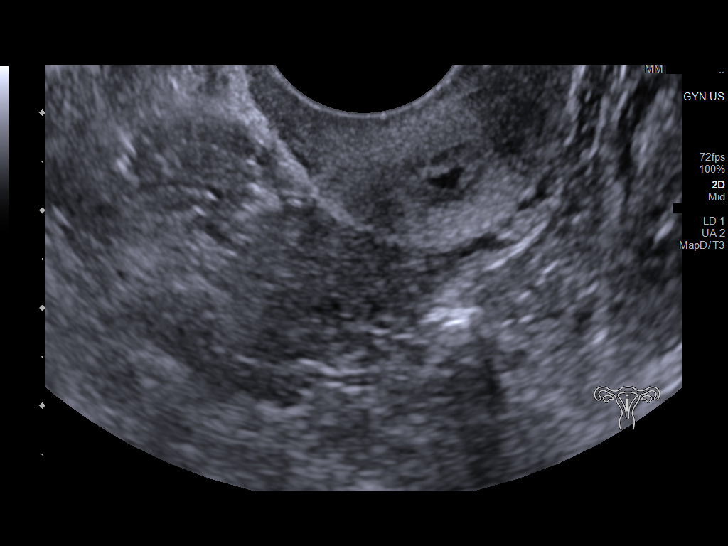
[im 31/74]
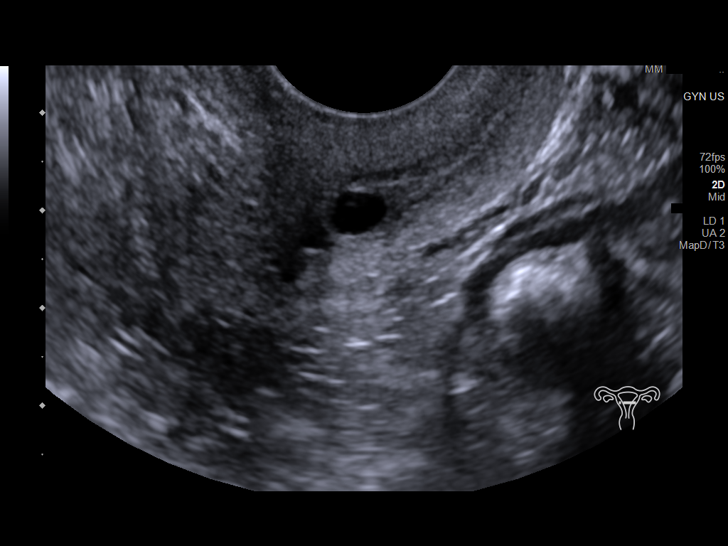
[im 37/74]
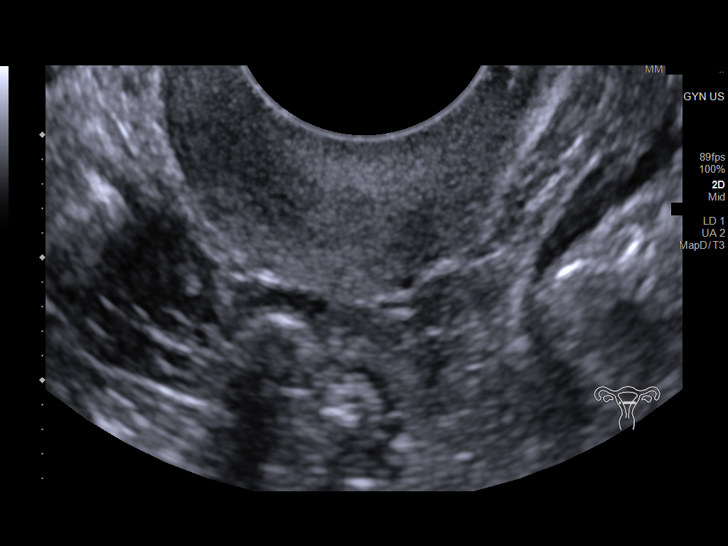
[im 43/74]
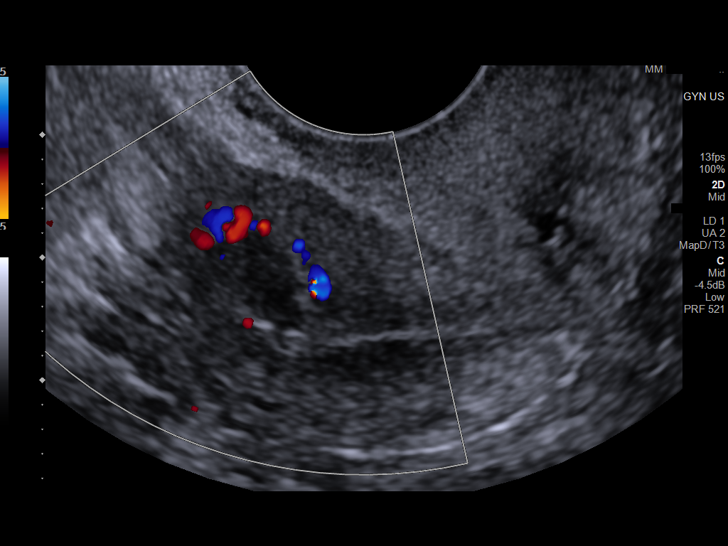
[im 49/74]
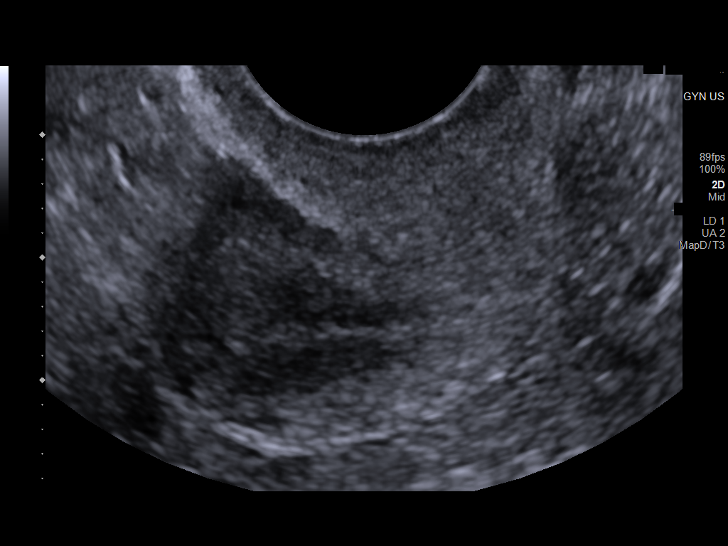
[im 55/74]
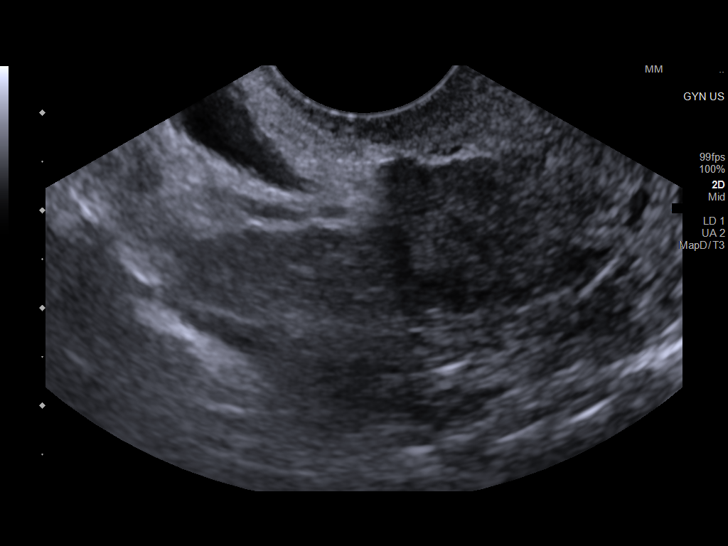
[im 61/74]
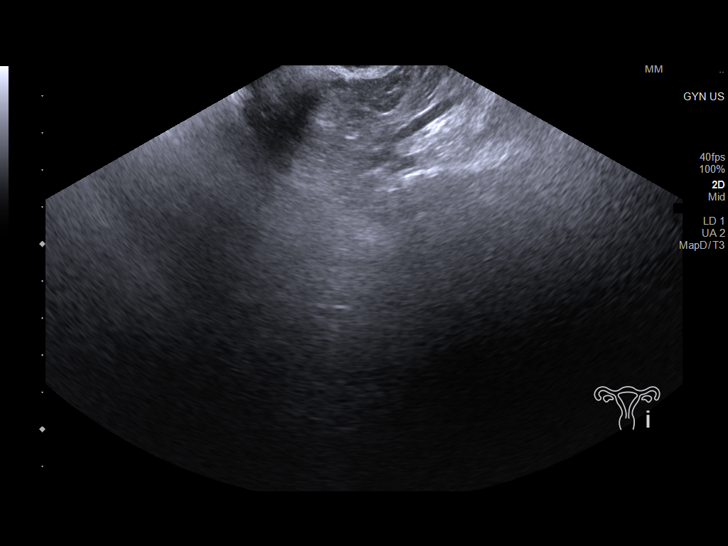
[im 67/74]
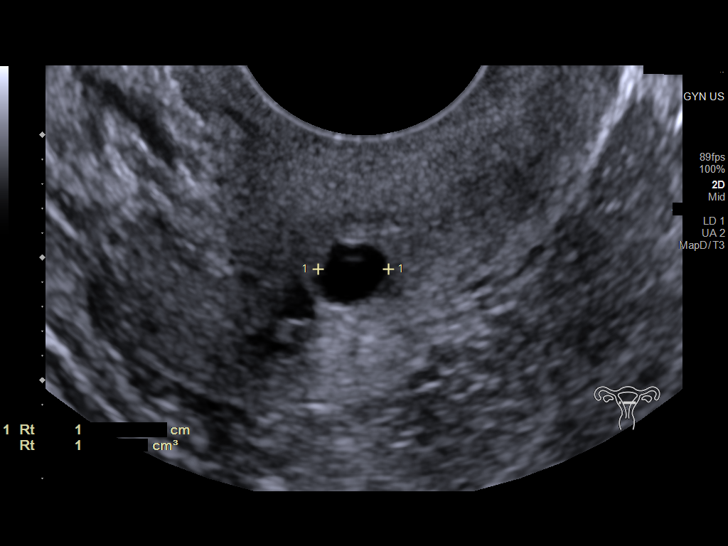
[im 74/74]
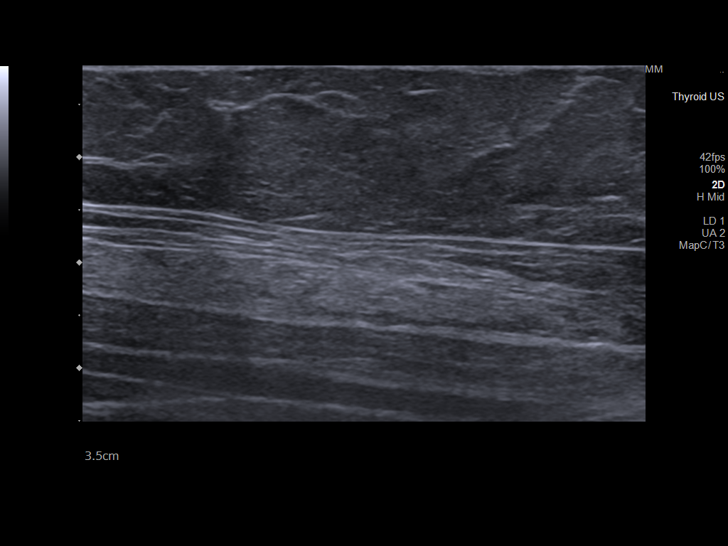

[13 of 25 positions shown; findings below may reference images not displayed]

FINDINGS: Uterus

Measurements: 4.0 x 1.9 x 3.4 cm = volume: 13 mL. Small 1.0 cm
intramural fibroid in the right fundus. 0.8 cm Nabothian cyst.

Endometrium

Thickness: 2 mm.  No focal abnormality visualized.

Right ovary

Measurements: 1.0 x 0.8 x 0.8 cm = volume: 0.3 mL. Normal
appearance/no adnexal mass.

Left ovary

Not visualized.

Other findings

No abnormal free fluid.

Focused ultrasound of the right lower quadrant near the right hip
were the patient feels a palpable abnormality demonstrates no
discrete soft tissue mass or fluid collection.
IMPRESSION: 1. No sonographic correlate for the patient's palpable abnormality
in the right lower quadrant.
2. Small uterine fibroid.

## 2021-01-06 IMAGING — US US TRANSVAGINAL NON-OB
1 series · 13 of 25 positions shown · non-contrast
Comparison: CT abdomen pelvis dated July 26, 2019. Pelvic ultrasound
dated December 15, 2018.

CLINICAL DATA: Pelvic cramping for the past month. Right lower
quadrant lump.



[Series 1: us pelvis (transabdominal only) · 13 of 74 slices shown]
[im 1/74]
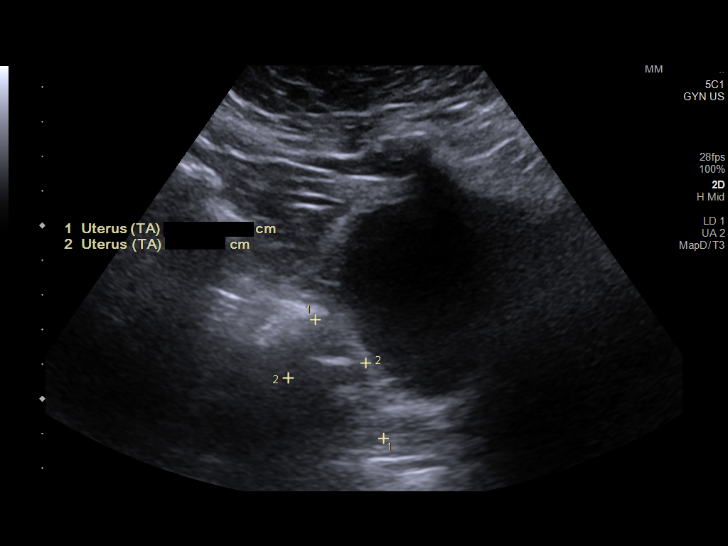
[im 7/74]
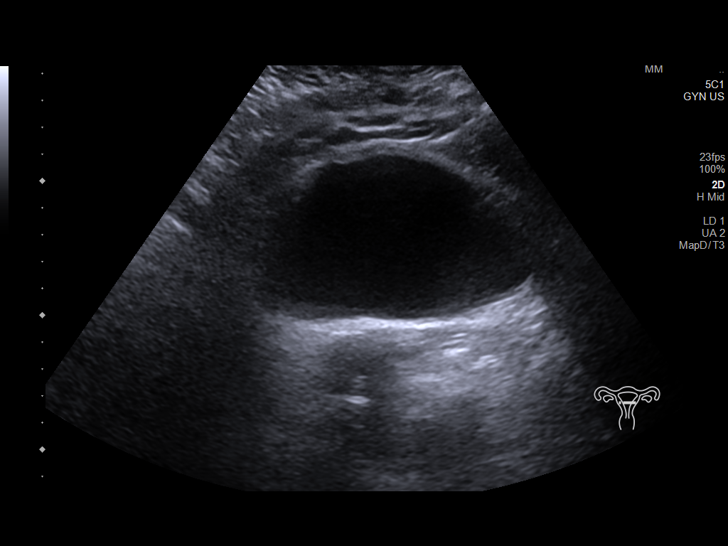
[im 13/74]
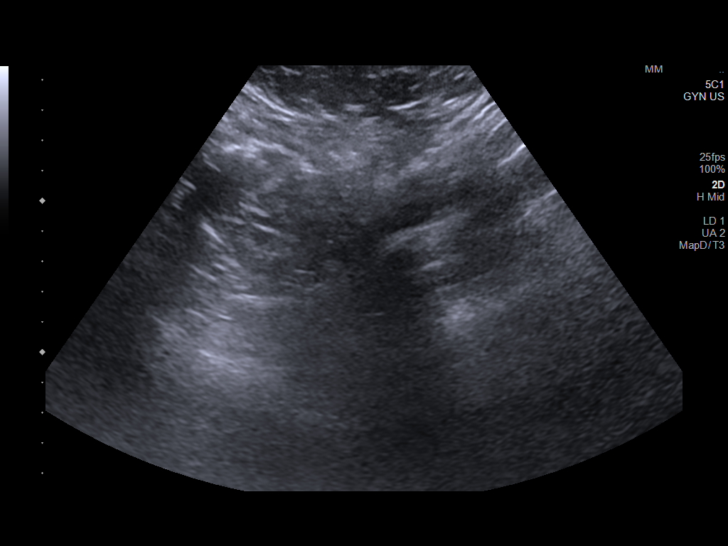
[im 19/74]
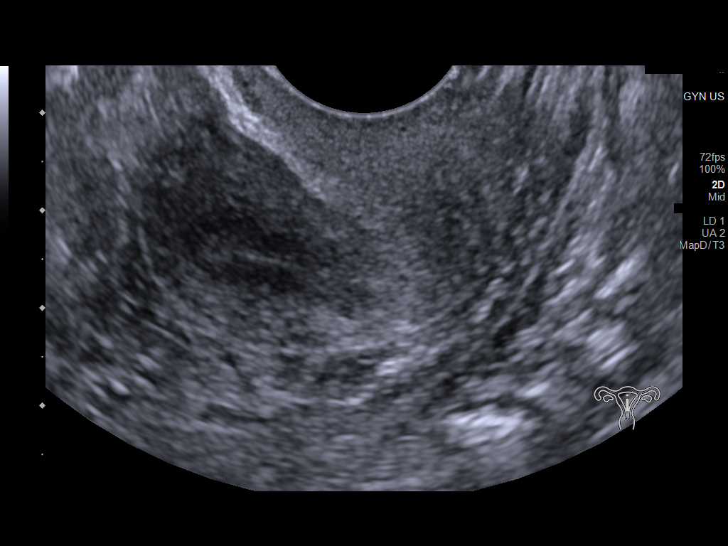
[im 25/74]
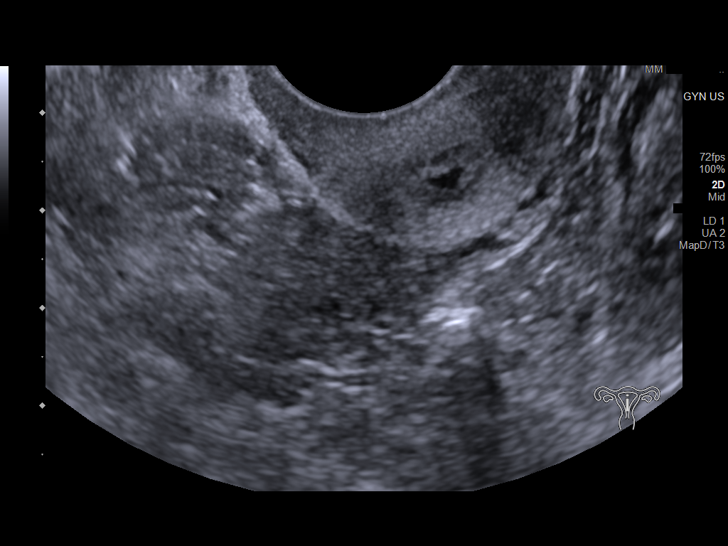
[im 31/74]
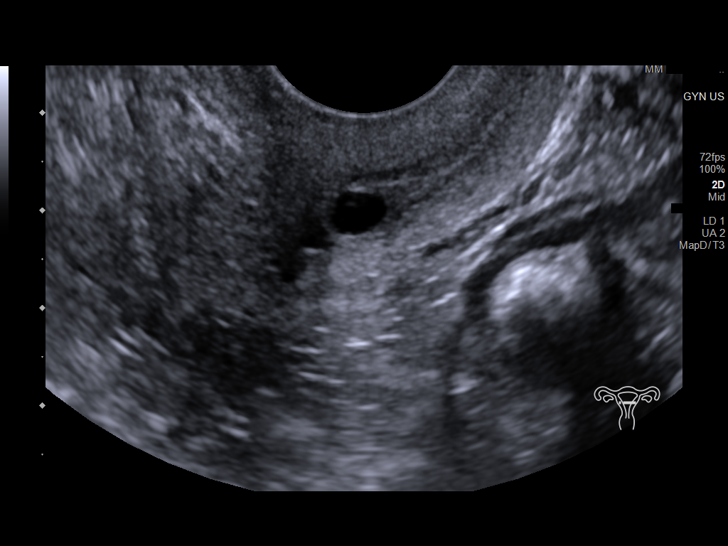
[im 37/74]
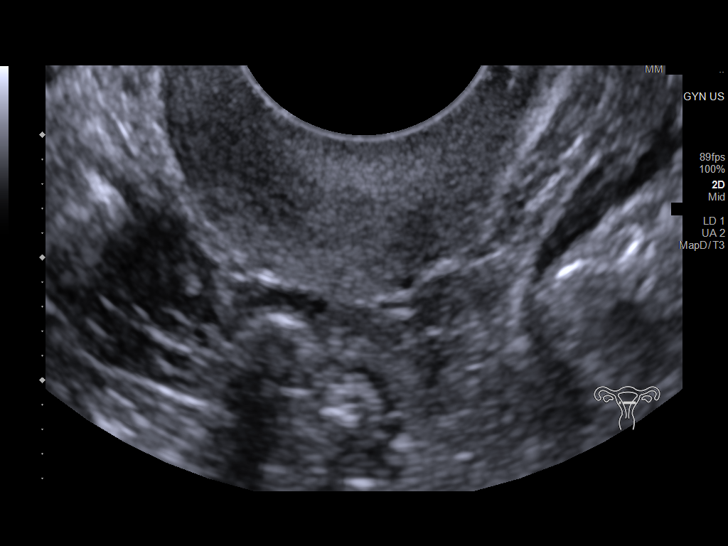
[im 43/74]
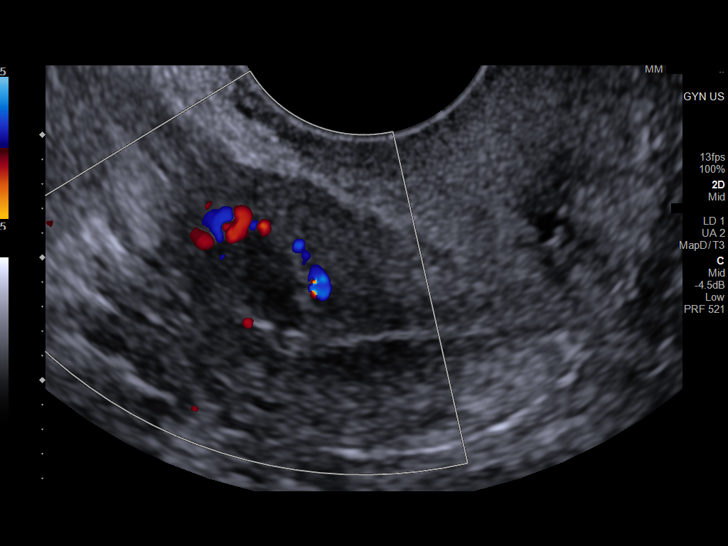
[im 49/74]
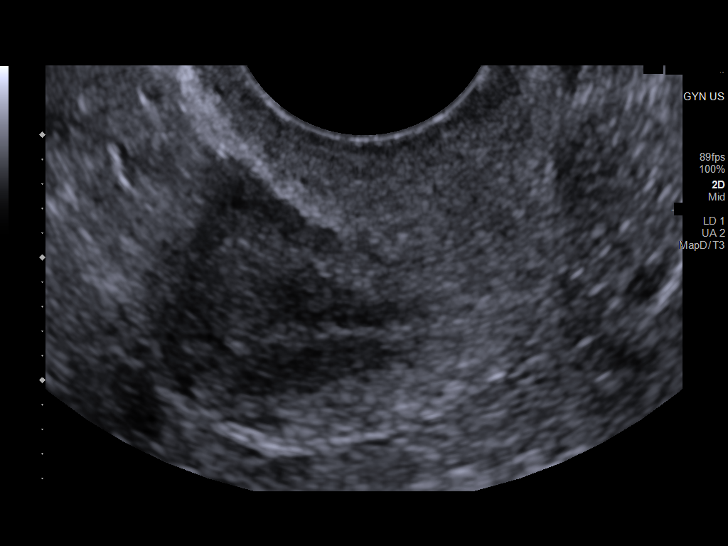
[im 55/74]
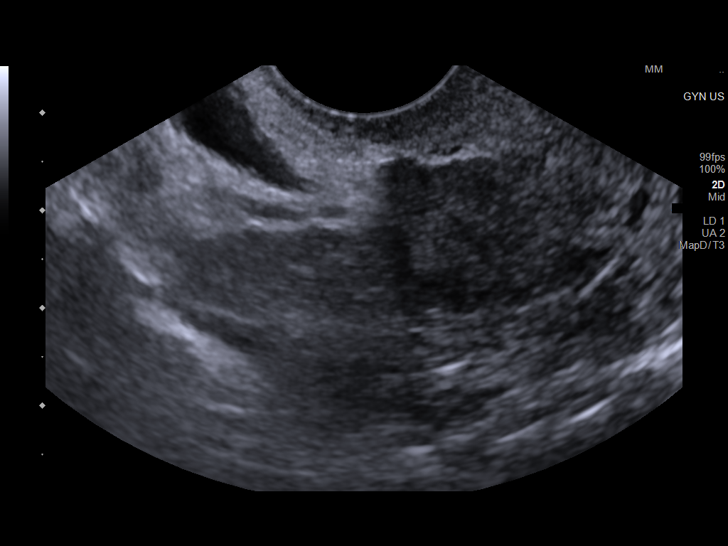
[im 61/74]
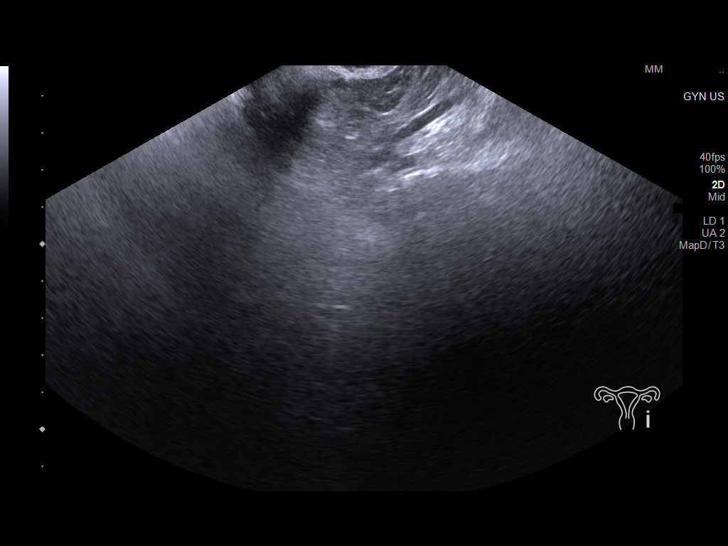
[im 67/74]
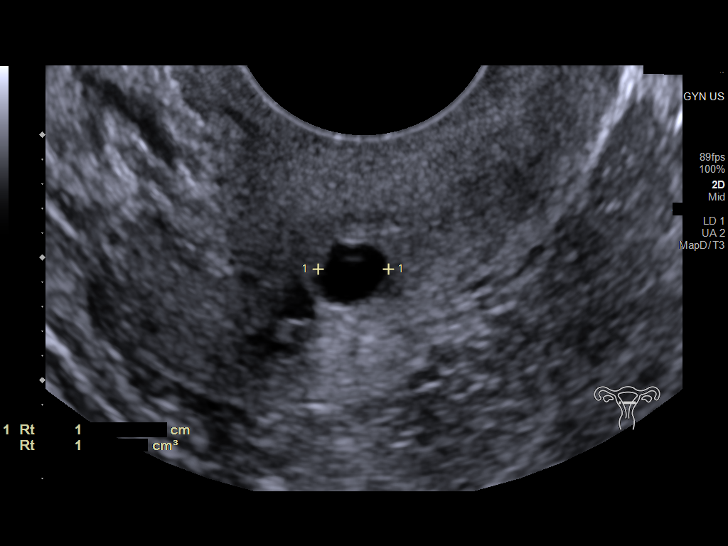
[im 74/74]
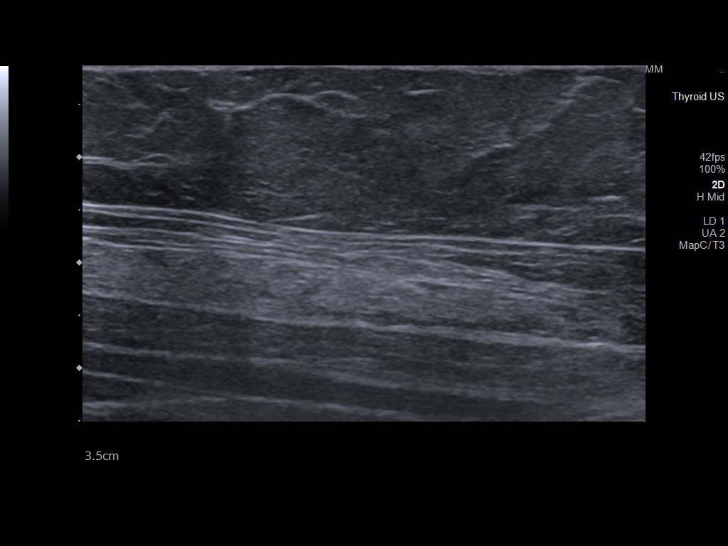

[13 of 25 positions shown; findings below may reference images not displayed]

FINDINGS: Uterus

Measurements: 4.0 x 1.9 x 3.4 cm = volume: 13 mL. Small 1.0 cm
intramural fibroid in the right fundus. 0.8 cm Nabothian cyst.

Endometrium

Thickness: 2 mm.  No focal abnormality visualized.

Right ovary

Measurements: 1.0 x 0.8 x 0.8 cm = volume: 0.3 mL. Normal
appearance/no adnexal mass.

Left ovary

Not visualized.

Other findings

No abnormal free fluid.

Focused ultrasound of the right lower quadrant near the right hip
were the patient feels a palpable abnormality demonstrates no
discrete soft tissue mass or fluid collection.
IMPRESSION: 1. No sonographic correlate for the patient's palpable abnormality
in the right lower quadrant.
2. Small uterine fibroid.

## 2021-01-10 ENCOUNTER — Ambulatory Visit: Payer: Medicare Other | Admitting: Cardiology

## 2021-01-16 DIAGNOSIS — E039 Hypothyroidism, unspecified: Secondary | ICD-10-CM | POA: Diagnosis not present

## 2021-01-17 ENCOUNTER — Ambulatory Visit: Payer: Medicare Other | Admitting: Orthopedic Surgery

## 2021-01-20 DIAGNOSIS — E039 Hypothyroidism, unspecified: Secondary | ICD-10-CM | POA: Diagnosis not present

## 2021-01-20 DIAGNOSIS — G43909 Migraine, unspecified, not intractable, without status migrainosus: Secondary | ICD-10-CM | POA: Diagnosis not present

## 2021-01-20 DIAGNOSIS — R0600 Dyspnea, unspecified: Secondary | ICD-10-CM | POA: Diagnosis not present

## 2021-01-20 DIAGNOSIS — I10 Essential (primary) hypertension: Secondary | ICD-10-CM | POA: Diagnosis not present

## 2021-01-23 ENCOUNTER — Other Ambulatory Visit: Payer: Medicare Other | Admitting: Women's Health

## 2021-01-24 ENCOUNTER — Other Ambulatory Visit (HOSPITAL_COMMUNITY): Payer: Medicare Other

## 2021-01-28 ENCOUNTER — Encounter (HOSPITAL_COMMUNITY): Payer: Medicare Other

## 2021-01-28 ENCOUNTER — Ambulatory Visit: Payer: Medicare Other | Admitting: Pulmonary Disease

## 2021-01-29 ENCOUNTER — Ambulatory Visit: Payer: Medicare Other | Admitting: Orthopedic Surgery

## 2021-01-29 NOTE — Progress Notes (Signed)
Referring Provider: Benita StabileHall, John Z, MD Primary Care Physician:  Benita StabileHall, John Z, MD Primary GI Physician: Dr. Marletta Lorarver  Chief Complaint  Patient presents with   Constipation    Linzess helps sometimes since covid.    Diarrhea   Abdominal Pain    Right and left   Gastroesophageal Reflux    HPI:   Katie Davis is a 65 y.o. female presenting today for follow-up.  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  Colonoscopy up-to-date March 2022 revealing diverticulosis and nonbleeding internal hemorrhoids, due for repeat in 2032.  Last seen in our office 12/10/2020.  Reported she had been doing fairly well until she developed COVID in August.  Stated this flared many of her GI symptoms.  Had previously been taking Linzess 72-145 mcg daily for constipation.  With COVID, she developed significant postprandial diarrhea and stopped Linzess.  Also with poor appetite.  Had loss a total of 12 pounds though her weight had finally stabilized.  Night prior to her office visit, she was able to eat chicken and brown rice without diarrhea.  She continued to avoid gluten.  Abdominal pain has been stable, sometimes with discomfort associated with gas pockets which was a chronic symptom for her.  She also reported baseline of chronic nausea, which she attributed to the artery blockage in her abdomen.  Reported [place] was [place] in [place] and advised to have yearly CTs.  She also reported allergy to IV contrast, but tolerated IV contrast in April 2019 at the time of her last CTA with pre-medication.  Plan to monitor for any persistent diarrhea, suspect that she would return to baseline.  Also plan to update labs to evaluate for nutritional deficiencies and refer to vascular for surveillance of SMA  stenosis.  Labs completed: Iron panel with ferritin elevated at 160, otherwise within normal limits.  B12 and folate within normal.  Vitamin D within normal limits.  TTG IgA < 2.  Several communications in the chart through patient message about ongoing abdominal issues and recurrent COVID infections.  Also reported her thyroid "bottomed out" and return of reflux.  Recommended office visit to further evaluate.  Today:  Had COVID a total of 3 times. Has been on several rounds of antibiotics.  Since then, seems that she cannot get her "gut" right.  All of her GI symptoms have been flared.  States she last felt normal in March.  Regarding COVID symptoms, she has mild residual cough that is improving.  Also with some shortness of breath with exertion.  Regarding her bowel habits, she reports having 2 mushy-loose bowel movements per day when taking Linzess.  She takes at least once Linzess 72 mcg daily, and sometimes 2.  Sometimes, she skips days between bowel movements.  Feels that she is still backed up.  Denies BRBPR, melena, oily or greasy stools.  No nocturnal stools.  Feels bloated.  Also with left and right sided abdominal pain.  If she has a bowel movement, her right-sided pain improves, but her left-sided abdominal pain is not improving.  She has history of diverticulitis and feels that she may have diverticulitis now.  Left-sided pain is persistent and 4-08/2008 in severity.  No aggravating or relieving factors.  Started a couple weeks ago.   Chronic nausea without vomiting.  Feels queasy.  Also with early satiety since having COVID.  Denies weight loss.  If constipated, nausea worsens.  GERD symptoms 2 to 3 days a week when having increased cough.  Denies dysphagia.  Took Nexium for couple of weeks about a month ago which controls GERD symptoms well.  This also helped her nausea a little.  Reports she had 6 attacks of gastritis since having COVID.  She took Alka-Seltzer for this.  She also takes  ibuprofen and Advil.  She took this more frequently when she had COVID, otherwise she takes this about 3 days a week.  Denies history of PUD or H. pylori.  She does have a lot of stress.   Reports her thyroid function dropped to 0.8 recently.  Her medication was changed to 5 days a week from 7 days a week.  She was having heart palpitations and shakiness, but this is now improving.   ?EGD- if nausea continues.  Linzess 145, Protonix 40 mg daily ?CT-  Limit NSAIDs.  Past Medical History:  Diagnosis Date   Abnormal glandular Papanicolaou smear of cervix    Anxiety    Asthma    Body mass index 30.0-30.9, adult    Celiac disease    Chronic vascular disorder of intestine (HCC)    Fibromyalgia    Graves disease    Headache    Migrains   Hyperlipidemia    Hypertension    Hypothyroidism    IBS (irritable bowel syndrome) With constipation    Lupus (systemic lupus erythematosus) (HCC)    Obesity, unspecified    Osteoporosis    Raynaud disease    Sjogren's disease (HCC)     Past Surgical History:  Procedure Laterality Date   APPENDECTOMY     CHOLECYSTECTOMY     COLONOSCOPY WITH PROPOFOL N/A 06/10/2020   Surgeon: Lanelle Bal, DO;  diverticulosis and nonbleeding internal hemorrhoids, due for repeat in 2032.   OTHER SURGICAL HISTORY     biopsies on both feet   REDUCTION MAMMAPLASTY      Current Outpatient Medications  Medication Sig Dispense Refill   acidophilus (RISAQUAD) CAPS capsule Take 1 capsule by mouth daily.     albuterol (VENTOLIN HFA) 108 (90 Base) MCG/ACT inhaler Inhale 1-2 puffs into the lungs every 6 (six) hours as needed for wheezing or shortness of breath.     atenolol (TENORMIN) 50 MG tablet Take 50 mg by mouth 2 (two) times daily.      calcium carbonate (TUMS - DOSED IN MG ELEMENTAL CALCIUM) 500 MG chewable tablet Chew 1 tablet by mouth daily.     Cholecalciferol (VITAMIN D3 GUMMIES) 25 MCG (1000 UT) CHEW Chew 6,000 Units by mouth daily.     clonazePAM  (KLONOPIN) 0.5 MG tablet Take 0.25-0.5 mg by mouth 2 (two) times daily as needed for anxiety.      cyclobenzaprine (FLEXERIL) 10 MG tablet Take 10 mg by mouth 3 (three) times daily as needed for muscle spasms.      ELDERBERRY PO Take 1 capsule by mouth daily.     fluorometholone (FML) 0.1 % ophthalmic suspension Place 1 drop into both eyes in the morning and at bedtime.     ibuprofen (ADVIL,MOTRIN) 800 MG tablet Take 800 mg by mouth every 8 (eight) hours as needed for moderate pain.     linaclotide (LINZESS) 145 MCG CAPS capsule Take 1 capsule (145 mcg total) by mouth daily before breakfast. 30 capsule 3   meclizine (ANTIVERT) 12.5 MG tablet Take  12.5-25 mg by mouth every 6 (six) hours as needed for dizziness.      ondansetron (ZOFRAN) 4 MG tablet Take 1 tablet (4 mg total) by mouth every 8 (eight) hours as needed for nausea or vomiting. 12 tablet 0   oxyCODONE-acetaminophen (PERCOCET/ROXICET) 5-325 MG tablet Take 1 tablet by mouth 2 (two) times daily as needed for moderate pain or severe pain.      pantoprazole (PROTONIX) 40 MG tablet Take 1 tablet (40 mg total) by mouth daily before breakfast. 30 tablet 3   Polyethyl Glycol-Propyl Glycol (SYSTANE OP) Place 1 drop into both eyes daily.     RESTASIS 0.05 % ophthalmic emulsion Place 1 drop into both eyes 2 (two) times daily.     rosuvastatin (CRESTOR) 5 MG tablet Take 5 mg by mouth 4 (four) times a week.     SYNTHROID 112 MCG tablet Take 112 mcg by mouth. Takes 5 days a week.     No current facility-administered medications for this visit.    Allergies as of 01/30/2021 - Review Complete 01/30/2021  Allergen Reaction Noted   Prednisone Palpitations 12/17/2020   Bactrim [sulfamethoxazole-trimethoprim] Nausea And Vomiting 11/16/2018   Doxycycline  05/07/2020   Gluten meal  11/16/2018   Keflex [cephalexin]  12/09/2018   Latex Hives 11/16/2018   Levaquin [levofloxacin] Nausea And Vomiting 11/16/2018   Sulfa antibiotics Nausea And Vomiting  11/16/2018   Contrast media [iodinated diagnostic agents] Nausea And Vomiting and Rash 11/16/2018    Family History  Problem Relation Age of Onset   Congestive Heart Failure Mother    Aneurysm Father    Celiac disease Brother    Melanoma Brother    Aneurysm Brother        stomach   Stroke Sister    Celiac disease Sister    Irritable bowel syndrome Sister    Celiac disease Brother    Aneurysm Sister    Other Sister        multiple back surgeries   Colon cancer Neg Hx     Social History   Socioeconomic History   Marital status: Married    Spouse name: Not on file   Number of children: Not on file   Years of education: Not on file   Highest education level: Not on file  Occupational History   Not on file  Tobacco Use   Smoking status: Never   Smokeless tobacco: Never  Vaping Use   Vaping Use: Never used  Substance and Sexual Activity   Alcohol use: Not Currently    Comment: wine occ   Drug use: Never   Sexual activity: Not Currently    Birth control/protection: Post-menopausal  Other Topics Concern   Not on file  Social History Narrative   Not on file   Social Determinants of Health   Financial Resource Strain: Low Risk    Difficulty of Paying Living Expenses: Not hard at all  Food Insecurity: No Food Insecurity   Worried About Charity fundraiser in the Last Year: Never true   Arboriculturist in the Last Year: Never true  Transportation Needs: No Transportation Needs   Lack of Transportation (Medical): No   Lack of Transportation (Non-Medical): No  Physical Activity: Insufficiently Active   Days of Exercise per Week: 2 days   Minutes of Exercise per Session: 10 min  Stress: Stress Concern Present   Feeling of Stress : To some extent  Social Connections: Moderately Integrated   Frequency of Communication  with Friends and Family: Three times a week   Frequency of Social Gatherings with Friends and Family: Once a week   Attends Religious Services: More  than 4 times per year   Active Member of [employer] or Organizations: No   Attends Archivist Meetings: Never   Marital Status: Married    Review of Systems: Gen: Denies fever, chills, anorexia. Denies fatigue, weakness, weight loss.  CV: Denies chest pain, palpitations, syncope, peripheral edema, and claudication. Resp: Denies dyspnea at rest, cough, wheezing, coughing up blood, and pleurisy. GI: Denies vomiting blood, jaundice, and fecal incontinence.   Denies dysphagia or odynophagia. Derm: Denies rash, itching, dry skin Psych: Denies depression, anxiety, memory loss, confusion. No homicidal or suicidal ideation.  Heme: Denies bruising, bleeding, and enlarged lymph nodes.  Physical Exam: BP 128/84   Pulse 68   Temp (!) 96.9 F (36.1 C) (Temporal)   Ht 5\' 3"  (1.6 m)   Wt 173 lb 9.6 oz (78.7 kg)   BMI 30.75 kg/m  General:   Alert and oriented. No distress noted. Pleasant and cooperative.  Head:  Normocephalic and atraumatic. Eyes:  Conjuctiva clear without scleral icterus. Mouth:  Oral mucosa pink and moist. Good dentition. No lesions. Heart:  S1, S2 present without murmurs appreciated. Lungs:  Clear to auscultation bilaterally. No wheezes, rales, or rhonchi. No distress.  Abdomen:  +BS, soft, and non-distended. Moderate TTP in LUQ, epigastric, and RUQ region, mild TTP in LLQ and RLQ. No rebound or guarding. No HSM or masses noted. Msk:  Symmetrical without gross deformities. Normal posture. Extremities:  Without edema. Neurologic:  Alert and  oriented x4 Psych:  Normal mood and affect.    Assessment:  65 y.o. female with history of IBS, diverticulosis, celiac disease (biopsy confirmed), 50% blockage of SMA, presenting today with multiple GI complaints including abdominal pain, alternating bowel habits, GERD, nausea without vomiting, early satiety, and bloating, all of which has flared/worsened since having pneumonia then COVID x3 since April.   Abdominal Pain:   Complains of right and left-sided abdominal pain x2 weeks.  Right-sided pain improves with a bowel movement, but left-sided pain is persistent. She does have nausea though this is chronic and without vomiting, also with GERD 2-3 days a week, not on a PPI. No fever, chills, brbpr, melena, or weight loss. Following strict gluten free diet. Admits to taking Alka-Seltzer recently as well as ibuprofen and Advil a few days a week.  On exam, she has generalized tenderness to palpation with moderate tenderness in LUQ, epigastric, and RUQ region, mild TTP in LLQ and RLQ.  Reports history of diverticulitis and feels her left-sided pain is similar to this.  History of cholecystectomy and appendectomy.   Symptoms likely multifactorial in the setting of GERD, gastritis, IBS, cannot rule out PUD, H. pylori, diverticulitis, and/or pancreatitis.  We will start her on a PPI daily, increase Linzess to 145 mcg daily, check labs, and as patient feels symptoms are similar to diverticulitis, will obtain CT with oral contrast only (IV contrast allergy). I did confirm with CT tech that oral contrast is gluten-free.   Alternating constipation and diarrhea: History of IBS-C.  Currently on Linzess and taking 1-2, 72 mcg capsules daily.  With 72 mcg daily, she will skip days between bowel movements at times, associated with bloating and nausea.  When she takes two Linzess, she will have looser stools/some watery stools, but feels better.  Discussed that with taking higher doses of Linzess intermittently, she would likely  continue to have intermittent diarrhea, but if she were to take the higher dose consistently, the diarrhea will likely taper off and she should have better relief of her bloating.    Chronic nausea without vomiting:  Query GERD vs gastritis related in the setting of chronic NSAID use. Can't rule out PUD, H. Pylori, gastroparesis. As she is already taking tums every night, we will hold off on H. Pylori breath test.   We will plan to start her on daily PPI, follow a GERD/gastroparesis diet, and limit NSAIDs as much as possible.  If persistent symptoms, would recommend EGD.  GERD:  Uncontrolled.  2 to 3 days/week.  Worsened by cough since having COVID.  Denies dysphagia.  We will start daily PPI.  Reinforced GERD diet/lifestyle.  Early satiety:  New since COVID x 3. No unintentional weight loss. Query post infectious gastroparesis vs related to uncontrolled GERD/?gastritis and IBS. As she has multiple complaints today, we will start with better management of GERD and bowel habits as per above and see if this also helps with her early satiety. May need to consider EGD in the future if symptoms persist.    Plan:  CT abdomen and pelvis with oral contrast only in light of IV contrast allergy.  CBC, CMP, Lipase Increase Linzess to 145 mcg daily. Start Protonix 40 mg daily 30 minutes before breakfast.  Avoid fried, fatty, greasy, spicy foods, carbonated beverages, and caffeine. Follow a low-fat/low fiber diet. Eat 4-6 small meals daily. Limit NSAIDs as much as possible. Recommended using tylenol first.  Keep follow-up in February as scheduled.   Katie Memos, PA-C [facility] 01/30/2021

## 2021-01-30 ENCOUNTER — Encounter: Payer: Self-pay | Admitting: *Deleted

## 2021-01-30 ENCOUNTER — Telehealth: Payer: Self-pay | Admitting: Internal Medicine

## 2021-01-30 ENCOUNTER — Other Ambulatory Visit: Payer: Self-pay

## 2021-01-30 ENCOUNTER — Ambulatory Visit: Payer: Medicare Other | Admitting: Gastroenterology

## 2021-01-30 ENCOUNTER — Telehealth: Payer: Self-pay | Admitting: *Deleted

## 2021-01-30 ENCOUNTER — Encounter: Payer: Self-pay | Admitting: Gastroenterology

## 2021-01-30 VITALS — BP 128/84 | HR 68 | Temp 96.9°F | Ht 63.0 in | Wt 173.6 lb

## 2021-01-30 DIAGNOSIS — R1084 Generalized abdominal pain: Secondary | ICD-10-CM

## 2021-01-30 DIAGNOSIS — R6881 Early satiety: Secondary | ICD-10-CM | POA: Insufficient documentation

## 2021-01-30 DIAGNOSIS — R198 Other specified symptoms and signs involving the digestive system and abdomen: Secondary | ICD-10-CM | POA: Diagnosis not present

## 2021-01-30 DIAGNOSIS — K219 Gastro-esophageal reflux disease without esophagitis: Secondary | ICD-10-CM | POA: Insufficient documentation

## 2021-01-30 DIAGNOSIS — R140 Abdominal distension (gaseous): Secondary | ICD-10-CM | POA: Insufficient documentation

## 2021-01-30 DIAGNOSIS — R110 Nausea: Secondary | ICD-10-CM

## 2021-01-30 MED ORDER — LINACLOTIDE 145 MCG PO CAPS: 145.0000 ug | ORAL_CAPSULE | Freq: Every day | ORAL | 3 refills | Status: DC

## 2021-01-30 MED ORDER — PANTOPRAZOLE SODIUM 40 MG PO TBEC
40.0000 mg | DELAYED_RELEASE_TABLET | Freq: Every day | ORAL | 3 refills | Status: DC
Start: 2021-01-30 — End: 2022-02-18

## 2021-01-30 NOTE — Telephone Encounter (Signed)
PA approved via [employer] website. Medicare Decision Support Number: [id number] [place] [phone]

## 2021-01-30 NOTE — Patient Instructions (Addendum)
We will arrange for you to have a CT of your abdomen and pelvis at [facility] with oral contrast only.  I would also like for you to have some blood work completed.  You may complete this at [facility].  To help with reflux and nausea: Start Protonix 40 mg daily 30 minutes before breakfast. Avoid fried, fatty, greasy, spicy foods. Avoid carbonated beverages and caffeine. For now, also recommend he follow a low-fat/low fiber diet as this can delay stomach emptying and worsening nausea. Try eating 4-6 small meals per day.  For alternating constipation and diarrhea/bloating: Increase Linzess to 145 mcg daily.  You may finish out her current prescription by taking 2 of the 72 Linzess mcg capsules together, then start the new prescription I have sent to your pharmacy. You may have diarrhea for a couple of weeks, but this should taper off as your body adjusts to the medication.  We will keep your follow-up appointment as scheduled with Verlon Au in February.  Please call with any questions or concerns prior to your next visit.   It was a pleasure meeting you today!  I hope you have a very happy Thanksgiving!  Ermalinda Memos, PA-C [facility]

## 2021-01-30 NOTE — Telephone Encounter (Signed)
[phone] PLEASE CALL PATIENT, SHE SAID THAT HER PROCEDURE IS STILL AS A DIAGNOSTIC AND IT WAS SUPPOSED TO BE A SCREENING.  SAID SHE HAS HAD IBS FOR YEARS AND THAT WAS NOT THE REASON FOR HER VISIT, IT WAS FOR HER SCREENING COLONOSCOPY

## 2021-02-03 ENCOUNTER — Ambulatory Visit (HOSPITAL_COMMUNITY)
Admission: RE | Admit: 2021-02-03 | Discharge: 2021-02-03 | Disposition: A | Discharge status: Home / Self Care | Payer: Medicare Other | Source: Ambulatory Visit | Attending: Gastroenterology | Admitting: Gastroenterology

## 2021-02-03 ENCOUNTER — Ambulatory Visit: Payer: Medicare Other | Admitting: Cardiology

## 2021-02-03 ENCOUNTER — Other Ambulatory Visit: Payer: Self-pay

## 2021-02-03 DIAGNOSIS — K5730 Diverticulosis of large intestine without perforation or abscess without bleeding: Secondary | ICD-10-CM | POA: Diagnosis not present

## 2021-02-03 DIAGNOSIS — R110 Nausea: Secondary | ICD-10-CM | POA: Diagnosis not present

## 2021-02-03 DIAGNOSIS — R140 Abdominal distension (gaseous): Secondary | ICD-10-CM | POA: Insufficient documentation

## 2021-02-03 DIAGNOSIS — K3189 Other diseases of stomach and duodenum: Secondary | ICD-10-CM | POA: Diagnosis not present

## 2021-02-03 DIAGNOSIS — R1084 Generalized abdominal pain: Secondary | ICD-10-CM | POA: Diagnosis not present

## 2021-02-03 DIAGNOSIS — R198 Other specified symptoms and signs involving the digestive system and abdomen: Secondary | ICD-10-CM | POA: Diagnosis not present

## 2021-02-03 DIAGNOSIS — K5900 Constipation, unspecified: Secondary | ICD-10-CM | POA: Diagnosis not present

## 2021-02-03 DIAGNOSIS — R197 Diarrhea, unspecified: Secondary | ICD-10-CM | POA: Diagnosis not present

## 2021-02-11 ENCOUNTER — Other Ambulatory Visit: Payer: Self-pay

## 2021-02-11 ENCOUNTER — Encounter: Payer: Self-pay | Admitting: Orthopedic Surgery

## 2021-02-11 ENCOUNTER — Ambulatory Visit (INDEPENDENT_AMBULATORY_CARE_PROVIDER_SITE_OTHER): Payer: Medicare Other | Admitting: Orthopedic Surgery

## 2021-02-11 VITALS — BP 138/89 | HR 76 | Ht 63.0 in | Wt 175.4 lb

## 2021-02-11 DIAGNOSIS — I10 Essential (primary) hypertension: Secondary | ICD-10-CM | POA: Diagnosis not present

## 2021-02-11 DIAGNOSIS — E039 Hypothyroidism, unspecified: Secondary | ICD-10-CM | POA: Diagnosis not present

## 2021-02-11 DIAGNOSIS — S46912A Strain of unspecified muscle, fascia and tendon at shoulder and upper arm level, left arm, initial encounter: Secondary | ICD-10-CM

## 2021-02-11 DIAGNOSIS — E559 Vitamin D deficiency, unspecified: Secondary | ICD-10-CM | POA: Diagnosis not present

## 2021-02-11 DIAGNOSIS — R7303 Prediabetes: Secondary | ICD-10-CM | POA: Diagnosis not present

## 2021-02-11 MED ORDER — DICLOFENAC-MISOPROSTOL 50-0.2 MG PO TBEC: 1.0000 | DELAYED_RELEASE_TABLET | Freq: Two times a day (BID) | ORAL | 0 refills | Status: DC
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
## 2021-02-11 NOTE — Patient Instructions (Signed)
Physical therapy for your shoulder  Voltaren gel at spot of maximum tenderness  Call if you have any further issues

## 2021-02-12 ENCOUNTER — Telehealth: Payer: Self-pay | Admitting: Gastroenterology

## 2021-02-12 NOTE — Telephone Encounter (Signed)
PATIENT WANTED TO CALL AND TELL YOU THAT SHE THINKS THE REASON HER STOMACH WAS BOTHERING HER IS BECAUSE HER THYROID IS SEVERELY OVERACTIVE

## 2021-02-12 NOTE — Telephone Encounter (Signed)
Noted. Patient also sent me a my chart message. I have responded through my chart.

## 2021-02-13 ENCOUNTER — Other Ambulatory Visit: Payer: Self-pay | Admitting: Family Medicine

## 2021-02-13 ENCOUNTER — Ambulatory Visit (HOSPITAL_COMMUNITY): Payer: Medicare Other | Admitting: Occupational Therapy

## 2021-02-13 ENCOUNTER — Other Ambulatory Visit (HOSPITAL_COMMUNITY): Payer: Self-pay | Admitting: Family Medicine

## 2021-02-13 DIAGNOSIS — L659 Nonscarring hair loss, unspecified: Secondary | ICD-10-CM | POA: Diagnosis not present

## 2021-02-13 DIAGNOSIS — R0981 Nasal congestion: Secondary | ICD-10-CM | POA: Diagnosis not present

## 2021-02-13 DIAGNOSIS — R059 Cough, unspecified: Secondary | ICD-10-CM | POA: Diagnosis not present

## 2021-02-13 DIAGNOSIS — E039 Hypothyroidism, unspecified: Secondary | ICD-10-CM

## 2021-02-16 ENCOUNTER — Telehealth: Payer: Self-pay | Admitting: Gastroenterology

## 2021-02-16 NOTE — Telephone Encounter (Signed)
Received labs from PCP dated 02/11/2021. TSH: 21.3 (H). T4, free: 0.82 CBC: WBC 4.8, hemoglobin 13.8, hematocrit 41.5, MCV 94, MCH 31.2, MCHC 33.3, platelets 190 CMP: Glucose 96, BUN 13, creatinine 0.72, sodium 141, potassium 4.0, chloride 102, calcium 9.5, albumin 4.3, total bilirubin 0.2, alk phos 78, AST 21, ALT 15 Lipid panel: Total cholesterol 175, triglycerides 187 (H), HDL 43, LDL 100 (H) Hemoglobin A1c: 5.6 Vitamin D, 25-hydroxy: 49.0 Lipase: 35   Courtney:  Please let patient know I received and reviewed her labs completed 02/11/2021.  As she is aware, her thyroid function was abnormal, consistent with hypothyroidism. Otherwise, white blood cell count, hemoglobin, platelets, electrolytes, kidney function, liver enzymes, and lipase were all within normal limits. This is good news. She does have elevated triglycerides and LDL.  Will defer to PCP on management of hypothyroidism and cholesterol. We will continue with recommendations made at her OV.

## 2021-02-17 ENCOUNTER — Other Ambulatory Visit: Payer: Self-pay

## 2021-02-17 ENCOUNTER — Ambulatory Visit (HOSPITAL_COMMUNITY)
Admission: RE | Admit: 2021-02-17 | Discharge: 2021-02-17 | Disposition: A | Discharge status: Home / Self Care | Payer: Medicare Other | Source: Ambulatory Visit | Attending: Family Medicine | Admitting: Family Medicine

## 2021-02-17 DIAGNOSIS — E039 Hypothyroidism, unspecified: Secondary | ICD-10-CM | POA: Diagnosis not present

## 2021-02-17 NOTE — Telephone Encounter (Addendum)
Spoke to pt informed her of results and recommendations. Pt voiced understanding. Pt stated she is very constipated, has been taking the Linzess, tying to eat lighter and drinking warm liquids to help move bowels. States nothing is working. Pt is wondering if she can take something with the Linzess.

## 2021-02-17 NOTE — Telephone Encounter (Signed)
She can try adding MiraLAX 1 capful 1-2 times daily in 8 oz of water. If this doesn't help, we can try Linzess 290 mcg daily. Have her call back in 1-2 weeks if MiraLAX is not helpful.

## 2021-02-17 NOTE — Telephone Encounter (Signed)
Spoke to pt informed her or recommendations. Pt voiced understanding.

## 2021-02-18 ENCOUNTER — Ambulatory Visit (HOSPITAL_COMMUNITY): Payer: Medicare Other | Admitting: Occupational Therapy

## 2021-02-19 ENCOUNTER — Other Ambulatory Visit: Payer: Self-pay

## 2021-02-19 ENCOUNTER — Ambulatory Visit (HOSPITAL_COMMUNITY): Payer: Medicare Other | Attending: Orthopedic Surgery | Admitting: Occupational Therapy

## 2021-02-19 ENCOUNTER — Encounter (HOSPITAL_COMMUNITY): Payer: Self-pay | Admitting: Occupational Therapy

## 2021-02-19 ENCOUNTER — Encounter (HOSPITAL_COMMUNITY): Payer: Self-pay

## 2021-02-19 ENCOUNTER — Ambulatory Visit (HOSPITAL_COMMUNITY): Payer: Medicare Other

## 2021-02-19 DIAGNOSIS — M25612 Stiffness of left shoulder, not elsewhere classified: Secondary | ICD-10-CM | POA: Insufficient documentation

## 2021-02-19 DIAGNOSIS — M25512 Pain in left shoulder: Secondary | ICD-10-CM | POA: Diagnosis not present

## 2021-02-19 DIAGNOSIS — R29898 Other symptoms and signs involving the musculoskeletal system: Secondary | ICD-10-CM | POA: Diagnosis not present

## 2021-02-19 NOTE — Patient Instructions (Signed)

## 2021-02-19 NOTE — Therapy (Signed)
[facility] [address] [place], [place], [address] Phone: [phone]   Fax:  [phone]  Occupational Therapy Evaluation  Patient Details  Name: Katie Davis MRN: [id number] Date of Birth: 10-19-1955 Referring Provider (OT): Dr. Thane Edu   Encounter Date: 02/19/2021   OT End of Session - 02/19/21 1000     Visit Number 1    Number of Visits 4    Date for OT Re-Evaluation 03/21/21    Authorization Type BCBS Medicare, $40 copay    Progress Note Due on Visit 10    OT Start Time 0900    OT Stop Time 0942    OT Time Calculation (min) 42 min    Activity Tolerance Patient tolerated treatment well    Behavior During Therapy [facility] for tasks assessed/performed             Past Medical History:  Diagnosis Date   Abnormal glandular Papanicolaou smear of cervix    Anxiety    Asthma    Body mass index 30.0-30.9, adult    Celiac disease    Chronic vascular disorder of intestine (HCC)    Fibromyalgia    Graves disease    Headache    Migrains   Hyperlipidemia    Hypertension    Hypothyroidism    IBS (irritable bowel syndrome) With constipation    Lupus (systemic lupus erythematosus) (HCC)    Obesity, unspecified    Osteoporosis    Raynaud disease    Sjogren's disease (HCC)     Past Surgical History:  Procedure Laterality Date   APPENDECTOMY     CHOLECYSTECTOMY     COLONOSCOPY WITH PROPOFOL N/A 06/10/2020   Surgeon: Lanelle Bal, DO;  diverticulosis and nonbleeding internal hemorrhoids, due for repeat in 2032.   OTHER SURGICAL HISTORY     biopsies on both feet   REDUCTION MAMMAPLASTY      There were no vitals filed for this visit.   Subjective Assessment - 02/19/21 0928     Subjective  S: It's my deltoid that hurts.    Pertinent History Pt is a 65 y/o female presenting with left shoulder pain after her seat went backwards and she kept her hold on the steering wheel with the left hand. Pt's massage therapist and  MD agree on left shoulder strain and pt was referred to occupational therapy for evaluation and treatment by Dr. Thane Edu.    Limitations Pt with fibromyalgia    Special Tests Complete FOTO next session    Patient Stated Goals To have less pain.    Currently in Pain? Yes    Pain Score 5     Pain Location Shoulder    Pain Orientation Left    Pain Descriptors / Indicators Aching;Cramping;Sore    Pain Type Acute pain    Pain Radiating Towards N/A    Pain Onset 1 to 4 weeks ago    Pain Frequency Intermittent    Aggravating Factors  movement, use    Pain Relieving Factors rest, heat/ice    Effect of Pain on Daily Activities mod effect on ADLs    Multiple Pain Sites No               [facility] OT Assessment - 02/19/21 0906       Assessment   Medical Diagnosis left shoulder pain    Referring Provider (OT) Dr. Thane Edu    Onset Date/Surgical Date 01/29/21    Hand Dominance Right  Next MD Visit None scheduled    Prior Therapy None for this      Precautions   Precautions None      Restrictions   Weight Bearing Restrictions No      Prior Function   Level of Independence Independent    Vocation On disability    Leisure Going to gym      ADL   ADL comments Pt is having difficulty reaching overhead and behind back, lifting items up, performing dressing and bathing tasks.      Written Expression   Dominant Hand Right      Cognition   Overall Cognitive Status Within Functional Limits for tasks assessed      ROM / Strength   AROM / PROM / Strength AROM;Strength;PROM      Palpation   Palpation comment mod fascial restrictions along upper arm and trapezius regions      AROM   Overall AROM Comments Assessed seated, er/IR adducted    AROM Assessment Site Shoulder    Right/Left Shoulder Left    Left Shoulder Flexion 94 Degrees    Left Shoulder ABduction 82 Degrees    Left Shoulder Internal Rotation 90 Degrees    Left Shoulder External Rotation 48 Degrees      PROM    Overall PROM Comments Assessed seated, er/IR adducted    PROM Assessment Site Shoulder    Right/Left Shoulder Left    Left Shoulder Flexion 115 Degrees    Left Shoulder ABduction 135 Degrees    Left Shoulder Internal Rotation 90 Degrees    Left Shoulder External Rotation 53 Degrees      Strength   Overall Strength Comments Assessed seated, er/IR adducted    Strength Assessment Site Shoulder    Right/Left Shoulder Left    Left Shoulder Flexion 3-/5    Left Shoulder ABduction 3-/5    Left Shoulder External Rotation 3/5    Left Shoulder Horizontal ABduction 3/5                      OT Treatments/Exercises (OP) - 02/19/21 1006       Manual Therapy   Manual Therapy Taping    Manual therapy comments Kinesiotape applied for shoulder pain and stability: Strip 1-Y strip surrounding medial deltoid at 25% stretch, strip 2-I strip along deltoid with 50% stretch, strip 3-I strip across deltoid with 50% stretch    Kinesiotex Facilitate Muscle                    OT Education - 02/19/21 0927     Education Details AA/ROM exercises    Person(s) Educated Patient    Methods Explanation;Demonstration;Handout    Comprehension Verbalized understanding;Returned demonstration              OT Short Term Goals - 02/19/21 1008       OT SHORT TERM GOAL #1   Title Pt will be provided with and educated on HEP to improve mobility required for ADL completion.    Time 4    Period Weeks    Status New    Target Date 03/21/21      OT SHORT TERM GOAL #2   Title Pt will decrease pain in LUE to 3/10 or less to improve ability to donn bra behind back.    Time 4    Period Weeks    Status New      OT SHORT TERM GOAL #3   Title Pt  will decrease LUE fascial restrictions to minimal amounts to improve mobility required for functional reaching tasks.    Time 4    Period Weeks    Status New      OT SHORT TERM GOAL #4   Title Pt will increase LUE A/ROM to [facility] to improve  ability to reach overhead into cabinets.    Time 4    Period Weeks    Status New      OT SHORT TERM GOAL #5   Title Pt will increase LUE strength to 4/5 or greater to improve ability to lift grocery bags.    Time 4    Period Weeks    Status New                      Plan - 02/19/21 1001     Clinical Impression Statement A: Pt is a 65 y/o female presenting with left shoulder pain that began approximately 3 weeks ago and is limiting ability to complete functional tasks using LUE as non-dominant. Pt reports kinesiotaping has helped her in the past, applied today with immediate results. Also requests to trial [place] at next visit.    OT Occupational Profile and History Problem Focused Assessment - Including review of records relating to presenting problem    Occupational performance deficits (Please refer to evaluation for details): ADL's;IADL's;Leisure;Rest and Sleep    Body Structure / Function / Physical Skills ADL;Endurance;UE functional use;Fascial restriction;Pain;ROM;IADL;Strength    Rehab Potential Good    Clinical Decision Making Limited treatment options, no task modification necessary    Comorbidities Affecting Occupational Performance: None    Modification or Assistance to Complete Evaluation  No modification of tasks or assist necessary to complete eval    OT Frequency 1x / week    OT Duration 4 weeks    OT Treatment/Interventions Self-care/ADL training;Ultrasound;DME and/or AE instruction;Patient/family education;Passive range of motion;Cryotherapy;Electrical Stimulation;Moist Heat;Therapeutic exercise;Manual Therapy;Therapeutic activities    Plan P: Pt will benefit from skilled OT services to decrease pain and fascial restrictions, increase joint ROM, strength, and functional use of LUE. Treatment plan: myofascial release, P/ROM, AA/ROM, A/ROM, shoulder stretches, scapular mobility/stability/strengthening, kinesiotaping, modalities prn    OT Home Exercise Plan eval:  AA/ROM in supine    Consulted and Agree with Plan of Care Patient             Patient will benefit from skilled therapeutic intervention in order to improve the following deficits and impairments:   Body Structure / Function / Physical Skills: ADL, Endurance, UE functional use, Fascial restriction, Pain, ROM, IADL, Strength       Visit Diagnosis: Acute pain of left shoulder  Stiffness of left shoulder, not elsewhere classified  Other symptoms and signs involving the musculoskeletal system    Problem List Patient Active Problem List   Diagnosis Date Noted   Gastroesophageal reflux disease 01/30/2021   Early satiety 01/30/2021   Bloating 01/30/2021   Celiac disease    Chronic vascular disorder of intestine (HCC)    DOE (dyspnea on exertion) 12/05/2020   Alternating constipation and diarrhea 05/07/2020   Preventative health care 05/07/2020   Nausea without vomiting 05/07/2020   Generalized abdominal pain 10/24/2019   Postmenopausal 12/14/2018   Vasomotor symptoms due to menopause 12/14/2018   History of abnormal cervical Pap smear 12/14/2018    Ezra Sites, OTR/L  [phone] 02/19/2021, 10:12 AM  [facility] [address] [place], [place], [address] Phone: [phone]  Fax:  [phone]  Name: Katie Davis MRN: [id number] Date of Birth: Dec 31, 1955

## 2021-02-21 ENCOUNTER — Ambulatory Visit (HOSPITAL_COMMUNITY): Payer: Medicare Other | Admitting: Occupational Therapy

## 2021-02-21 DIAGNOSIS — E0500 Thyrotoxicosis with diffuse goiter without thyrotoxic crisis or storm: Secondary | ICD-10-CM | POA: Diagnosis not present

## 2021-02-21 DIAGNOSIS — E890 Postprocedural hypothyroidism: Secondary | ICD-10-CM | POA: Diagnosis not present

## 2021-02-25 ENCOUNTER — Telehealth: Payer: Self-pay | Admitting: Gastroenterology

## 2021-02-25 ENCOUNTER — Encounter (HOSPITAL_COMMUNITY): Payer: Medicare Other | Admitting: Occupational Therapy

## 2021-02-25 NOTE — Telephone Encounter (Signed)
Patient would like to speak with you.

## 2021-02-26 ENCOUNTER — Encounter (HOSPITAL_COMMUNITY): Payer: Medicare Other

## 2021-02-26 ENCOUNTER — Telehealth (INDEPENDENT_AMBULATORY_CARE_PROVIDER_SITE_OTHER): Payer: Self-pay | Admitting: *Deleted

## 2021-02-26 NOTE — Telephone Encounter (Signed)
Noted. Will wait for you to talk with patient.

## 2021-02-26 NOTE — Telephone Encounter (Signed)
LMOM for pt to call office back 

## 2021-02-26 NOTE — Telephone Encounter (Signed)
Noted  

## 2021-02-26 NOTE — Telephone Encounter (Signed)
Patient has called few--several  times concerned over her hospital bill.  She states that she knew when she saw Minerva Areola she mentioned her constipation but this is not new issue she always has this  and it was time for her screening since she canceled due to she thought she had  covid.  You had amended her procedure note but the hospital note is what her insurance is going by for the hospital.  Per [facility] if this was mainly for screening and she we could ask and see you could make an amendment to the H&P.   I told her I would give you this message and not sure if we could do this but it might be possible to amend the original H&P to state for screening or surveillance like procedure note.

## 2021-02-26 NOTE — Telephone Encounter (Signed)
Spoke to pt she informed me that the Miralax is working and she feels better. Wanted to know if she could take Miralax and Linzess together.

## 2021-02-26 NOTE — Telephone Encounter (Signed)
Glad to hear she is feeling better. Yes, she can take MiraLAX and Linzess together.

## 2021-02-27 ENCOUNTER — Ambulatory Visit (HOSPITAL_COMMUNITY)
Admission: RE | Admit: 2021-02-27 | Discharge: 2021-02-27 | Disposition: A | Discharge status: Home / Self Care | Payer: Medicare Other | Source: Ambulatory Visit | Attending: Pulmonary Disease | Admitting: Pulmonary Disease

## 2021-02-27 ENCOUNTER — Encounter (HOSPITAL_COMMUNITY): Payer: Medicare Other | Admitting: Occupational Therapy

## 2021-02-27 DIAGNOSIS — R0609 Other forms of dyspnea: Secondary | ICD-10-CM | POA: Diagnosis not present

## 2021-02-27 LAB — PULMONARY FUNCTION TEST
DL/VA % pred: 84 %
DL/VA: 3.58 ml/min/mmHg/L
DLCO unc % pred: 68 %
DLCO unc: 13.2 ml/min/mmHg
FEF 25-75 Post: 2.13 L/sec
FEF 25-75 Pre: 1.95 L/sec
FEF2575-%Change-Post: 9 %
FEF2575-%Pred-Post: 100 %
FEF2575-%Pred-Pre: 92 %
FEV1-%Change-Post: 2 %
FEV1-%Pred-Post: 82 %
FEV1-%Pred-Pre: 81 %
FEV1-Post: 1.95 L
FEV1-Pre: 1.91 L
FEV1FVC-%Change-Post: 3 %
FEV1FVC-%Pred-Pre: 104 %
FEV6-%Change-Post: -1 %
FEV6-%Pred-Post: 79 %
FEV6-%Pred-Pre: 80 %
FEV6-Post: 2.33 L
FEV6-Pre: 2.36 L
FEV6FVC-%Pred-Post: 104 %
FEV6FVC-%Pred-Pre: 104 %
FVC-%Change-Post: -1 %
FVC-%Pred-Post: 76 %
FVC-%Pred-Pre: 76 %
FVC-Post: 2.33 L
FVC-Pre: 2.36 L
Post FEV1/FVC ratio: 83 %
Post FEV6/FVC ratio: 100 %
Pre FEV1/FVC ratio: 81 %
Pre FEV6/FVC Ratio: 100 %
RV % pred: 96 %
RV: 1.94 L
TLC % pred: 96 %
TLC: 4.75 L

## 2021-02-27 MED ORDER — ALBUTEROL SULFATE (2.5 MG/3ML) 0.083% IN NEBU
2.5000 mg | INHALATION_SOLUTION | Freq: Once | RESPIRATORY_TRACT | Status: AC
  Administered 2021-02-27: 2.5 mg via RESPIRATORY_TRACT

## 2021-03-03 ENCOUNTER — Encounter: Payer: Self-pay | Admitting: Pulmonary Disease

## 2021-03-03 ENCOUNTER — Other Ambulatory Visit: Payer: Self-pay

## 2021-03-03 ENCOUNTER — Telehealth (HOSPITAL_COMMUNITY): Payer: Self-pay | Admitting: Occupational Therapy

## 2021-03-03 ENCOUNTER — Ambulatory Visit (INDEPENDENT_AMBULATORY_CARE_PROVIDER_SITE_OTHER): Payer: Medicare Other | Admitting: Pulmonary Disease

## 2021-03-03 VITALS — BP 130/80 | HR 69 | Temp 98.2°F | Ht 63.0 in | Wt 175.0 lb

## 2021-03-03 DIAGNOSIS — J4550 Severe persistent asthma, uncomplicated: Secondary | ICD-10-CM | POA: Diagnosis not present

## 2021-03-03 DIAGNOSIS — J849 Interstitial pulmonary disease, unspecified: Secondary | ICD-10-CM | POA: Diagnosis not present

## 2021-03-03 DIAGNOSIS — Z8616 Personal history of COVID-19: Secondary | ICD-10-CM

## 2021-03-03 DIAGNOSIS — R0609 Other forms of dyspnea: Secondary | ICD-10-CM | POA: Diagnosis not present

## 2021-03-03 MED ORDER — TRELEGY ELLIPTA 100-62.5-25 MCG/ACT IN AEPB: 1.0000 | INHALATION_SPRAY | Freq: Every day | RESPIRATORY_TRACT | 5 refills | Status: DC

## 2021-03-03 MED ORDER — TRELEGY ELLIPTA 100-62.5-25 MCG/ACT IN AEPB: 1.0000 | INHALATION_SPRAY | Freq: Every day | RESPIRATORY_TRACT | 0 refills | Status: DC

## 2021-03-03 NOTE — Progress Notes (Signed)
[facility]  Chief Complaint  Patient presents with   Follow-up    Did PFT Thurs. Has questions about results.  Feels asthma has worsened since last OV.     Constitutional:  BP 130/80 (BP Location: Left Arm)    Pulse 69    Temp 98.2 F (36.8 C) (Temporal)    Ht 5\' 3"  (1.6 m)    Wt 175 lb 0.3 oz (79.4 kg)    SpO2 98% Comment: ra   BMI 31.00 kg/m   Past Medical History:  Anxiety, Celiac disease, Fibromyalgia, Grave's disease, HLD, Migraine headaches, HTN, IBS, Lupus, Osteoporosis, Raynaud disease, Sjogren's disease, COVID 19 August/September/October 2022  Past Surgical History:  She  has a past surgical history that includes Reduction mammaplasty; Cholecystectomy; Appendectomy; OTHER SURGICAL HISTORY; and Colonoscopy with propofol (N/A, 06/10/2020).  Brief Summary:  Katie Davis is a 65 y.o. female with asthma.      Subjective:   She had COVID 3 separate times from August through October.  She saw GI and had CT abd/pelvis.  Lung cuts showed more scarring in her lower lungs.  She was found to have worsening hypothyroidism.  Her PFT showed mild diffusion defect.  She has more cough and congestion in the morning.  Breo isn't helping as much as before.  Since her third episode of COVID she feels like her breathing gets cut off.  She isn't able to exercise like she did before.  Physical Exam:   Appearance - well kempt   ENMT - no sinus tenderness, no oral exudate, no LAN, Mallampati 3 airway, no stridor  Respiratory - equal breath sounds bilaterally, no wheezing or rales  CV - s1s2 regular rate and rhythm, no murmurs  Ext - no clubbing, no edema  Skin - no rashes  Psych - normal mood and affect      Pulmonary testing:  PFT 02/27/21 >> FEV1 1.95 (82%), FEV1% 83, TLC 4.75 (96%), DLCO 68%  Chest Imaging:    Social History:  She  reports that she has never smoked. She has never used smokeless tobacco. She reports that she  does not currently use alcohol. She reports that she does not use drugs.  Family History:  Her family history includes Aneurysm in her brother, father, and sister; Celiac disease in her brother, brother, and sister; Congestive Heart Failure in her mother; Irritable bowel syndrome in her sister; Melanoma in her brother; Other in her sister; Stroke in her sister.     Assessment/Plan:   Severe, persistent asthma. - will have her switch from breo to trelegy 100 one puff daily, and she can try using this at night - prn albuterol  Interstitial lung disease. - she has changes in lower lungs on CT abd/pelvis and PFT showed diffusion defect - could be related to COVID infections or from connective tissue diseases (Lupus, Sjogren's) - will arrange for high resolution CT chest to further assess  Grave's disease. - explained how thyroid dysfunction can impact her breathing and exercise tolerance - she is followed by Dr. 03/01/21 with Endocrinology  Time Spent Involved in Patient Care on Day of Examination:  33 minutes  Follow up:   Patient Instructions  Will have you switch from breo to trelegy one puff daily.  Use trelegy before going to bed.  Will arrange for high resolution CT chest and call you with results.  Follow up in 3 months.  Medication List:   Allergies as of 03/03/2021  Reactions   Prednisone Palpitations   Bactrim [sulfamethoxazole-trimethoprim] Nausea And Vomiting   Doxycycline    Made body feel crazy, increased heart rate   Gluten Meal    Ceiliac disease   Keflex [cephalexin]    Latex Hives   Levaquin [levofloxacin] Nausea And Vomiting   Sulfa Antibiotics Nausea And Vomiting   Contrast Media [iodinated Diagnostic Agents] Nausea And Vomiting, Rash   Per patient, did fine with premedication with IV steroids before         Medication List        Accurate as of March 03, 2021 10:39 AM. If you have any questions, ask your nurse or doctor.           STOP taking these medications    Breo Ellipta 100-25 MCG/ACT Aepb Generic drug: fluticasone furoate-vilanterol Stopped by: Chesley Mires, MD       TAKE these medications    acidophilus Caps capsule Take 1 capsule by mouth daily.   albuterol 108 (90 Base) MCG/ACT inhaler Commonly known as: VENTOLIN HFA Inhale 1-2 puffs into the lungs every 6 (six) hours as needed for wheezing or shortness of breath.   atenolol 50 MG tablet Commonly known as: TENORMIN Take 50 mg by mouth 2 (two) times daily.   calcium carbonate 500 MG chewable tablet Commonly known as: TUMS - dosed in mg elemental calcium Chew 1 tablet by mouth daily.   clonazePAM 0.5 MG tablet Commonly known as: KLONOPIN Take 0.25-0.5 mg by mouth 2 (two) times daily as needed for anxiety.   cyclobenzaprine 10 MG tablet Commonly known as: FLEXERIL Take 10 mg by mouth 3 (three) times daily as needed for muscle spasms.   Diclofenac-miSOPROStol 50-0.2 MG Tbec Take 1 tablet by mouth 2 (two) times daily.   ELDERBERRY PO Take 1 capsule by mouth daily.   fluorometholone 0.1 % ophthalmic suspension Commonly known as: FML Place 1 drop into both eyes in the morning and at bedtime.   ibuprofen 800 MG tablet Commonly known as: ADVIL Take 800 mg by mouth every 8 (eight) hours as needed for moderate pain.   linaclotide 145 MCG Caps capsule Commonly known as: Linzess Take 1 capsule (145 mcg total) by mouth daily before breakfast.   meclizine 12.5 MG tablet Commonly known as: ANTIVERT Take 12.5-25 mg by mouth every 6 (six) hours as needed for dizziness.   ondansetron 4 MG tablet Commonly known as: Zofran Take 1 tablet (4 mg total) by mouth every 8 (eight) hours as needed for nausea or vomiting.   oxyCODONE-acetaminophen 5-325 MG tablet Commonly known as: PERCOCET/ROXICET Take 1 tablet by mouth 2 (two) times daily as needed for moderate pain or severe pain.   pantoprazole 40 MG tablet Commonly known as: PROTONIX Take  1 tablet (40 mg total) by mouth daily before breakfast.   Restasis 0.05 % ophthalmic emulsion Generic drug: cycloSPORINE Place 1 drop into both eyes 2 (two) times daily.   rosuvastatin 5 MG tablet Commonly known as: CRESTOR Take 5 mg by mouth 4 (four) times a week.   Synthroid 112 MCG tablet Generic drug: levothyroxine Take 112 mcg by mouth. Takes 5 days a week.   SYSTANE OP Place 1 drop into both eyes daily.   Trelegy Ellipta 100-62.5-25 MCG/ACT Aepb Generic drug: Fluticasone-Umeclidin-Vilant Inhale 1 puff into the lungs daily in the afternoon. Started by: Chesley Mires, MD   Vitamin D3 Gummies 25 MCG (1000 UT) Chew Generic drug: Cholecalciferol Chew 6,000 Units by mouth daily.  Signature:  Chesley Mires, MD [place] Pager - [phone] 03/03/2021, 10:39 AM

## 2021-03-03 NOTE — Telephone Encounter (Signed)
I went back and addended patient's original H&P to reflect colonoscopy being performed for colon cancer screening purposes.  Thank you

## 2021-03-03 NOTE — Addendum Note (Signed)
Addended by: Carleene Mains D on: 03/03/2021 11:49 AM   Modules accepted: Orders

## 2021-03-03 NOTE — Telephone Encounter (Signed)
She has so many apptments this week and wants to take a break until after Christmas. She will return on 03/19/2021

## 2021-03-03 NOTE — Patient Instructions (Signed)
Will have you switch from breo to trelegy one puff daily.  Use trelegy before going to bed.  Will arrange for high resolution CT chest and call you with results.  Follow up in 3 months.

## 2021-03-04 ENCOUNTER — Ambulatory Visit (HOSPITAL_COMMUNITY): Payer: Medicare Other | Admitting: Occupational Therapy

## 2021-03-04 ENCOUNTER — Other Ambulatory Visit: Payer: Medicare Other | Admitting: Women's Health

## 2021-03-04 NOTE — Telephone Encounter (Signed)
Will route to VS as an [place]

## 2021-03-05 ENCOUNTER — Telehealth: Payer: Self-pay | Admitting: Pulmonary Disease

## 2021-03-05 ENCOUNTER — Other Ambulatory Visit: Payer: Self-pay

## 2021-03-05 ENCOUNTER — Ambulatory Visit (HOSPITAL_COMMUNITY)
Admission: RE | Admit: 2021-03-05 | Discharge: 2021-03-05 | Disposition: A | Discharge status: Home / Self Care | Payer: Medicare Other | Source: Ambulatory Visit | Attending: Pulmonary Disease | Admitting: Pulmonary Disease

## 2021-03-05 DIAGNOSIS — J84112 Idiopathic pulmonary fibrosis: Secondary | ICD-10-CM | POA: Diagnosis not present

## 2021-03-05 DIAGNOSIS — J479 Bronchiectasis, uncomplicated: Secondary | ICD-10-CM | POA: Diagnosis not present

## 2021-03-05 DIAGNOSIS — L648 Other androgenic alopecia: Secondary | ICD-10-CM | POA: Diagnosis not present

## 2021-03-05 DIAGNOSIS — D225 Melanocytic nevi of trunk: Secondary | ICD-10-CM | POA: Diagnosis not present

## 2021-03-05 DIAGNOSIS — I272 Pulmonary hypertension, unspecified: Secondary | ICD-10-CM

## 2021-03-05 DIAGNOSIS — J849 Interstitial pulmonary disease, unspecified: Secondary | ICD-10-CM | POA: Diagnosis not present

## 2021-03-05 DIAGNOSIS — I2510 Atherosclerotic heart disease of native coronary artery without angina pectoris: Secondary | ICD-10-CM | POA: Diagnosis not present

## 2021-03-05 DIAGNOSIS — Z1283 Encounter for screening for malignant neoplasm of skin: Secondary | ICD-10-CM | POA: Diagnosis not present

## 2021-03-05 DIAGNOSIS — L820 Inflamed seborrheic keratosis: Secondary | ICD-10-CM | POA: Diagnosis not present

## 2021-03-05 DIAGNOSIS — I700 Atherosclerosis of aorta: Secondary | ICD-10-CM | POA: Diagnosis not present

## 2021-03-05 NOTE — Telephone Encounter (Signed)
Pt was calling regarding CT results.  Wanted Dr. Craige Cotta to be aware they were available.  She had some concerns regarding the CT.    Dr. Craige Cotta please advise. Thanks!

## 2021-03-05 NOTE — Telephone Encounter (Signed)
HRCT chest 03/05/21 >> enlarged PA, atherosclerosis, basilar and peripheral GGO with bronchiectasis, calcified granuloma LLL, air trapping  Reviewed results with her.  GGO and BTX with air trapping likely from prior COVID and asthma.  She has more cough with trelegy and will switch back to breo.  Calcified granuloma is benign findings.  Will arrange for Echo to assess for pulmonary hypertension.  She has cardiology appointment with Dr. Wyline Mood on 04/04/21.

## 2021-03-06 ENCOUNTER — Telehealth: Payer: Self-pay | Admitting: Pulmonary Disease

## 2021-03-06 ENCOUNTER — Encounter (HOSPITAL_COMMUNITY): Payer: Medicare Other | Admitting: Occupational Therapy

## 2021-03-06 ENCOUNTER — Telehealth: Payer: Self-pay | Admitting: Cardiology

## 2021-03-06 NOTE — Telephone Encounter (Signed)
° °  Pt just had her CT of her chest an result came in and she would like to Dr. Diona Browner to look at I before seeing him on 04/04/21

## 2021-03-06 NOTE — Telephone Encounter (Signed)
Would discuss in detail at our appt  Dominga Ferry MD

## 2021-03-06 NOTE — Telephone Encounter (Signed)
Will route to VS as an [place]

## 2021-03-06 NOTE — Telephone Encounter (Signed)
New pt for Dr. Wyline Mood to be seen on 04/04/2021. Will fwd to provider.   Pt aware of CT results per Dr Craige Cotta. See phone note from 03/05/2021.

## 2021-03-07 ENCOUNTER — Ambulatory Visit (HOSPITAL_COMMUNITY)
Admission: RE | Admit: 2021-03-07 | Discharge: 2021-03-07 | Disposition: A | Discharge status: Home / Self Care | Payer: Medicare Other | Source: Ambulatory Visit | Attending: Pulmonary Disease | Admitting: Pulmonary Disease

## 2021-03-07 ENCOUNTER — Other Ambulatory Visit: Payer: Self-pay

## 2021-03-07 DIAGNOSIS — I272 Pulmonary hypertension, unspecified: Secondary | ICD-10-CM | POA: Insufficient documentation

## 2021-03-07 LAB — ECHOCARDIOGRAM COMPLETE
AR max vel: 1.75 cm2
AV Area VTI: 1.92 cm2
AV Area mean vel: 1.82 cm2
AV Mean grad: 8.5 mmHg
AV Peak grad: 16.8 mmHg
Ao pk vel: 2.05 m/s
Area-P 1/2: 3.48 cm2
Calc EF: 54 %
MV VTI: 3.75 cm2
S' Lateral: 1.9 cm
Single Plane A2C EF: 49.6 %
Single Plane A4C EF: 57.1 %

## 2021-03-07 NOTE — Progress Notes (Signed)
*  PRELIMINARY RESULTS* Echocardiogram 2D Echocardiogram has been performed.  [name] 03/07/2021, 10:34 AM

## 2021-03-07 NOTE — Telephone Encounter (Signed)
We are able to see in CareEverywhere section of pt's chart CTa that was performed 07/04/17 at [facility]. Need to check with pt to see if this is the CT she is talking about.  Attempted to call pt but unable to reach. Left message for her to return call.

## 2021-03-07 NOTE — Telephone Encounter (Signed)
Spoke with the pt  She states that the scan that she called about is the one we can view in care everywhere (07/04/17) She just wanted to see if Dr Sherene Sires would review the report and let her know if anything needs fu   CT Angiogram Abdomen Pelvis w Contrast  Anatomical Region Laterality Modality  Body -- Computed Tomography   Impression  1.  No abdominal aortic aneurysm or dissection.  2.  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  3.  No acute intra-abdominal abnormality.  No evidence of enteritis.   I, Dolores Patty, have reviewed the examination and concur with the findings as reported or so edited.  Resident/Fellow:Derek Chicarilli, MD    [id number]  Up-to-date CT equipment and radiation dose reduction techniques were employed. CTDIvol: 12.3 - 13.4 mGy. DLP: 696 mGy-cm. Narrative  3-D reformats were created.   COMPARISON: CT of the abdomen and pelvis dated 06/18/2016 and priors dating back to 03/21/2012.   VASCULAR:  The abdominal aorta is of normal course and caliber without aneurysmal dilatation, dissection, or focal stenosis. There is scattered calcified and noncalcified atheromatous disease of the infrarenal aorta.  There is mild- moderate stenosis at the origin of SMA, with patent proximal portion.  The celiac and inferior mesenteric arteries are patent. Minimal calcification at the ostia of bilateral renal arteries without hemodynamically significant stenosis. Bilateral renal arteries are patent.   LOWER THORAX: Minimal basilar subsegmental atelectasis.  No pleural effusion.   HEPATOBILIARY:No focal hepatic lesions identified within the limitations of phase of imaging. No intrahepatic or extrahepatic biliary dilatation.   GALLBLADDER: Status post cholecystectomy.   SPLEEN: No splenomegaly.   PANCREAS: No inflammation or ductal dilatation.   ADRENAL GLANDS: No nodules.   KIDNEYS AND URETERS: Symmetric renal size and enhancement.  No  nephrolithiasis or hydronephrosis.  Ureters are decompressed.   GI TRACT: Stomach is not distended.  The small bowel and colon are not dilated.  Mild to moderate colonic fecal retention.  No evidence of enteritis, colitis or diverticulitis.   The appendix is not identified.   PERITONEUM AND RETROPERITONEUM:No free air. No free fluid.   LYMPH NODES: A few small subcentimeter mesenteric and retroperitoneal lymph nodes, nonspecific.   PELVIC ORGANS AND BLADDER: The urinary bladder is mildly distended. The uterus is anteverted and anteflexed and demonstrates normal attenuation and enhancement characteristics. Bilateral adnexa are unremarkable.   BONES AND SOFT TISSUES: No acute osseous abnormality.  Degenerative changes in the spine. Procedure Note  Dolores Patty, MD - 07/04/2017  Formatting of this note might be different from the original.  3-D reformats were created.   COMPARISON: CT of the abdomen and pelvis dated 06/18/2016 and priors dating back to 03/21/2012.   VASCULAR:  The abdominal aorta is of normal course and caliber without aneurysmal dilatation, dissection, or focal stenosis. There is scattered calcified and noncalcified atheromatous disease of the infrarenal aorta.  There is mild- moderate stenosis at the origin of SMA, with patent proximal portion.  The celiac and inferior mesenteric arteries are patent. Minimal calcification at the ostia of bilateral renal arteries without hemodynamically significant stenosis. Bilateral renal arteries are patent.   LOWER THORAX: Minimal basilar subsegmental atelectasis.  No pleural effusion.   HEPATOBILIARY:No focal hepatic lesions identified within the limitations of phase of imaging. No intrahepatic or extrahepatic biliary dilatation.   GALLBLADDER: Status post cholecystectomy.   SPLEEN: No splenomegaly.   PANCREAS: No inflammation or ductal dilatation.   ADRENAL GLANDS: No nodules.  KIDNEYS AND URETERS: Symmetric renal size and  enhancement.  No nephrolithiasis or hydronephrosis.  Ureters are decompressed.   GI TRACT: Stomach is not distended.  The small bowel and colon are not dilated.  Mild to moderate colonic fecal retention.  No evidence of enteritis, colitis or diverticulitis.   The appendix is not identified.   PERITONEUM AND RETROPERITONEUM:No free air. No free fluid.   LYMPH NODES: A few small subcentimeter mesenteric and retroperitoneal lymph nodes, nonspecific.   PELVIC ORGANS AND BLADDER: The urinary bladder is mildly distended. The uterus is anteverted and anteflexed and demonstrates normal attenuation and enhancement characteristics. Bilateral adnexa are unremarkable.   BONES AND SOFT TISSUES: No acute osseous abnormality.  Degenerative changes in the spine.   IMPRESSION:  1.  No abdominal aortic aneurysm or dissection.  2.  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  3.  No acute intra-abdominal abnormality.  No evidence of enteritis

## 2021-03-07 NOTE — Telephone Encounter (Signed)
Pt verbalized understanding.

## 2021-03-11 ENCOUNTER — Telehealth: Payer: Self-pay

## 2021-03-11 NOTE — Telephone Encounter (Signed)
Called patient but she did not answer. Left message for her to call back.  

## 2021-03-11 NOTE — Telephone Encounter (Signed)
Pt seen Dr. Craige Cotta recently and wanted to see if she needed to be seen sooner than 3 months since she is scheduled to see a cardiologist and having echo done.  Wanted to see about getting exercise again and other questions.  Please advise. [phone]

## 2021-03-11 NOTE — Telephone Encounter (Signed)
Patient wants to know since her echo came back okay if she still needs to go to a  cardiologist.   Patient has not been active in apporx. 4 months and is asking if she can have post Covid pulmonary rehab?   Dr. Craige Cotta please advise.

## 2021-03-11 NOTE — Telephone Encounter (Signed)
She should keep appointment with cardiology to assess significance of coronary atherosclerosis seen on her CT chest.  Okay to send referral to pulmonary rehab.

## 2021-03-11 NOTE — Telephone Encounter (Signed)
No immediate problem identified - the narrowing of the artery mentioned is a sign of a systemic problem with athersclerosis which should discuss with PCP ( I see already on crestor for that so it's a good start)  In the future if do start having abd pain after eating it might be a sign that the artery is getting more narrow and need to bring that to attention of PCP or GI

## 2021-03-12 ENCOUNTER — Encounter (HOSPITAL_COMMUNITY): Payer: Medicare Other | Admitting: Occupational Therapy

## 2021-03-12 ENCOUNTER — Other Ambulatory Visit: Payer: Self-pay

## 2021-03-12 DIAGNOSIS — R0609 Other forms of dyspnea: Secondary | ICD-10-CM

## 2021-03-12 NOTE — Telephone Encounter (Signed)
Called and spoke to patient. She states she will keep cardiology appt. Informed her about pulm. Rehab.   Order placed. Nothing further needed.

## 2021-03-14 ENCOUNTER — Encounter (HOSPITAL_COMMUNITY): Payer: Medicare Other | Admitting: Occupational Therapy

## 2021-03-18 DIAGNOSIS — R053 Chronic cough: Secondary | ICD-10-CM | POA: Diagnosis not present

## 2021-03-18 DIAGNOSIS — R002 Palpitations: Secondary | ICD-10-CM | POA: Diagnosis not present

## 2021-03-18 DIAGNOSIS — E039 Hypothyroidism, unspecified: Secondary | ICD-10-CM | POA: Diagnosis not present

## 2021-03-18 DIAGNOSIS — J849 Interstitial pulmonary disease, unspecified: Secondary | ICD-10-CM | POA: Diagnosis not present

## 2021-03-19 ENCOUNTER — Encounter (HOSPITAL_COMMUNITY): Payer: Medicare Other | Admitting: Occupational Therapy

## 2021-03-20 ENCOUNTER — Encounter (HOSPITAL_COMMUNITY)
Admission: RE | Admit: 2021-03-20 | Discharge: 2021-03-20 | Disposition: A | Discharge status: Home / Self Care | Payer: Medicare Other | Source: Ambulatory Visit | Attending: Pulmonary Disease | Admitting: Pulmonary Disease

## 2021-03-20 ENCOUNTER — Other Ambulatory Visit: Payer: Self-pay

## 2021-03-20 VITALS — BP 116/78 | HR 68 | Ht 63.0 in | Wt 173.9 lb

## 2021-03-20 DIAGNOSIS — R0609 Other forms of dyspnea: Secondary | ICD-10-CM | POA: Diagnosis present

## 2021-03-20 DIAGNOSIS — U099 Post covid-19 condition, unspecified: Secondary | ICD-10-CM | POA: Diagnosis present

## 2021-03-20 NOTE — Progress Notes (Signed)
Pulmonary Individual Treatment Plan  Patient Details  Name: Katie Davis MRN: [id number] Date of Birth: 1955/09/28 Referring Provider:   Flowsheet Row PULMONARY REHAB OTHER RESP ORIENTATION from 03/20/2021 in [place]  Referring Provider Dr. Halford Chessman       Initial Encounter Date:  Flowsheet Row PULMONARY REHAB OTHER RESP ORIENTATION from 03/20/2021 in [place]  Date 03/20/21       Visit Diagnosis: DOE (dyspnea on exertion)  Post covid-19 condition, unspecified  Patient's Home Medications on Admission:   Current Outpatient Medications:    acidophilus (RISAQUAD) CAPS capsule, Take 1 capsule by mouth daily., Disp: , Rfl:    albuterol (VENTOLIN HFA) 108 (90 Base) MCG/ACT inhaler, Inhale 1-2 puffs into the lungs every 6 (six) hours as needed for wheezing or shortness of breath., Disp: , Rfl:    atenolol (TENORMIN) 50 MG tablet, Take 50 mg by mouth 2 (two) times daily. , Disp: , Rfl:    calcium carbonate (TUMS - DOSED IN MG ELEMENTAL CALCIUM) 500 MG chewable tablet, Chew 1 tablet by mouth daily., Disp: , Rfl:    Cholecalciferol (VITAMIN D3 GUMMIES) 25 MCG (1000 UT) CHEW, Chew 6,000 Units by mouth daily., Disp: , Rfl:    clonazePAM (KLONOPIN) 0.5 MG tablet, Take 0.25-0.5 mg by mouth 2 (two) times daily as needed for anxiety. , Disp: , Rfl:    cyclobenzaprine (FLEXERIL) 10 MG tablet, Take 10 mg by mouth 3 (three) times daily as needed for muscle spasms. , Disp: , Rfl:    ELDERBERRY PO, Take 1 capsule by mouth daily., Disp: , Rfl:    fluticasone furoate-vilanterol (BREO ELLIPTA) 100-25 MCG/ACT AEPB, Inhale 1 puff into the lungs daily., Disp: , Rfl:    ibuprofen (ADVIL,MOTRIN) 800 MG tablet, Take 800 mg by mouth every 8 (eight) hours as needed for moderate pain., Disp: , Rfl:    linaclotide (LINZESS) 145 MCG CAPS capsule, Take 1 capsule (145 mcg total) by mouth daily before breakfast., Disp: 30 capsule, Rfl: 3   meclizine (ANTIVERT) 12.5 MG tablet,  Take 12.5-25 mg by mouth every 6 (six) hours as needed for dizziness. , Disp: , Rfl:    Omega-3 Fatty Acids (FISH OIL) 1000 MG CAPS, Take 2,000 mg by mouth daily., Disp: , Rfl:    ondansetron (ZOFRAN) 4 MG tablet, Take 1 tablet (4 mg total) by mouth every 8 (eight) hours as needed for nausea or vomiting., Disp: 12 tablet, Rfl: 0   oxyCODONE-acetaminophen (PERCOCET/ROXICET) 5-325 MG tablet, Take 1 tablet by mouth 2 (two) times daily as needed for moderate pain or severe pain. , Disp: , Rfl:    pantoprazole (PROTONIX) 40 MG tablet, Take 1 tablet (40 mg total) by mouth daily before breakfast., Disp: 30 tablet, Rfl: 3   RESTASIS 0.05 % ophthalmic emulsion, Place 1 drop into both eyes 2 (two) times daily., Disp: , Rfl:    rosuvastatin (CRESTOR) 5 MG tablet, Take 5 mg by mouth 4 (four) times a week., Disp: , Rfl:    SYNTHROID 112 MCG tablet, Take 112 mcg by mouth. Takes 5 days a week., Disp: , Rfl:    Diclofenac-miSOPROStol 50-0.2 MG TBEC, Take 1 tablet by mouth 2 (two) times daily. (Patient not taking: Reported on 03/20/2021), Disp: 60 tablet, Rfl: 0   Fluticasone-Umeclidin-Vilant (TRELEGY ELLIPTA) 100-62.5-25 MCG/ACT AEPB, Inhale 1 puff into the lungs daily in the afternoon. (Patient not taking: Reported on 03/20/2021), Disp: 60 each, Rfl: 5   Fluticasone-Umeclidin-Vilant (TRELEGY ELLIPTA) 100-62.5-25 MCG/ACT AEPB, Inhale 1  puff into the lungs daily., Disp: 54 each, Rfl: 0  Past Medical History: Past Medical History:  Diagnosis Date   Abnormal glandular Papanicolaou smear of cervix    Anxiety    Asthma    Body mass index 30.0-30.9, adult    Celiac disease    Chronic vascular disorder of intestine (HCC)    Fibromyalgia    Graves disease    Headache    Migrains   Hyperlipidemia    Hypertension    Hypothyroidism    IBS (irritable bowel syndrome) With constipation    Lupus (systemic lupus erythematosus) (HCC)    Obesity, unspecified    Osteoporosis    Raynaud disease    Sjogren's disease ([place])      Tobacco Use: Social History   Tobacco Use  Smoking Status Never  Smokeless Tobacco Never    Labs: Recent Review Flowsheet Data   There is no flowsheet data to display.     Capillary Blood Glucose: No results found for: GLUCAP   Pulmonary Assessment Scores:  Pulmonary Assessment Scores     Row Name 03/20/21 0848         ADL UCSD   SOB Score total 38     Rest 0     Walk 3     Stairs 4     Bath 1     Dress 1     Shop 3       CAT Score   CAT Score 18       mMRC Score   mMRC Score 3             UCSD: Self-administered rating of dyspnea associated with activities of daily living (ADLs) 6-point scale (0 = "not at all" to 5 = "maximal or unable to do because of breathlessness")  Scoring Scores range from 0 to 120.  Minimally important difference is 5 units  CAT: CAT can identify the health impairment of COPD patients and is better correlated with disease progression.  CAT has a scoring range of zero to 40. The CAT score is classified into four groups of low (less than 10), medium (10 - 20), high (21-30) and very high (31-40) based on the impact level of disease on health status. A CAT score over 10 suggests significant symptoms.  A worsening CAT score could be explained by an exacerbation, poor medication adherence, poor inhaler technique, or progression of COPD or comorbid conditions.  CAT MCID is 2 points  mMRC: mMRC (Modified Medical Research Council) Dyspnea Scale is used to assess the degree of baseline functional disability in patients of respiratory disease due to dyspnea. No minimal important difference is established. A decrease in score of 1 point or greater is considered a positive change.   Pulmonary Function Assessment:   Exercise Target Goals: Exercise Program Goal: Individual exercise prescription set using results from initial 6 min walk test and THRR while considering  patients activity barriers and safety.   Exercise Prescription  Goal: Initial exercise prescription builds to 30-45 minutes a day of aerobic activity, 2-3 days per week.  Home exercise guidelines will be given to patient during program as part of exercise prescription that the participant will acknowledge.  Activity Barriers & Risk Stratification:  Activity Barriers & Cardiac Risk Stratification - 03/20/21 0845       Activity Barriers & Cardiac Risk Stratification   Activity Barriers Back Problems;Arthritis;Fibromyalgia;Joint Problems;Deconditioning;Shortness of Breath    Cardiac Risk Stratification Low  6 Minute Walk:  6 Minute Walk     Row Name 03/20/21 1102         6 Minute Walk   Phase Initial     Distance 1250 feet     Walk Time 6 minutes     # of Rest Breaks 0     MPH 2.37     METS 2.76     RPE 13     Perceived Dyspnea  15     VO2 Peak 9.68     Symptoms No     Resting HR 68 bpm     Resting BP 116/78     Resting Oxygen Saturation  95 %     Exercise Oxygen Saturation  during 6 min walk 96 %     Max Ex. HR 86 bpm     Max Ex. BP 132/76     2 Minute Post BP 110/78       Interval HR   1 Minute HR 76     2 Minute HR 80     3 Minute HR 81     4 Minute HR 86     5 Minute HR 80     6 Minute HR 77     2 Minute Post HR 69     Interval Heart Rate? Yes       Interval Oxygen   Interval Oxygen? Yes     Baseline Oxygen Saturation % 95 %     1 Minute Oxygen Saturation % 96 %     1 Minute Liters of Oxygen 0 L     2 Minute Oxygen Saturation % 96 %     2 Minute Liters of Oxygen 0 L     3 Minute Oxygen Saturation % 97 %     3 Minute Liters of Oxygen 0 L     4 Minute Oxygen Saturation % 97 %     4 Minute Liters of Oxygen 0 L     5 Minute Oxygen Saturation % 99 %     5 Minute Liters of Oxygen 0 L     6 Minute Oxygen Saturation % 99 %     6 Minute Liters of Oxygen 0 L     2 Minute Post Oxygen Saturation % 99 %     2 Minute Post Liters of Oxygen 0 L              Oxygen Initial Assessment:  Oxygen Initial  Assessment - 03/20/21 1101       Initial 6 min Walk   Oxygen Used None      Program Oxygen Prescription   Program Oxygen Prescription None      Intervention   Short Term Goals To learn and understand importance of monitoring SPO2 with pulse oximeter and demonstrate accurate use of the pulse oximeter.;To learn and understand importance of maintaining oxygen saturations>88%;To learn and demonstrate proper pursed lip breathing techniques or other breathing techniques. ;To learn and demonstrate proper use of respiratory medications    Long  Term Goals Verbalizes importance of monitoring SPO2 with pulse oximeter and return demonstration;Maintenance of O2 saturations>88%;Exhibits proper breathing techniques, such as pursed lip breathing or other method taught during program session;Compliance with respiratory medication             Oxygen Re-Evaluation:   Oxygen Discharge (Final Oxygen Re-Evaluation):   Initial Exercise Prescription:  Initial Exercise Prescription - 03/20/21 1100       Date of  Initial Exercise RX and Referring Provider   Date 03/20/21    Referring Provider Dr. Halford Chessman    Expected Discharge Date 07/24/21      Treadmill   MPH 1    Grade 0    Minutes 17      NuStep   Level 1    SPM 80    Minutes 22      Prescription Details   Frequency (times per week) 2    Duration Progress to 30 minutes of continuous aerobic without signs/symptoms of physical distress      Intensity   THRR 40-80% of Max Heartrate 62-125    Ratings of Perceived Exertion 11-13    Perceived Dyspnea 0-4      Resistance Training   Training Prescription Yes    Weight 2 lbs    Reps 10-15             Perform Capillary Blood Glucose checks as needed.  Exercise Prescription Changes:   Exercise Comments:   Exercise Goals and Review:   Exercise Goals     Row Name 03/20/21 1105             Exercise Goals   Increase Physical Activity Yes       Intervention Provide advice,  education, support and counseling about physical activity/exercise needs.;Develop an individualized exercise prescription for aerobic and resistive training based on initial evaluation findings, risk stratification, comorbidities and participant's personal goals.       Expected Outcomes Short Term: Attend rehab on a regular basis to increase amount of physical activity.;Long Term: Exercising regularly at least 3-5 days a week.;Long Term: Add in home exercise to make exercise part of routine and to increase amount of physical activity.       Increase Strength and Stamina Yes       Intervention Provide advice, education, support and counseling about physical activity/exercise needs.;Develop an individualized exercise prescription for aerobic and resistive training based on initial evaluation findings, risk stratification, comorbidities and participant's personal goals.       Expected Outcomes Short Term: Increase workloads from initial exercise prescription for resistance, speed, and METs.;Short Term: Perform resistance training exercises routinely during rehab and add in resistance training at home;Long Term: Improve cardiorespiratory fitness, muscular endurance and strength as measured by increased METs and functional capacity (6MWT)       Able to understand and use rate of perceived exertion (RPE) scale Yes       Intervention Provide education and explanation on how to use RPE scale       Expected Outcomes Short Term: Able to use RPE daily in rehab to express subjective intensity level;Long Term:  Able to use RPE to guide intensity level when exercising independently       Able to understand and use Dyspnea scale Yes       Intervention Provide education and explanation on how to use Dyspnea scale       Expected Outcomes Short Term: Able to use Dyspnea scale daily in rehab to express subjective sense of shortness of breath during exertion;Long Term: Able to use Dyspnea scale to guide intensity level when  exercising independently       Knowledge and understanding of Target Heart Rate Range (THRR) Yes       Intervention Provide education and explanation of THRR including how the numbers were predicted and where they are located for reference       Expected Outcomes Short Term: Able to state/look up THRR;Long  Term: Able to use THRR to govern intensity when exercising independently;Short Term: Able to use daily as guideline for intensity in rehab       Understanding of Exercise Prescription Yes       Intervention Provide education, explanation, and written materials on patient's individual exercise prescription       Expected Outcomes Short Term: Able to explain program exercise prescription;Long Term: Able to explain home exercise prescription to exercise independently                Exercise Goals Re-Evaluation :   Discharge Exercise Prescription (Final Exercise Prescription Changes):   Nutrition:  Target Goals: Understanding of nutrition guidelines, daily intake of sodium 1500mg , cholesterol 200mg , calories 30% from fat and 7% or less from saturated fats, daily to have 5 or more servings of fruits and vegetables.  Biometrics:  Pre Biometrics - 03/20/21 1110       Pre Biometrics   Height 5\' 3"  (1.6 m)    Weight 173 lb 15.1 oz (78.9 kg)    Waist Circumference 41 inches    Hip Circumference 45.5 inches    Waist to Hip Ratio 0.9 %    BMI (Calculated) 30.82    Triceps Skinfold 35 mm    % Body Fat 43.9 %    Grip Strength 21.2 kg    Flexibility 0 in    Single Leg Stand 22.64 seconds              Nutrition Therapy Plan and Nutrition Goals:   Nutrition Assessments:  Nutrition Assessments - 03/20/21 0858       MEDFICTS Scores   Pre Score 0            MEDIFICTS Score Key: ?70 Need to make dietary changes  40-70 Heart Healthy Diet ? 40 Therapeutic Level Cholesterol Diet   Picture Your Plate Scores: [id number] Unhealthy dietary pattern with much room for  improvement. 41-50 Dietary pattern unlikely to meet recommendations for good health and room for improvement. 51-60 More healthful dietary pattern, with some room for improvement.  >60 Healthy dietary pattern, although there may be some specific behaviors that could be improved.    Nutrition Goals Re-Evaluation:   Nutrition Goals Discharge (Final Nutrition Goals Re-Evaluation):   Psychosocial: Target Goals: Acknowledge presence or absence of significant depression and/or stress, maximize coping skills, provide positive support system. Participant is able to verbalize types and ability to use techniques and skills needed for reducing stress and depression.  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 This is very stressful for her. She is also stressed about the long term effects of having COVID which has increased her SOB and activity tolerance.      Family Dynamics   Good Support System? Yes    Comments Her older sister is her main support system.      Barriers   Psychosocial barriers to participate in program The patient should benefit from training in stress management and relaxation.;Psychosocial barriers identified (see note)      Screening Interventions   Interventions Encouraged to exercise;To provide support and resources with identified psychosocial needs;Provide feedback about the scores to participant    Expected Outcomes Long Term  goal: The participant improves quality of Life and PHQ9 Scores as seen by post scores and/or verbalization of changes;Short Term goal: Identification and review with participant of any Quality of Life or Depression concerns found by scoring the questionnaire.;Long Term Goal:  Stressors or current issues are controlled or eliminated.             Quality of Life Scores:  Quality of Life - 03/20/21 1111       Quality of Life   Select Quality of Life      Quality of Life Scores   Health/Function Pre 11.97 %    Socioeconomic Pre 28.17 %    Psych/Spiritual Pre 19.5 %    Family Pre 21 %    GLOBAL Pre 17.78 %            Scores of 19 and below usually indicate a poorer quality of life in these areas.  A difference of  2-3 points is a clinically meaningful difference.  A difference of 2-3 points in the total score of the Quality of Life Index has been associated with significant improvement in overall quality of life, self-image, physical symptoms, and general health in studies assessing change in quality of life.   PHQ-9: Recent Review Flowsheet Data     Depression screen [facility] 2/9 03/20/2021 02/06/2020 12/14/2018   Decreased Interest 1 2 0   Down, Depressed, Hopeless 1 2 0   PHQ - 2 Score 2 4 0   Altered sleeping 0 2 -   Tired, decreased energy 2 2 -   Change in appetite 1 2 -   Feeling bad or failure about yourself  0 1 -   Trouble concentrating 1 1 -   Moving slowly or fidgety/restless 0 0 -   Suicidal thoughts 0 0 -   PHQ-9 Score 6 12 -   Difficult doing work/chores Somewhat difficult - -      Interpretation of Total Score  Total Score Depression Severity:  1-4 = Minimal depression, 5-9 = Mild depression, 10-14 = Moderate depression, 15-19 = Moderately severe depression, 20-27 = Severe depression   Psychosocial Evaluation and Intervention:  Psychosocial Evaluation - 03/20/21 1113       Psychosocial Evaluation & Interventions   Interventions Stress management education;Relaxation education;Encouraged to exercise with the program and follow exercise prescription    Comments Pt has no barriers to completing pulmonary rehab. She does have anxiety which is treated with clonazepam 0.5 mg PRN. She reports that this does help "take the edge off".  She did score a 6 on her PHQ-9. A lot of this she attributes to her health. She has had COVID-19 3 times in the past 4 months and this has caused her to be SOB with minimal exertion and fatigue easily. She also has several autoimmune diseases that attribute to this and celiac disease. She also reports her health as a source of stress. 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 He has suffered 2 strokes in the past couple of years and this has caused him to experience frequent confusion, agitation, and has impacted some of his motor skills and vision. She reports this to be very stressful and at times overwhelming. She does have neighbors that she socializes with, but does not bring her problems to them as she feels that they would overwhelm them. Her main support system is her sister, but she lives in [place], so they can only communicate on the phone. She  has made an appointment with [facility] in [place] to talk with a therapist. She is hopeful that this will be helpful for her. Her goals while in the program are to lose weight, improve her SOB, and to improve her overall well-being. She is excited about starting the program and is thankful to be able to do it.    Expected Outcomes Pt's anxiety will continue to be treated with clonazepam and she will see a therapist to reduce some of her stress. She will have no other identifiable psychosocial issues.    Continue Psychosocial Services  Follow up required by staff             Psychosocial Re-Evaluation:   Psychosocial Discharge (Final Psychosocial Re-Evaluation):    Education: Education Goals: Education classes will be provided on a weekly basis, covering required topics. Participant will state understanding/return demonstration of topics presented.  Learning Barriers/Preferences:  Learning Barriers/Preferences - 03/20/21 0905       Learning Barriers/Preferences   Learning Barriers None    Learning  Preferences Skilled Demonstration             Education Topics: How Lungs Work and Diseases: - Discuss the anatomy of the lungs and diseases that can affect the lungs, such as COPD.   Exercise: -Discuss the importance of exercise, FITT principles of exercise, normal and abnormal responses to exercise, and how to exercise safely.   Environmental Irritants: -Discuss types of environmental irritants and how to limit exposure to environmental irritants.   Meds/Inhalers and oxygen: - Discuss respiratory medications, definition of an inhaler and oxygen, and the proper way to use an inhaler and oxygen.   Energy Saving Techniques: - Discuss methods to conserve energy and decrease shortness of breath when performing activities of daily living.    Bronchial Hygiene / Breathing Techniques: - Discuss breathing mechanics, pursed-lip breathing technique,  proper posture, effective ways to clear airways, and other functional breathing techniques   Cleaning Equipment: - Provides group verbal and written instruction about the health risks of elevated stress, cause of high stress, and healthy ways to reduce stress.   Nutrition I: Fats: - Discuss the types of cholesterol, what cholesterol does to the body, and how cholesterol levels can be controlled.   Nutrition II: Labels: -Discuss the different components of food labels and how to read food labels.   Respiratory Infections: - Discuss the signs and symptoms of respiratory infections, ways to prevent respiratory infections, and the importance of seeking medical treatment when having a respiratory infection.   Stress I: Signs and Symptoms: - Discuss the causes of stress, how stress may lead to anxiety and depression, and ways to limit stress.   Stress II: Relaxation: -Discuss relaxation techniques to limit stress.   Oxygen for Home/Travel: - Discuss how to prepare for travel when on oxygen and proper ways to transport and store  oxygen to ensure safety.   Knowledge Questionnaire Score:  Knowledge Questionnaire Score - 03/20/21 0905       Knowledge Questionnaire Score   Pre Score 15/18             Core Components/Risk Factors/Patient Goals at Admission:  Personal Goals and Risk Factors at Admission - 03/20/21 0908       Core Components/Risk Factors/Patient Goals on Admission    Weight Management Yes;Weight Loss;Obesity    Intervention Weight Management: Develop a combined nutrition and exercise program designed to reach desired caloric intake, while maintaining appropriate intake of nutrient and fiber, sodium and  fats, and appropriate energy expenditure required for the weight goal.;Weight Management: Provide education and appropriate resources to help participant work on and attain dietary goals.;Weight Management/Obesity: Establish reasonable short term and long term weight goals.;Obesity: Provide education and appropriate resources to help participant work on and attain dietary goals.    Expected Outcomes Short Term: Continue to assess and modify interventions until short term weight is achieved;Long Term: Adherence to nutrition and physical activity/exercise program aimed toward attainment of established weight goal;Weight Loss: Understanding of general recommendations for a balanced deficit meal plan, which promotes 1-2 lb weight loss per week and includes a negative energy balance of [phone] kcal/d;Understanding recommendations for meals to include 15-35% energy as protein, 25-35% energy from fat, 35-60% energy from carbohydrates, less than 200mg  of dietary cholesterol, 20-35 gm of total fiber daily;Understanding of distribution of calorie intake throughout the day with the consumption of 4-5 meals/snacks;Weight Maintenance: Understanding of the daily nutrition guidelines, which includes 25-35% calories from fat, 7% or less cal from saturated fats, less than 200mg  cholesterol, less than 1.5gm of sodium, & 5 or  more servings of fruits and vegetables daily    Improve shortness of breath with ADL's Yes    Intervention Provide education, individualized exercise plan and daily activity instruction to help decrease symptoms of SOB with activities of daily living.    Expected Outcomes Short Term: Improve cardiorespiratory fitness to achieve a reduction of symptoms when performing ADLs;Long Term: Be able to perform more ADLs without symptoms or delay the onset of symptoms    Personal Goal Other Yes    Personal Goal Improve well being overall    Intervention Attend pulmonary rehab, begin home exercise, educational materials reviewed, social support given at rehab.    Expected Outcomes Her overall well being will be improved.             Core Components/Risk Factors/Patient Goals Review:    Core Components/Risk Factors/Patient Goals at Discharge (Final Review):    ITP Comments:   Comments: Patient arrived for 1st visit/orientation/education at 0800. Patient was referred to [facility] by Dr. Halford Chessman due to DOE (R06.09); Post covid-19 condition (U09.9), unspecified . During orientation advised patient on arrival and appointment times what to wear, what to do before, during and after exercise. Reviewed attendance and class policy.  Pt is scheduled to return Pulmonary Rehab on 03/25/2021 at 1045. Pt was advised to come to class 15 minutes before class starts.  Discussed RPE/Dpysnea scales. Patient participated in warm up stretches. Patient was able to complete 6 minute walk test. Patient was measured for the equipment. Discussed equipment safety with patient. Took patient pre-anthropometric measurements. Patient finished visit at [place].

## 2021-03-21 ENCOUNTER — Ambulatory Visit (HOSPITAL_COMMUNITY): Payer: Medicare Other

## 2021-03-25 ENCOUNTER — Encounter (HOSPITAL_COMMUNITY)
Admission: RE | Admit: 2021-03-25 | Discharge: 2021-03-25 | Disposition: A | Discharge status: Home / Self Care | Payer: Medicare Other | Source: Ambulatory Visit | Attending: Pulmonary Disease | Admitting: Pulmonary Disease

## 2021-03-25 DIAGNOSIS — R0609 Other forms of dyspnea: Secondary | ICD-10-CM | POA: Diagnosis not present

## 2021-03-25 DIAGNOSIS — U099 Post covid-19 condition, unspecified: Secondary | ICD-10-CM

## 2021-03-25 NOTE — Progress Notes (Signed)
Daily Session Note  Patient Details  Name: Katie Davis MRN: [id number] Date of Birth: 1956-02-07 Referring Provider:   Flowsheet Row PULMONARY REHAB OTHER RESP ORIENTATION from 03/20/2021 in [place]  Referring Provider Dr. Halford Chessman       Encounter Date: 03/25/2021  Check In:  Session Check In - 03/25/21 1045       Check-In   Supervising physician immediately available to respond to emergencies [facility] MD immediately available    Physician(s) Dr. Domenic Polite    Location [facility]    Staff Present Hoy Register, MS, ACSM-CEP, Exercise Physiologist;Heather Zigmund Daniel, Exercise Physiologist    Virtual Visit No    Medication changes reported     No    Fall or balance concerns reported    No    Tobacco Cessation No Change    Warm-up and Cool-down Performed as group-led instruction    Resistance Training Performed Yes    VAD Patient? No    PAD/SET Patient? No      Pain Assessment   Currently in Pain? No/denies    Multiple Pain Sites No             Capillary Blood Glucose: No results found for this or any previous visit (from the past 24 hour(s)).    Social History   Tobacco Use  Smoking Status Never  Smokeless Tobacco Never    Goals Met:  Independence with exercise equipment Exercise tolerated well No report of concerns or symptoms today Strength training completed today  Goals Unmet:  Not Applicable  Comments: checkout time is 1145   Dr. Kathie Dike is Medical Director for [facility] Pulmonary Rehab.

## 2021-03-26 ENCOUNTER — Encounter (HOSPITAL_COMMUNITY): Payer: Medicare Other | Admitting: Occupational Therapy

## 2021-03-26 ENCOUNTER — Ambulatory Visit: Payer: Medicare Other | Admitting: Cardiology

## 2021-03-27 ENCOUNTER — Encounter (HOSPITAL_COMMUNITY)
Admission: RE | Admit: 2021-03-27 | Discharge: 2021-03-27 | Disposition: A | Discharge status: Home / Self Care | Payer: Medicare Other | Source: Ambulatory Visit | Attending: Pulmonary Disease | Admitting: Pulmonary Disease

## 2021-03-27 DIAGNOSIS — U099 Post covid-19 condition, unspecified: Secondary | ICD-10-CM

## 2021-03-27 DIAGNOSIS — R0609 Other forms of dyspnea: Secondary | ICD-10-CM | POA: Diagnosis not present

## 2021-03-27 NOTE — Progress Notes (Signed)
Daily Session Note  Patient Details  Name: Katie Davis MRN: [id number] Date of Birth: 02-23-1956 Referring Provider:   Flowsheet Row PULMONARY REHAB OTHER RESP ORIENTATION from 03/20/2021 in [place]  Referring Provider Dr. Halford Chessman       Encounter Date: 03/27/2021  Check In:  Session Check In - 03/27/21 1045       Check-In   Supervising physician immediately available to respond to emergencies [facility] immediately available    Physician(s) Dr. Domenic Polite    Location [facility]    Staff Present Hoy Register, MS, ACSM-CEP, Exercise Physiologist;Heather Zigmund Daniel, Exercise Physiologist    Virtual Visit No    Medication changes reported     No    Fall or balance concerns reported    No    Tobacco Cessation No Change    Warm-up and Cool-down Performed as group-led instruction    Resistance Training Performed Yes    VAD Patient? No    PAD/SET Patient? No      Pain Assessment   Currently in Pain? No/denies    Pain Score 0-No pain    Multiple Pain Sites No             Capillary Blood Glucose: No results found for this or any previous visit (from the past 24 hour(s)).    Social History   Tobacco Use  Smoking Status Never  Smokeless Tobacco Never    Goals Met:  Independence with exercise equipment Exercise tolerated well No report of concerns or symptoms today Strength training completed today  Goals Unmet:  Not Applicable  Comments: checkout time is 1145   Dr. Kathie Dike is Medical Director for [facility] Pulmonary Rehab.

## 2021-04-01 ENCOUNTER — Encounter (HOSPITAL_COMMUNITY)
Admission: RE | Admit: 2021-04-01 | Discharge: 2021-04-01 | Disposition: A | Discharge status: Home / Self Care | Payer: Medicare Other | Source: Ambulatory Visit | Attending: Pulmonary Disease | Admitting: Pulmonary Disease

## 2021-04-01 VITALS — Wt 175.3 lb

## 2021-04-01 DIAGNOSIS — U099 Post covid-19 condition, unspecified: Secondary | ICD-10-CM | POA: Diagnosis not present

## 2021-04-01 DIAGNOSIS — R0609 Other forms of dyspnea: Secondary | ICD-10-CM

## 2021-04-01 NOTE — Progress Notes (Signed)
Daily Session Note  Patient Details  Name: Katie Davis MRN: [id number] Date of Birth: 08/24/1955 Referring Provider:   Flowsheet Row PULMONARY REHAB OTHER RESP ORIENTATION from 03/20/2021 in [place]  Referring Provider Dr. Halford Chessman       Encounter Date: 04/01/2021  Check In:  Session Check In - 04/01/21 1045       Check-In   Supervising physician immediately available to respond to emergencies [facility] immediately available    Physician(s) Dr. Harl Bowie    Location [facility]    Staff Present Hoy Register, MS, ACSM-CEP, Exercise Physiologist;Heather Zigmund Daniel, Exercise Physiologist;Other    Virtual Visit No    Medication changes reported     No    Fall or balance concerns reported    No    Tobacco Cessation No Change    Warm-up and Cool-down Performed as group-led instruction    Resistance Training Performed Yes    VAD Patient? No    PAD/SET Patient? No      Pain Assessment   Currently in Pain? No/denies    Pain Score 0-No pain    Multiple Pain Sites No             Capillary Blood Glucose: No results found for this or any previous visit (from the past 24 hour(s)).    Social History   Tobacco Use  Smoking Status Never  Smokeless Tobacco Never    Goals Met:  Independence with exercise equipment Exercise tolerated well No report of concerns or symptoms today Strength training completed today  Goals Unmet:  Not Applicable  Comments: checkout time is 1145   Dr. Kathie Dike is Medical Director for [facility] Pulmonary Rehab.

## 2021-04-02 ENCOUNTER — Encounter (HOSPITAL_COMMUNITY): Payer: Medicare Other | Admitting: Occupational Therapy

## 2021-04-03 ENCOUNTER — Other Ambulatory Visit: Payer: Medicare Other | Admitting: Women's Health

## 2021-04-03 ENCOUNTER — Encounter (HOSPITAL_COMMUNITY)
Admission: RE | Admit: 2021-04-03 | Discharge: 2021-04-03 | Disposition: A | Discharge status: Home / Self Care | Payer: Medicare Other | Source: Ambulatory Visit | Attending: Pulmonary Disease | Admitting: Pulmonary Disease

## 2021-04-03 DIAGNOSIS — U099 Post covid-19 condition, unspecified: Secondary | ICD-10-CM | POA: Diagnosis not present

## 2021-04-03 DIAGNOSIS — R0609 Other forms of dyspnea: Secondary | ICD-10-CM

## 2021-04-03 NOTE — Progress Notes (Signed)
Daily Session Note  Patient Details  Name: Katie Davis MRN: [id number] Date of Birth: [birth year] Referring Provider:   Flowsheet Row PULMONARY REHAB OTHER RESP ORIENTATION from 03/20/2021 in [place]  Referring Provider Dr. Halford Chessman       Encounter Date: 04/03/2021  Check In:  Session Check In - 04/03/21 1045       Check-In   Supervising physician immediately available to respond to emergencies [facility] immediately available    Physician(s) Dr. Harl Bowie    Location [facility]    Staff Present Geanie Cooley, RN;Heather Otho Ket, BS, Exercise Physiologist    Virtual Visit No    Medication changes reported     No    Fall or balance concerns reported    No    Tobacco Cessation No Change    Warm-up and Cool-down Performed as group-led instruction    Resistance Training Performed Yes    VAD Patient? No    PAD/SET Patient? No      Pain Assessment   Currently in Pain? No/denies    Pain Score 0-No pain    Multiple Pain Sites No             Capillary Blood Glucose: No results found for this or any previous visit (from the past 24 hour(s)).    Social History   Tobacco Use  Smoking Status Never  Smokeless Tobacco Never    Goals Met:  Proper associated with RPD/PD & O2 Sat Independence with exercise equipment Exercise tolerated well No report of concerns or symptoms today Strength training completed today  Goals Unmet:  Not Applicable  Comments: check out @ 11:45am   Dr. Kathie Dike is Medical Director for [facility].

## 2021-04-04 ENCOUNTER — Other Ambulatory Visit: Payer: Self-pay

## 2021-04-04 ENCOUNTER — Encounter: Payer: Self-pay | Admitting: Cardiology

## 2021-04-04 ENCOUNTER — Ambulatory Visit: Payer: Medicare Other | Admitting: Cardiology

## 2021-04-04 VITALS — BP 132/88 | HR 76 | Ht 63.0 in | Wt 173.0 lb

## 2021-04-04 DIAGNOSIS — I447 Left bundle-branch block, unspecified: Secondary | ICD-10-CM

## 2021-04-04 DIAGNOSIS — R0609 Other forms of dyspnea: Secondary | ICD-10-CM | POA: Diagnosis not present

## 2021-04-04 DIAGNOSIS — I2510 Atherosclerotic heart disease of native coronary artery without angina pectoris: Secondary | ICD-10-CM

## 2021-04-04 MED ORDER — ASPIRIN EC 81 MG PO TBEC: 81.0000 mg | DELAYED_RELEASE_TABLET | Freq: Every day | ORAL | 3 refills | Status: AC

## 2021-04-04 NOTE — Patient Instructions (Signed)
Medication Instructions:  START Aspirin 81 mg daily   Testing/Procedures: Your physician has requested that you have a lexiscan myoview. For further information please visit [email or web address]. Please follow instruction sheet, as given.   Follow-Up:   To be determined after lexiscan

## 2021-04-04 NOTE — Progress Notes (Signed)
Clinical Summary Ms. Crull is a 66 y.o.female seen today as a new consult, referred by Dr Nevada Crane for the following medical problems.   1.Coronary artery calcifications/atherosclerosis - noted by 02/2021 chest CT - no chest pains. SOB is improving with resolving COVID infection.  CAD risk factor: hyperlipidemia - she is already on statin   2. Abnormal CT scan - 02/2021 CT scan showed enlarged pulmonic trunk, suggesting pulm HTN - 02/2021 echo LVEF 0000000, normal diastolic, normal RV   3. Interstitial lung disease - followed by pulmonary -   4. Severe persistent asthma - followed by pulmonary  5. LBBB - unclear chronicity, no prior EKGs in system -  02/2021 echo LVEF 0000000, normal diastolic, normal RV  6. Hyperlipidemia - LDL 01/2021 100  Past Medical History:  Diagnosis Date   Abnormal glandular Papanicolaou smear of cervix    Anxiety    Asthma    Body mass index 30.0-30.9, adult    Celiac disease    Chronic vascular disorder of intestine (HCC)    Fibromyalgia    Graves disease    Headache    Migrains   Hyperlipidemia    Hypertension    Hypothyroidism    IBS (irritable bowel syndrome) With constipation    Lupus (systemic lupus erythematosus) (HCC)    Obesity, unspecified    Osteoporosis    Raynaud disease    Sjogren's disease (HCC)      Allergies  Allergen Reactions   Prednisone Palpitations   Azithromycin     Other reaction(s): vomiting   Bactrim [Sulfamethoxazole-Trimethoprim] Nausea And Vomiting   Codeine     Other reaction(s): vomiting   Denosumab     Other reaction(s): Unknown   Doxycycline     Made body feel crazy, increased heart rate   Gluten Meal     Ceiliac disease    Keflex [Cephalexin]    Latex Hives   Levaquin [Levofloxacin] Nausea And Vomiting   Sulfa Antibiotics Nausea And Vomiting   Contrast Media [Iodinated Contrast Media] Nausea And Vomiting and Rash    Per patient, did fine with premedication with IV steroids before       Current Outpatient Medications  Medication Sig Dispense Refill   acidophilus (RISAQUAD) CAPS capsule Take 1 capsule by mouth daily.     albuterol (VENTOLIN HFA) 108 (90 Base) MCG/ACT inhaler Inhale 1-2 puffs into the lungs every 6 (six) hours as needed for wheezing or shortness of breath.     atenolol (TENORMIN) 50 MG tablet Take 50 mg by mouth 2 (two) times daily.      calcium carbonate (TUMS - DOSED IN MG ELEMENTAL CALCIUM) 500 MG chewable tablet Chew 1 tablet by mouth daily.     Cholecalciferol (VITAMIN D3 GUMMIES) 25 MCG (1000 UT) CHEW Chew 6,000 Units by mouth daily.     clonazePAM (KLONOPIN) 0.5 MG tablet Take 0.25-0.5 mg by mouth 2 (two) times daily as needed for anxiety.      cyclobenzaprine (FLEXERIL) 10 MG tablet Take 10 mg by mouth 3 (three) times daily as needed for muscle spasms.      Diclofenac-miSOPROStol 50-0.2 MG TBEC Take 1 tablet by mouth 2 (two) times daily. (Patient not taking: Reported on 03/20/2021) 60 tablet 0   ELDERBERRY PO Take 1 capsule by mouth daily.     fluticasone furoate-vilanterol (BREO ELLIPTA) 100-25 MCG/ACT AEPB Inhale 1 puff into the lungs daily.     Fluticasone-Umeclidin-Vilant (TRELEGY ELLIPTA) 100-62.5-25 MCG/ACT AEPB Inhale 1 puff into the  lungs daily in the afternoon. (Patient not taking: Reported on 03/20/2021) 60 each 5   Fluticasone-Umeclidin-Vilant (TRELEGY ELLIPTA) 100-62.5-25 MCG/ACT AEPB Inhale 1 puff into the lungs daily. 60 each 0   ibuprofen (ADVIL,MOTRIN) 800 MG tablet Take 800 mg by mouth every 8 (eight) hours as needed for moderate pain.     linaclotide (LINZESS) 145 MCG CAPS capsule Take 1 capsule (145 mcg total) by mouth daily before breakfast. 30 capsule 3   meclizine (ANTIVERT) 12.5 MG tablet Take 12.5-25 mg by mouth every 6 (six) hours as needed for dizziness.      Omega-3 Fatty Acids (FISH OIL) 1000 MG CAPS Take 2,000 mg by mouth daily.     ondansetron (ZOFRAN) 4 MG tablet Take 1 tablet (4 mg total) by mouth every 8 (eight) hours as  needed for nausea or vomiting. 12 tablet 0   oxyCODONE-acetaminophen (PERCOCET/ROXICET) 5-325 MG tablet Take 1 tablet by mouth 2 (two) times daily as needed for moderate pain or severe pain.      pantoprazole (PROTONIX) 40 MG tablet Take 1 tablet (40 mg total) by mouth daily before breakfast. 30 tablet 3   RESTASIS 0.05 % ophthalmic emulsion Place 1 drop into both eyes 2 (two) times daily.     rosuvastatin (CRESTOR) 5 MG tablet Take 5 mg by mouth 4 (four) times a week.     SYNTHROID 112 MCG tablet Take 112 mcg by mouth. Takes 5 days a week.     No current facility-administered medications for this visit.     Past Surgical History:  Procedure Laterality Date   APPENDECTOMY     CHOLECYSTECTOMY     COLONOSCOPY WITH PROPOFOL N/A 06/10/2020   Surgeon: Eloise Harman, DO;  diverticulosis and nonbleeding internal hemorrhoids, due for repeat in 2032.   OTHER SURGICAL HISTORY     biopsies on both feet   REDUCTION MAMMAPLASTY       Allergies  Allergen Reactions   Prednisone Palpitations   Azithromycin     Other reaction(s): vomiting   Bactrim [Sulfamethoxazole-Trimethoprim] Nausea And Vomiting   Codeine     Other reaction(s): vomiting   Denosumab     Other reaction(s): Unknown   Doxycycline     Made body feel crazy, increased heart rate   Gluten Meal     Ceiliac disease    Keflex [Cephalexin]    Latex Hives   Levaquin [Levofloxacin] Nausea And Vomiting   Sulfa Antibiotics Nausea And Vomiting   Contrast Media [Iodinated Contrast Media] Nausea And Vomiting and Rash    Per patient, did fine with premedication with IV steroids before       Family History  Problem Relation Age of Onset   Congestive Heart Failure Mother    Aneurysm Father    Celiac disease Brother    Melanoma Brother    Aneurysm Brother        stomach   Stroke Sister    Celiac disease Sister    Irritable bowel syndrome Sister    Celiac disease Brother    Aneurysm Sister    Other Sister        multiple  back surgeries   Colon cancer Neg Hx      Social History Ms. Gibbon reports that she has never smoked. She has never used smokeless tobacco. Ms. Channel reports that she does not currently use alcohol.   Review of Systems CONSTITUTIONAL: No weight loss, fever, chills, weakness or fatigue.  HEENT: Eyes: No visual loss, blurred vision, double  vision or yellow sclerae.No hearing loss, sneezing, congestion, runny nose or sore throat.  SKIN: No rash or itching.  CARDIOVASCULAR: per hpi RESPIRATORY: per hpi GASTROINTESTINAL: No anorexia, nausea, vomiting or diarrhea. No abdominal pain or blood.  GENITOURINARY: No burning on urination, no polyuria NEUROLOGICAL: No headache, dizziness, syncope, paralysis, ataxia, numbness or tingling in the extremities. No change in bowel or bladder control.  MUSCULOSKELETAL: No muscle, back pain, joint pain or stiffness.  LYMPHATICS: No enlarged nodes. No history of splenectomy.  PSYCHIATRIC: No history of depression or anxiety.  ENDOCRINOLOGIC: No reports of sweating, cold or heat intolerance. No polyuria or polydipsia.  Marland Kitchen   Physical Examination Today's Vitals   04/04/21 1040  BP: 132/88  Pulse: 76  SpO2: 98%  Weight: 173 lb (78.5 kg)  Height: 5\' 3"  (1.6 m)   Body mass index is 30.65 kg/m.  Gen: resting comfortably, no acute distress HEENT: no scleral icterus, pupils equal round and reactive, no palptable cervical adenopathy,  CV: RRR, no m/rg, no jvd Resp: Clear to auscultation bilaterally GI: abdomen is soft, non-tender, non-distended, normal bowel sounds, no hepatosplenomegaly MSK: extremities are warm, no edema.  Skin: warm, no rash Neuro:  no focal deficits Psych: appropriate affect     Assessment and Plan  1.Coronary atherosclerosis/LBBB - coronary atherosclerosis noted on CT scan - denies chest pain, chronic SOB related to lung disease though improving - LBBB on EKG unkonwn chonicity - would plan for lexiscan to functional  evaluation her coronary atherosclerosis  EKG today shows SR, LBBB unknown chronicity.       Arnoldo Lenis, M.D.

## 2021-04-08 ENCOUNTER — Encounter (HOSPITAL_COMMUNITY)
Admission: RE | Admit: 2021-04-08 | Discharge: 2021-04-08 | Disposition: A | Discharge status: Home / Self Care | Payer: Medicare Other | Source: Ambulatory Visit | Attending: Pulmonary Disease | Admitting: Pulmonary Disease

## 2021-04-08 ENCOUNTER — Telehealth: Payer: Self-pay

## 2021-04-08 ENCOUNTER — Other Ambulatory Visit: Payer: Self-pay

## 2021-04-08 DIAGNOSIS — U099 Post covid-19 condition, unspecified: Secondary | ICD-10-CM

## 2021-04-08 DIAGNOSIS — R0609 Other forms of dyspnea: Secondary | ICD-10-CM

## 2021-04-08 NOTE — Progress Notes (Signed)
Daily Session Note  Patient Details  Name: Katie Davis MRN: [id number] Date of Birth: August 21, 1955 Referring Provider:   Flowsheet Row PULMONARY REHAB OTHER RESP ORIENTATION from 03/20/2021 in [place]  Referring Provider Dr. Halford Chessman       Encounter Date: 04/08/2021  Check In:  Session Check In - 04/08/21 1045       Check-In   Supervising physician immediately available to respond to emergencies [facility] MD immediately available    Physician(s) Dr. Johney Frame    Location [facility]    Staff Present Redge Gainer, BS, Exercise Physiologist;Cashus Halterman Kris Mouton, MS, ACSM-CEP, Exercise Physiologist;Phyllis Billingsley, RN    Virtual Visit No    Medication changes reported     No    Fall or balance concerns reported    No    Tobacco Cessation No Change    Warm-up and Cool-down Performed as group-led instruction    Resistance Training Performed Yes    VAD Patient? No    PAD/SET Patient? No      Pain Assessment   Currently in Pain? No/denies    Pain Score 0-No pain    Multiple Pain Sites No             Capillary Blood Glucose: No results found for this or any previous visit (from the past 24 hour(s)).    Social History   Tobacco Use  Smoking Status Never  Smokeless Tobacco Never    Goals Met:  Independence with exercise equipment Exercise tolerated well No report of concerns or symptoms today Strength training completed today  Goals Unmet:  Not Applicable  Comments: checkout time is 1145   Dr. Kathie Dike is Medical Director for [facility] Pulmonary Rehab.

## 2021-04-08 NOTE — Telephone Encounter (Signed)
Patient stopped by office to say she was told in 2006 in [place] where she had lived that she had a LBBB then.    I will FYI Dr.Branch

## 2021-04-08 NOTE — Telephone Encounter (Signed)
Thanks for update. Given findings on CT scan would still plan for stress testing  JBranch MD

## 2021-04-09 ENCOUNTER — Encounter (HOSPITAL_COMMUNITY): Payer: Medicare Other | Admitting: Occupational Therapy

## 2021-04-10 ENCOUNTER — Telehealth: Payer: Self-pay | Admitting: Pulmonary Disease

## 2021-04-10 ENCOUNTER — Encounter (HOSPITAL_COMMUNITY)
Admission: RE | Admit: 2021-04-10 | Discharge: 2021-04-10 | Disposition: A | Discharge status: Home / Self Care | Payer: Medicare Other | Source: Ambulatory Visit | Attending: Pulmonary Disease | Admitting: Pulmonary Disease

## 2021-04-10 DIAGNOSIS — U099 Post covid-19 condition, unspecified: Secondary | ICD-10-CM | POA: Diagnosis not present

## 2021-04-10 DIAGNOSIS — R0609 Other forms of dyspnea: Secondary | ICD-10-CM | POA: Diagnosis not present

## 2021-04-10 NOTE — Progress Notes (Signed)
Daily Session Note  Patient Details  Name: Katie Davis MRN: [id number] Date of Birth: 1955/10/16 Referring Provider:   Flowsheet Row PULMONARY REHAB OTHER RESP ORIENTATION from 03/20/2021 in [place]  Referring Provider Dr. Halford Chessman       Encounter Date: 04/10/2021  Check In:  Session Check In - 04/10/21 1045       Check-In   Supervising physician immediately available to respond to emergencies [facility] MD immediately available    Physician(s) Dr. Domenic Polite    Location [facility]    Staff Present Aundra Dubin, RN, BSN;Heather Otho Ket, BS, Exercise Physiologist;Adaley Kiene Kris Mouton, MS, ACSM-CEP, Exercise Physiologist    Virtual Visit No    Medication changes reported     No    Fall or balance concerns reported    No    Tobacco Cessation No Change    Warm-up and Cool-down Performed as group-led instruction    Resistance Training Performed Yes    VAD Patient? No    PAD/SET Patient? No      Pain Assessment   Currently in Pain? No/denies    Pain Score 0-No pain    Multiple Pain Sites No             Capillary Blood Glucose: No results found for this or any previous visit (from the past 24 hour(s)).    Social History   Tobacco Use  Smoking Status Never  Smokeless Tobacco Never    Goals Met:  Independence with exercise equipment Exercise tolerated well No report of concerns or symptoms today Strength training completed today  Goals Unmet:  Not Applicable  Comments: checkout time is 1145   Dr. Kathie Dike is Medical Director for [facility] Pulmonary Rehab.

## 2021-04-11 ENCOUNTER — Other Ambulatory Visit (HOSPITAL_COMMUNITY): Payer: Medicare Other

## 2021-04-11 NOTE — Telephone Encounter (Signed)
Called and spoke with pt who states that she has been in rehab for about 3 weeks now. Pt said that she is still feeling very fatigued and wanted to know if there was anything that she could either do or take to help her get her energy back. Pt said that she did have Covid multiple times and I stated to her that it does take time to fully get energy back and get her feeling back to baseline. Stated to pt that we could schedule her for a video visit with VS when he is at the [facility] office so she can get any questions answered that she has and she verbalized understanding. Appt has been scheduled for pt with VS 1/31 for a video visit. Nothing further needed.

## 2021-04-14 ENCOUNTER — Other Ambulatory Visit: Payer: Self-pay

## 2021-04-14 ENCOUNTER — Ambulatory Visit (HOSPITAL_COMMUNITY)
Admission: RE | Admit: 2021-04-14 | Discharge: 2021-04-14 | Disposition: A | Discharge status: Home / Self Care | Payer: Medicare Other | Source: Ambulatory Visit | Attending: Cardiology | Admitting: Cardiology

## 2021-04-14 ENCOUNTER — Encounter: Payer: Self-pay | Admitting: Cardiology

## 2021-04-14 ENCOUNTER — Encounter (HOSPITAL_COMMUNITY): Payer: Self-pay

## 2021-04-14 ENCOUNTER — Encounter (HOSPITAL_COMMUNITY)
Admission: RE | Admit: 2021-04-14 | Discharge: 2021-04-14 | Disposition: A | Discharge status: Home / Self Care | Payer: Medicare Other | Source: Ambulatory Visit | Attending: Cardiology | Admitting: Cardiology

## 2021-04-14 DIAGNOSIS — R0609 Other forms of dyspnea: Secondary | ICD-10-CM | POA: Diagnosis not present

## 2021-04-14 LAB — NM MYOCAR MULTI W/SPECT W/WALL MOTION / EF
LV dias vol: 60 mL (ref 46–106)
LV sys vol: 23 mL
Nuc Stress EF: 62 %
Peak HR: 98 {beats}/min
RATE: 0.3
Rest HR: 70 {beats}/min
Rest Nuclear Isotope Dose: 9.8 mCi
SDS: 3
SRS: 6
SSS: 9
ST Depression (mm): 0 mm
Stress Nuclear Isotope Dose: 29.9 mCi
TID: 1.11

## 2021-04-14 MED ORDER — SODIUM CHLORIDE FLUSH 0.9 % IV SOLN
INTRAVENOUS | Status: AC
  Administered 2021-04-14: 10 mL via INTRAVENOUS
  Filled 2021-04-14: qty 10

## 2021-04-14 MED ORDER — TECHNETIUM TC 99M TETROFOSMIN IV KIT
30.0000 | PACK | Freq: Once | INTRAVENOUS | Status: AC | PRN
  Administered 2021-04-14: 29.9 via INTRAVENOUS

## 2021-04-14 MED ORDER — REGADENOSON 0.4 MG/5ML IV SOLN
INTRAVENOUS | Status: AC
  Administered 2021-04-14: 0.4 mg via INTRAVENOUS
  Filled 2021-04-14: qty 5

## 2021-04-14 MED ORDER — TECHNETIUM TC 99M TETROFOSMIN IV KIT
10.0000 | PACK | Freq: Once | INTRAVENOUS | Status: AC | PRN
  Administered 2021-04-14: 9.8 via INTRAVENOUS

## 2021-04-15 ENCOUNTER — Encounter: Payer: Self-pay | Admitting: Pulmonary Disease

## 2021-04-15 ENCOUNTER — Encounter (HOSPITAL_COMMUNITY)
Admission: RE | Admit: 2021-04-15 | Discharge: 2021-04-15 | Disposition: A | Discharge status: Home / Self Care | Payer: Medicare Other | Source: Ambulatory Visit | Attending: Pulmonary Disease | Admitting: Pulmonary Disease

## 2021-04-15 ENCOUNTER — Telehealth (INDEPENDENT_AMBULATORY_CARE_PROVIDER_SITE_OTHER): Payer: Medicare Other | Admitting: Pulmonary Disease

## 2021-04-15 VITALS — Wt 174.2 lb

## 2021-04-15 DIAGNOSIS — R0609 Other forms of dyspnea: Secondary | ICD-10-CM

## 2021-04-15 DIAGNOSIS — Z8616 Personal history of COVID-19: Secondary | ICD-10-CM | POA: Diagnosis not present

## 2021-04-15 DIAGNOSIS — J849 Interstitial pulmonary disease, unspecified: Secondary | ICD-10-CM | POA: Diagnosis not present

## 2021-04-15 DIAGNOSIS — J4550 Severe persistent asthma, uncomplicated: Secondary | ICD-10-CM

## 2021-04-15 DIAGNOSIS — U099 Post covid-19 condition, unspecified: Secondary | ICD-10-CM

## 2021-04-15 NOTE — Patient Instructions (Signed)
Follow up in 2 months

## 2021-04-15 NOTE — Progress Notes (Signed)
Daily Session Note  Patient Details  Name: Katie Davis MRN: [id number] Date of Birth: 03-28-1955 Referring Provider:   Flowsheet Row PULMONARY REHAB OTHER RESP ORIENTATION from 03/20/2021 in [place]  Referring Provider Dr. Halford Chessman       Encounter Date: 04/15/2021  Check In:  Session Check In - 04/15/21 1045       Check-In   Supervising physician immediately available to respond to emergencies [facility] MD immediately available    Physician(s) Dr. Harl Bowie    Location [facility]    Virtual Visit No    Medication changes reported     No    Fall or balance concerns reported    No    Tobacco Cessation No Change    Warm-up and Cool-down Performed as group-led instruction    Resistance Training Performed Yes    VAD Patient? No    PAD/SET Patient? No      Pain Assessment   Currently in Pain? No/denies    Pain Score 0-No pain    Multiple Pain Sites No             Capillary Blood Glucose: No results found for this or any previous visit (from the past 24 hour(s)).    Social History   Tobacco Use  Smoking Status Never  Smokeless Tobacco Never    Goals Met:  Proper associated with RPD/PD & O2 Sat Independence with exercise equipment Exercise tolerated well No report of concerns or symptoms today Strength training completed today  Goals Unmet:  Not Applicable  Comments: checkout @ 11:45am   Dr. Kathie Dike is Medical Director for [facility] Pulmonary Rehab.

## 2021-04-15 NOTE — Progress Notes (Signed)
[facility]  Chief Complaint  Patient presents with   Follow-up    Rehab update, and wants [name] to be primary contact for covid health issues    Constitutional:  There were no vitals taken for this visit.  Past Medical History:  Anxiety, Celiac disease, Fibromyalgia, Grave's disease, HLD, Migraine headaches, HTN, IBS, Lupus, Osteoporosis, Raynaud disease, Sjogren's disease, COVID 19 August/September/October 2022  Past Surgical History:  She  has a past surgical history that includes Reduction mammaplasty; Cholecystectomy; Appendectomy; OTHER SURGICAL HISTORY; and Colonoscopy with propofol (N/A, 06/10/2020).  Brief Summary:  Katie Davis is a 66 y.o. female with asthma and history of COVID.      Subjective:   Virtual Visit via Video Note  I connected with [name] on 04/15/21 at  2:15 PM EST by a video enabled telemedicine application and verified that I am speaking with the correct person using two identifiers.  Location: Patient: home Provider: medical office   I discussed the limitations of evaluation and management by telemedicine and the availability of in person appointments. The patient expressed understanding and agreed to proceed.  History of Present Illness:  She has been concerned about how COVID will impact her long term.  She has been making progress with pulmonary rehab.  She feels her breathing is improving.  Breo helps.  She has nuclear stress test recently and was low risk.  She was asking about breathing exercises and supplements.  Physical Exam:   Deferred.   Pulmonary testing:  PFT 02/27/21 >> FEV1 1.95 (82%), FEV1% 83, TLC 4.75 (96%), DLCO 68%  Chest Imaging:  HRCT chest 03/05/21 >> enlarged PA, atherosclerosis, basilar and peripheral GGO with bronchiectasis, calcified granuloma LLL, air trapping  Social History:  She  reports that she has never smoked. She has never used smokeless tobacco. She  reports that she does not currently use alcohol. She reports that she does not use drugs.  Family History:  Her family history includes Aneurysm in her brother, father, and sister; Celiac disease in her brother, brother, and sister; Congestive Heart Failure in her mother; Irritable bowel syndrome in her sister; Melanoma in her brother; Other in her sister; Stroke in her sister.     Assessment/Plan:   Severe, persistent asthma. - continue breo 100 one puff daily - prn albuterol  Interstitial lung disease. - changes consistent with prior COVID 19 infections - explained this can take up to a year from her last COVID infection in November 2022 to get sense of what her new baseline will be - continue pulmonary rehab - discussed yoga breathing exercises - explained there is no definite evidence to support benefit of vitamin supplements, and defer to her to decide whether she would like to try an supplements such as turmeric  Grave's disease. - explained how thyroid dysfunction can impact her breathing and exercise tolerance - she is followed by Dr. Buddy Duty with Endocrinology  Time Spent Involved in Patient Care on Day of Examination:    I discussed the assessment and treatment plan with the patient. The patient was provided an opportunity to ask questions and all were answered. The patient agreed with the plan and demonstrated an understanding of the instructions.   The patient was advised to call back or seek an in-person evaluation if the symptoms worsen or if the condition fails to improve as anticipated.  I provided 26 minutes of non-face-to-face time during this encounter.  Follow up:   Patient Instructions  Follow up in 2 months  Medication List:   Allergies as of 04/15/2021       Reactions   Prednisone Palpitations   Azithromycin    Other reaction(s): vomiting   Bactrim [sulfamethoxazole-trimethoprim] Nausea And Vomiting   Codeine    Other reaction(s): vomiting    Denosumab    Other reaction(s): Unknown   Doxycycline    Made body feel crazy, increased heart rate   Gluten Meal    Ceiliac disease   Keflex [cephalexin]    Latex Hives   Levaquin [levofloxacin] Nausea And Vomiting   Sulfa Antibiotics Nausea And Vomiting   Contrast Media [iodinated Contrast Media] Nausea And Vomiting, Rash   Per patient, did fine with premedication with IV steroids before         Medication List        Accurate as of April 15, 2021  2:43 PM. If you have any questions, ask your nurse or doctor.          STOP taking these medications    Diclofenac-miSOPROStol 50-0.2 MG Tbec Stopped by: Coralyn Helling, MD   Trelegy Ellipta 100-62.5-25 MCG/ACT Aepb Generic drug: Fluticasone-Umeclidin-Vilant Stopped by: Coralyn Helling, MD       TAKE these medications    acidophilus Caps capsule Take 1 capsule by mouth daily.   albuterol 108 (90 Base) MCG/ACT inhaler Commonly known as: VENTOLIN HFA Inhale 1-2 puffs into the lungs every 6 (six) hours as needed for wheezing or shortness of breath.   aspirin EC 81 MG tablet Take 1 tablet (81 mg total) by mouth daily. Swallow whole.   atenolol 50 MG tablet Commonly known as: TENORMIN Take 50 mg by mouth 2 (two) times daily.   calcium carbonate 500 MG chewable tablet Commonly known as: TUMS - dosed in mg elemental calcium Chew 1 tablet by mouth daily.   clonazePAM 0.5 MG tablet Commonly known as: KLONOPIN Take 0.25-0.5 mg by mouth 2 (two) times daily as needed for anxiety.   cyclobenzaprine 10 MG tablet Commonly known as: FLEXERIL Take 10 mg by mouth 3 (three) times daily as needed for muscle spasms.   ELDERBERRY PO Take 1 capsule by mouth daily.   Fish Oil 1000 MG Caps Take 2,000 mg by mouth daily.   fluticasone furoate-vilanterol 100-25 MCG/ACT Aepb Commonly known as: BREO ELLIPTA Inhale 1 puff into the lungs daily.   ibuprofen 800 MG tablet Commonly known as: ADVIL Take 800 mg by mouth every 8  (eight) hours as needed for moderate pain.   linaclotide 145 MCG Caps capsule Commonly known as: Linzess Take 1 capsule (145 mcg total) by mouth daily before breakfast.   meclizine 12.5 MG tablet Commonly known as: ANTIVERT Take 12.5-25 mg by mouth every 6 (six) hours as needed for dizziness.   ondansetron 4 MG tablet Commonly known as: Zofran Take 1 tablet (4 mg total) by mouth every 8 (eight) hours as needed for nausea or vomiting.   oxyCODONE-acetaminophen 5-325 MG tablet Commonly known as: PERCOCET/ROXICET Take 1 tablet by mouth 2 (two) times daily as needed for moderate pain or severe pain.   pantoprazole 40 MG tablet Commonly known as: PROTONIX Take 1 tablet (40 mg total) by mouth daily before breakfast.   Restasis 0.05 % ophthalmic emulsion Generic drug: cycloSPORINE Place 1 drop into both eyes 2 (two) times daily.   rosuvastatin 5 MG tablet Commonly known as: CRESTOR Take 5 mg by mouth 4 (four) times a week.   Synthroid 112 MCG tablet Generic drug:  levothyroxine Take 112 mcg by mouth. Takes 5 days a week.   Vitamin D3 Gummies 25 MCG (1000 UT) Chew Generic drug: Cholecalciferol Chew 6,000 Units by mouth daily.        Signature:  Chesley Mires, MD [place] Pager - [phone] 04/15/2021, 2:43 PM

## 2021-04-16 ENCOUNTER — Encounter (HOSPITAL_COMMUNITY): Payer: Medicare Other | Admitting: Occupational Therapy

## 2021-04-16 NOTE — Progress Notes (Signed)
Pulmonary Individual Treatment Plan  Patient Details  Name: Katie Davis MRN: [id number] Date of Birth: [birth year] Referring Provider:   Flowsheet Row PULMONARY REHAB OTHER RESP ORIENTATION from 03/20/2021 in [place]  Referring Provider Dr. Halford Chessman       Initial Encounter Date:  Flowsheet Row PULMONARY REHAB OTHER RESP ORIENTATION from 03/20/2021 in [place]  Date 03/20/21       Visit Diagnosis: DOE (dyspnea on exertion)  Post covid-19 condition, unspecified  Patient's Home Medications on Admission:   Current Outpatient Medications:    acidophilus (RISAQUAD) CAPS capsule, Take 1 capsule by mouth daily., Disp: , Rfl:    albuterol (VENTOLIN HFA) 108 (90 Base) MCG/ACT inhaler, Inhale 1-2 puffs into the lungs every 6 (six) hours as needed for wheezing or shortness of breath., Disp: , Rfl:    aspirin EC 81 MG tablet, Take 1 tablet (81 mg total) by mouth daily. Swallow whole., Disp: 90 tablet, Rfl: 3   atenolol (TENORMIN) 50 MG tablet, Take 50 mg by mouth 2 (two) times daily. , Disp: , Rfl:    calcium carbonate (TUMS - DOSED IN MG ELEMENTAL CALCIUM) 500 MG chewable tablet, Chew 1 tablet by mouth daily., Disp: , Rfl:    Cholecalciferol (VITAMIN D3 GUMMIES) 25 MCG (1000 UT) CHEW, Chew 6,000 Units by mouth daily., Disp: , Rfl:    clonazePAM (KLONOPIN) 0.5 MG tablet, Take 0.25-0.5 mg by mouth 2 (two) times daily as needed for anxiety. , Disp: , Rfl:    cyclobenzaprine (FLEXERIL) 10 MG tablet, Take 10 mg by mouth 3 (three) times daily as needed for muscle spasms. , Disp: , Rfl:    ELDERBERRY PO, Take 1 capsule by mouth daily., Disp: , Rfl:    fluticasone furoate-vilanterol (BREO ELLIPTA) 100-25 MCG/ACT AEPB, Inhale 1 puff into the lungs daily., Disp: , Rfl:    ibuprofen (ADVIL,MOTRIN) 800 MG tablet, Take 800 mg by mouth every 8 (eight) hours as needed for moderate pain., Disp: , Rfl:    linaclotide (LINZESS) 145 MCG CAPS capsule, Take 1 capsule  (145 mcg total) by mouth daily before breakfast., Disp: 30 capsule, Rfl: 3   meclizine (ANTIVERT) 12.5 MG tablet, Take 12.5-25 mg by mouth every 6 (six) hours as needed for dizziness. , Disp: , Rfl:    Omega-3 Fatty Acids (FISH OIL) 1000 MG CAPS, Take 2,000 mg by mouth daily., Disp: , Rfl:    ondansetron (ZOFRAN) 4 MG tablet, Take 1 tablet (4 mg total) by mouth every 8 (eight) hours as needed for nausea or vomiting., Disp: 12 tablet, Rfl: 0   oxyCODONE-acetaminophen (PERCOCET/ROXICET) 5-325 MG tablet, Take 1 tablet by mouth 2 (two) times daily as needed for moderate pain or severe pain. , Disp: , Rfl:    pantoprazole (PROTONIX) 40 MG tablet, Take 1 tablet (40 mg total) by mouth daily before breakfast., Disp: 30 tablet, Rfl: 3   RESTASIS 0.05 % ophthalmic emulsion, Place 1 drop into both eyes 2 (two) times daily., Disp: , Rfl:    rosuvastatin (CRESTOR) 5 MG tablet, Take 5 mg by mouth 4 (four) times a week., Disp: , Rfl:    SYNTHROID 112 MCG tablet, Take 112 mcg by mouth. Takes 5 days a week., Disp: , Rfl:   Past Medical History: Past Medical History:  Diagnosis Date   Abnormal glandular Papanicolaou smear of cervix    Anxiety    Asthma    Body mass index 30.0-30.9, adult    Celiac disease  Chronic vascular disorder of intestine (HCC)    Fibromyalgia    Graves disease    Headache    Migrains   Hyperlipidemia    Hypertension    Hypothyroidism    IBS (irritable bowel syndrome) With constipation    Lupus (systemic lupus erythematosus) (HCC)    Obesity, unspecified    Osteoporosis    Raynaud disease    Sjogren's disease ([place])     Tobacco Use: Social History   Tobacco Use  Smoking Status Never  Smokeless Tobacco Never    Labs: Recent Review Flowsheet Data   There is no flowsheet data to display.     Capillary Blood Glucose: No results found for: GLUCAP   Pulmonary Assessment Scores:  Pulmonary Assessment Scores     Row Name 03/20/21 0848         ADL UCSD   SOB  Score total 38     Rest 0     Walk 3     Stairs 4     Bath 1     Dress 1     Shop 3       CAT Score   CAT Score 18       mMRC Score   mMRC Score 3             UCSD: Self-administered rating of dyspnea associated with activities of daily living (ADLs) 6-point scale (0 = "not at all" to 5 = "maximal or unable to do because of breathlessness")  Scoring Scores range from 0 to 120.  Minimally important difference is 5 units  CAT: CAT can identify the health impairment of COPD patients and is better correlated with disease progression.  CAT has a scoring range of zero to 40. The CAT score is classified into four groups of low (less than 10), medium (10 - 20), high (21-30) and very high (31-40) based on the impact level of disease on health status. A CAT score over 10 suggests significant symptoms.  A worsening CAT score could be explained by an exacerbation, poor medication adherence, poor inhaler technique, or progression of COPD or comorbid conditions.  CAT MCID is 2 points  mMRC: mMRC (Modified Medical Research Council) Dyspnea Scale is used to assess the degree of baseline functional disability in patients of respiratory disease due to dyspnea. No minimal important difference is established. A decrease in score of 1 point or greater is considered a positive change.   Pulmonary Function Assessment:   Exercise Target Goals: Exercise Program Goal: Individual exercise prescription set using results from initial 6 min walk test and THRR while considering  patients activity barriers and safety.   Exercise Prescription Goal: Initial exercise prescription builds to 30-45 minutes a day of aerobic activity, 2-3 days per week.  Home exercise guidelines will be given to patient during program as part of exercise prescription that the participant will acknowledge.  Activity Barriers & Risk Stratification:  Activity Barriers & Cardiac Risk Stratification - 03/20/21 0845       Activity  Barriers & Cardiac Risk Stratification   Activity Barriers Back Problems;Arthritis;Fibromyalgia;Joint Problems;Deconditioning;Shortness of Breath    Cardiac Risk Stratification Low             6 Minute Walk:  6 Minute Walk     Row Name 03/20/21 1102         6 Minute Walk   Phase Initial     Distance 1250 feet     Walk Time 6 minutes     #  of Rest Breaks 0     MPH 2.37     METS 2.76     RPE 13     Perceived Dyspnea  15     VO2 Peak 9.68     Symptoms No     Resting HR 68 bpm     Resting BP 116/78     Resting Oxygen Saturation  95 %     Exercise Oxygen Saturation  during 6 min walk 96 %     Max Ex. HR 86 bpm     Max Ex. BP 132/76     2 Minute Post BP 110/78       Interval HR   1 Minute HR 76     2 Minute HR 80     3 Minute HR 81     4 Minute HR 86     5 Minute HR 80     6 Minute HR 77     2 Minute Post HR 69     Interval Heart Rate? Yes       Interval Oxygen   Interval Oxygen? Yes     Baseline Oxygen Saturation % 95 %     1 Minute Oxygen Saturation % 96 %     1 Minute Liters of Oxygen 0 L     2 Minute Oxygen Saturation % 96 %     2 Minute Liters of Oxygen 0 L     3 Minute Oxygen Saturation % 97 %     3 Minute Liters of Oxygen 0 L     4 Minute Oxygen Saturation % 97 %     4 Minute Liters of Oxygen 0 L     5 Minute Oxygen Saturation % 99 %     5 Minute Liters of Oxygen 0 L     6 Minute Oxygen Saturation % 99 %     6 Minute Liters of Oxygen 0 L     2 Minute Post Oxygen Saturation % 99 %     2 Minute Post Liters of Oxygen 0 L              Oxygen Initial Assessment:  Oxygen Initial Assessment - 03/20/21 1101       Initial 6 min Walk   Oxygen Used None      Program Oxygen Prescription   Program Oxygen Prescription None      Intervention   Short Term Goals To learn and understand importance of monitoring SPO2 with pulse oximeter and demonstrate accurate use of the pulse oximeter.;To learn and understand importance of maintaining oxygen  saturations>88%;To learn and demonstrate proper pursed lip breathing techniques or other breathing techniques. ;To learn and demonstrate proper use of respiratory medications    Long  Term Goals Verbalizes importance of monitoring SPO2 with pulse oximeter and return demonstration;Maintenance of O2 saturations>88%;Exhibits proper breathing techniques, such as pursed lip breathing or other method taught during program session;Compliance with respiratory medication             Oxygen Re-Evaluation:  Oxygen Re-Evaluation     Row Name 04/15/21 1348             Program Oxygen Prescription   Program Oxygen Prescription None         Home Oxygen   Home Oxygen Device None       Sleep Oxygen Prescription None       Home Exercise Oxygen Prescription None       Home Resting  Oxygen Prescription None       Compliance with Home Oxygen Use Yes         Goals/Expected Outcomes   Short Term Goals To learn and understand importance of monitoring SPO2 with pulse oximeter and demonstrate accurate use of the pulse oximeter.;To learn and understand importance of maintaining oxygen saturations>88%;To learn and demonstrate proper pursed lip breathing techniques or other breathing techniques. ;To learn and demonstrate proper use of respiratory medications       Long  Term Goals Verbalizes importance of monitoring SPO2 with pulse oximeter and return demonstration;Maintenance of O2 saturations>88%;Exhibits proper breathing techniques, such as pursed lip breathing or other method taught during program session;Compliance with respiratory medication       Goals/Expected Outcomes compliance                Oxygen Discharge (Final Oxygen Re-Evaluation):  Oxygen Re-Evaluation - 04/15/21 1348       Program Oxygen Prescription   Program Oxygen Prescription None      Home Oxygen   Home Oxygen Device None    Sleep Oxygen Prescription None    Home Exercise Oxygen Prescription None    Home Resting Oxygen  Prescription None    Compliance with Home Oxygen Use Yes      Goals/Expected Outcomes   Short Term Goals To learn and understand importance of monitoring SPO2 with pulse oximeter and demonstrate accurate use of the pulse oximeter.;To learn and understand importance of maintaining oxygen saturations>88%;To learn and demonstrate proper pursed lip breathing techniques or other breathing techniques. ;To learn and demonstrate proper use of respiratory medications    Long  Term Goals Verbalizes importance of monitoring SPO2 with pulse oximeter and return demonstration;Maintenance of O2 saturations>88%;Exhibits proper breathing techniques, such as pursed lip breathing or other method taught during program session;Compliance with respiratory medication    Goals/Expected Outcomes compliance             Initial Exercise Prescription:  Initial Exercise Prescription - 03/20/21 1100       Date of Initial Exercise RX and Referring Provider   Date 03/20/21    Referring Provider Dr. Halford Chessman    Expected Discharge Date 07/24/21      Treadmill   MPH 1    Grade 0    Minutes 17      NuStep   Level 1    SPM 80    Minutes 22      Prescription Details   Frequency (times per week) 2    Duration Progress to 30 minutes of continuous aerobic without signs/symptoms of physical distress      Intensity   THRR 40-80% of Max Heartrate 62-125    Ratings of Perceived Exertion 11-13    Perceived Dyspnea 0-4      Resistance Training   Training Prescription Yes    Weight 2 lbs    Reps 10-15             Perform Capillary Blood Glucose checks as needed.  Exercise Prescription Changes:   Exercise Prescription Changes     Row Name 04/01/21 1200 04/15/21 1300           Response to Exercise   Blood Pressure (Admit) 116/72 112/72      Blood Pressure (Exercise) 120/60 140/72      Blood Pressure (Exit) 118/65 102/62      Heart Rate (Admit) 70 bpm 69 bpm      Heart Rate (Exercise) 78 bpm 78 bpm  Heart Rate (Exit) 65 bpm 69 bpm      Oxygen Saturation (Admit) 97 % 99 %      Oxygen Saturation (Exercise) 95 % 95 %      Oxygen Saturation (Exit) 97 % 96 %      Rating of Perceived Exertion (Exercise) 13 13      Perceived Dyspnea (Exercise) 13 13      Duration Continue with 30 min of aerobic exercise without signs/symptoms of physical distress. Continue with 30 min of aerobic exercise without signs/symptoms of physical distress.      Intensity THRR unchanged THRR unchanged        Progression   Progression Continue to progress workloads to maintain intensity without signs/symptoms of physical distress. Continue to progress workloads to maintain intensity without signs/symptoms of physical distress.        Resistance Training   Training Prescription Yes Yes      Weight 3 lbs 3 lbs      Reps 10-15 10-15      Time 10 Minutes 10 Minutes        Treadmill   MPH 1.5 1.6      Grade 0 0      Minutes 17 17      METs 2.15 2.23        NuStep   Level 1 1      SPM 83 100      Minutes 22 22      METs 1.8 1.9               Exercise Comments:   Exercise Goals and Review:   Exercise Goals     Row Name 03/20/21 1105 04/15/21 1349           Exercise Goals   Increase Physical Activity Yes Yes      Intervention Provide advice, education, support and counseling about physical activity/exercise needs.;Develop an individualized exercise prescription for aerobic and resistive training based on initial evaluation findings, risk stratification, comorbidities and participant's personal goals. Provide advice, education, support and counseling about physical activity/exercise needs.;Develop an individualized exercise prescription for aerobic and resistive training based on initial evaluation findings, risk stratification, comorbidities and participant's personal goals.      Expected Outcomes Short Term: Attend rehab on a regular basis to increase amount of physical activity.;Long Term:  Exercising regularly at least 3-5 days a week.;Long Term: Add in home exercise to make exercise part of routine and to increase amount of physical activity. Short Term: Attend rehab on a regular basis to increase amount of physical activity.;Long Term: Exercising regularly at least 3-5 days a week.;Long Term: Add in home exercise to make exercise part of routine and to increase amount of physical activity.      Increase Strength and Stamina Yes Yes      Intervention Provide advice, education, support and counseling about physical activity/exercise needs.;Develop an individualized exercise prescription for aerobic and resistive training based on initial evaluation findings, risk stratification, comorbidities and participant's personal goals. Provide advice, education, support and counseling about physical activity/exercise needs.;Develop an individualized exercise prescription for aerobic and resistive training based on initial evaluation findings, risk stratification, comorbidities and participant's personal goals.      Expected Outcomes Short Term: Increase workloads from initial exercise prescription for resistance, speed, and METs.;Short Term: Perform resistance training exercises routinely during rehab and add in resistance training at home;Long Term: Improve cardiorespiratory fitness, muscular endurance and strength as measured by increased METs and functional capacity (  6MWT) Short Term: Increase workloads from initial exercise prescription for resistance, speed, and METs.;Short Term: Perform resistance training exercises routinely during rehab and add in resistance training at home;Long Term: Improve cardiorespiratory fitness, muscular endurance and strength as measured by increased METs and functional capacity (6MWT)      Able to understand and use rate of perceived exertion (RPE) scale Yes Yes      Intervention Provide education and explanation on how to use RPE scale Provide education and explanation  on how to use RPE scale      Expected Outcomes Short Term: Able to use RPE daily in rehab to express subjective intensity level;Long Term:  Able to use RPE to guide intensity level when exercising independently Short Term: Able to use RPE daily in rehab to express subjective intensity level;Long Term:  Able to use RPE to guide intensity level when exercising independently      Able to understand and use Dyspnea scale Yes Yes      Intervention Provide education and explanation on how to use Dyspnea scale Provide education and explanation on how to use Dyspnea scale      Expected Outcomes Short Term: Able to use Dyspnea scale daily in rehab to express subjective sense of shortness of breath during exertion;Long Term: Able to use Dyspnea scale to guide intensity level when exercising independently Short Term: Able to use Dyspnea scale daily in rehab to express subjective sense of shortness of breath during exertion;Long Term: Able to use Dyspnea scale to guide intensity level when exercising independently      Knowledge and understanding of Target Heart Rate Range (THRR) Yes Yes      Intervention Provide education and explanation of THRR including how the numbers were predicted and where they are located for reference Provide education and explanation of THRR including how the numbers were predicted and where they are located for reference      Expected Outcomes Short Term: Able to state/look up THRR;Long Term: Able to use THRR to govern intensity when exercising independently;Short Term: Able to use daily as guideline for intensity in rehab Short Term: Able to state/look up THRR;Long Term: Able to use THRR to govern intensity when exercising independently;Short Term: Able to use daily as guideline for intensity in rehab      Understanding of Exercise Prescription Yes Yes      Intervention Provide education, explanation, and written materials on patient's individual exercise prescription Provide education,  explanation, and written materials on patient's individual exercise prescription      Expected Outcomes Short Term: Able to explain program exercise prescription;Long Term: Able to explain home exercise prescription to exercise independently Short Term: Able to explain program exercise prescription;Long Term: Able to explain home exercise prescription to exercise independently               Exercise Goals Re-Evaluation :  Exercise Goals Re-Evaluation     Row Name 04/15/21 1350             Exercise Goal Re-Evaluation   Exercise Goals Review Increase Physical Activity;Able to understand and use Dyspnea scale;Understanding of Exercise Prescription;Increase Strength and Stamina;Knowledge and understanding of Target Heart Rate Range (THRR);Able to understand and use rate of perceived exertion (RPE) scale       Comments Pt has attended 7 exercise sessions of pulmonary rehab. She has been progressing well and slowly progressing her workloads. She does report frustration with not being able to lose weight. She reportst that her strength and stamina  are beginning to improve. She is currently exercising at 2.23 METs. Will continue to monitor and progress as able.       Expected Outcomes Through exercise at rehab and at home, the patient will meet their stated goals.                Discharge Exercise Prescription (Final Exercise Prescription Changes):  Exercise Prescription Changes - 04/15/21 1300       Response to Exercise   Blood Pressure (Admit) 112/72    Blood Pressure (Exercise) 140/72    Blood Pressure (Exit) 102/62    Heart Rate (Admit) 69 bpm    Heart Rate (Exercise) 78 bpm    Heart Rate (Exit) 69 bpm    Oxygen Saturation (Admit) 99 %    Oxygen Saturation (Exercise) 95 %    Oxygen Saturation (Exit) 96 %    Rating of Perceived Exertion (Exercise) 13    Perceived Dyspnea (Exercise) 13    Duration Continue with 30 min of aerobic exercise without signs/symptoms of physical  distress.    Intensity THRR unchanged      Progression   Progression Continue to progress workloads to maintain intensity without signs/symptoms of physical distress.      Resistance Training   Training Prescription Yes    Weight 3 lbs    Reps 10-15    Time 10 Minutes      Treadmill   MPH 1.6    Grade 0    Minutes 17    METs 2.23      NuStep   Level 1    SPM 100    Minutes 22    METs 1.9             Nutrition:  Target Goals: Understanding of nutrition guidelines, daily intake of sodium 1500mg , cholesterol 200mg , calories 30% from fat and 7% or less from saturated fats, daily to have 5 or more servings of fruits and vegetables.  Biometrics:  Pre Biometrics - 03/20/21 1110       Pre Biometrics   Height 5\' 3"  (1.6 m)    Weight 78.9 kg    Waist Circumference 41 inches    Hip Circumference 45.5 inches    Waist to Hip Ratio 0.9 %    BMI (Calculated) 30.82    Triceps Skinfold 35 mm    % Body Fat 43.9 %    Grip Strength 21.2 kg    Flexibility 0 in    Single Leg Stand 22.64 seconds              Nutrition Therapy Plan and Nutrition Goals:  Nutrition Therapy & Goals - 04/09/21 0830       Personal Nutrition Goals   Comments Nutrition handout given to patient on how to choose heart healthy meals.  We provide 2 educational sessions on heart healthy nutruition and assistance with registered dietitian  referal if patient is interested.      Intervention Plan   Intervention Nutrition handout(s) given to patient.    Expected Outcomes Short Term Goal: Understand basic principles of dietary content, such as calories, fat, sodium, cholesterol and nutrients.             Nutrition Assessments:  Nutrition Assessments - 03/20/21 0858       MEDFICTS Scores   Pre Score 0            MEDIFICTS Score Key: ?70 Need to make dietary changes  40-70 Heart Healthy Diet ? 40 Therapeutic Level  Cholesterol Diet   Picture Your Plate Scores: [id number] Unhealthy dietary  pattern with much room for improvement. 41-50 Dietary pattern unlikely to meet recommendations for good health and room for improvement. 51-60 More healthful dietary pattern, with some room for improvement.  >60 Healthy dietary pattern, although there may be some specific behaviors that could be improved.    Nutrition Goals Re-Evaluation:   Nutrition Goals Discharge (Final Nutrition Goals Re-Evaluation):   Psychosocial: Target Goals: Acknowledge presence or absence of significant depression and/or stress, maximize coping skills, provide positive support system. Participant is able to verbalize types and ability to use techniques and skills needed for reducing stress and depression.  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 This is very stressful for her. She is also stressed about the long term effects of having COVID which has increased her SOB and activity tolerance.      Family Dynamics   Good Support System? Yes    Comments Her older sister is her main support system.      Barriers   Psychosocial barriers to participate in program The patient should benefit from training in stress management and relaxation.;Psychosocial barriers identified (see note)      Screening Interventions   Interventions Encouraged to exercise;To provide support and resources with identified psychosocial needs;Provide feedback about the scores to participant    Expected Outcomes Long Term goal: The participant improves quality of Life and PHQ9 Scores as seen by post scores and/or verbalization of changes;Short Term goal: Identification and review with participant of any Quality of Life or Depression concerns found by scoring the  questionnaire.;Long Term Goal: Stressors or current issues are controlled or eliminated.             Quality of Life Scores:  Quality of Life - 03/20/21 1111       Quality of Life   Select Quality of Life      Quality of Life Scores   Health/Function Pre 11.97 %    Socioeconomic Pre 28.17 %    Psych/Spiritual Pre 19.5 %    Family Pre 21 %    GLOBAL Pre 17.78 %            Scores of 19 and below usually indicate a poorer quality of life in these areas.  A difference of  2-3 points is a clinically meaningful difference.  A difference of 2-3 points in the total score of the Quality of Life Index has been associated with significant improvement in overall quality of life, self-image, physical symptoms, and general health in studies assessing change in quality of life.   PHQ-9: Recent Review Flowsheet Data     Depression screen [facility] 2/9 03/20/2021 02/06/2020 12/14/2018   Decreased Interest 1 2 0   Down, Depressed, Hopeless 1 2 0   PHQ - 2 Score 2 4 0   Altered sleeping 0 2 -   Tired, decreased energy 2 2 -   Change in appetite 1 2 -   Feeling bad or failure about yourself  0 1 -   Trouble concentrating 1 1 -   Moving slowly or fidgety/restless 0 0 -   Suicidal thoughts 0 0 -   PHQ-9  Score 6 12 -   Difficult doing work/chores Somewhat difficult - -      Interpretation of Total Score  Total Score Depression Severity:  1-4 = Minimal depression, 5-9 = Mild depression, 10-14 = Moderate depression, 15-19 = Moderately severe depression, 20-27 = Severe depression   Psychosocial Evaluation and Intervention:  Psychosocial Evaluation - 03/20/21 1113       Psychosocial Evaluation & Interventions   Interventions Stress management education;Relaxation education;Encouraged to exercise with the program and follow exercise prescription    Comments Pt has no barriers to completing pulmonary rehab. She does have anxiety which is treated with clonazepam 0.5 mg PRN. She reports that this  does help "take the edge off". She did score a 6 on her PHQ-9. A lot of this she attributes to her health. She has had COVID-19 3 times in the past 4 months and this has caused her to be SOB with minimal exertion and fatigue easily. She also has several autoimmune diseases that attribute to this and celiac disease. She also reports her health as a source of stress. 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 He has suffered 2 strokes in the past couple of years and this has caused him to experience frequent confusion, agitation, and has impacted some of his motor skills and vision. She reports this to be very stressful and at times overwhelming. She does have neighbors that she socializes with, but does not bring her problems to them as she feels that they would overwhelm them. Her main support system is her sister, but she lives in [place], so they can only communicate on the phone. She has made an appointment with [facility] in [place] to talk with a therapist. She is hopeful that this will be helpful for her. Her goals while in the program are to lose weight, improve her SOB, and to improve her overall well-being. She is excited about starting the program and is thankful to be able to do it.    Expected Outcomes Pt's anxiety will continue to be treated with clonazepam and she will see a therapist to reduce some of her stress. She will have no other identifiable psychosocial issues.    Continue Psychosocial Services  Follow up required by staff             Psychosocial Re-Evaluation:  Psychosocial Re-Evaluation     Row Name 04/09/21 [phone]             Psychosocial Re-Evaluation   Current issues with Current Stress Concerns;Current Sleep Concerns       Comments Patient is new to the program attenting 5 sessions.  Her initial QOL score is 18 and her PHQ-9 score is 6.  She seems to enjoy coming to the program and interacts well with class and staff members.  She  has a positve outlook , attitude and will continue to encouage her to work hard in [place].  We will continue to monitor as she progresses.       Expected Outcomes Patient will have no psychosocial barries identified at discharge.       Interventions Encouraged to attend Pulmonary Rehabilitation for the exercise;Relaxation education;Stress management education       Continue Psychosocial Services  --  She reports that she is schedule to meet with a therapist.         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 This is very stressful for her. She is also stressed about the long term effects of having COVID which has increased her SOB and activity tolerance.                Psychosocial Discharge (Final Psychosocial Re-Evaluation):  Psychosocial Re-Evaluation - 04/09/21 0817       Psychosocial Re-Evaluation   Current issues with Current Stress Concerns;Current Sleep Concerns    Comments Patient is new to the program attenting 5 sessions.  Her initial QOL score is 18 and her PHQ-9 score is 6.  She seems to enjoy coming to the program and interacts well with class and staff members.  She has a positve outlook , attitude and will continue to encouage her to work hard in [place].  We will continue to monitor as she progresses.    Expected Outcomes Patient will have no psychosocial barries identified at discharge.    Interventions Encouraged to attend Pulmonary Rehabilitation for the exercise;Relaxation education;Stress management education    Continue Psychosocial Services  --   She reports that she is schedule to meet with a therapist.     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 This is very stressful for her. She is also stressed about the long term effects of having COVID which has  increased her SOB and activity tolerance.              Education: Education Goals: Education classes will be provided on a weekly basis, covering required topics. Participant will state understanding/return demonstration of topics presented.  Learning Barriers/Preferences:  Learning Barriers/Preferences - 03/20/21 0905       Learning Barriers/Preferences   Learning Barriers None    Learning Preferences Skilled Demonstration             Education Topics: How Lungs Work and Diseases: - Discuss the anatomy of the lungs and diseases that can affect the lungs, such as COPD.   Exercise: -Discuss the importance of exercise, FITT principles of exercise, normal and abnormal responses to exercise, and how to exercise safely.   Environmental Irritants: -Discuss types of environmental irritants and how to limit exposure to environmental irritants. Flowsheet Row PULMONARY REHAB OTHER RESPIRATORY from 04/10/2021 in [place]  Date 03/27/21  Educator DF  Instruction Review Code 2- Demonstrated Understanding       Meds/Inhalers and oxygen: - Discuss respiratory medications, definition of an inhaler and oxygen, and the proper way to use an inhaler and oxygen. Flowsheet Row PULMONARY REHAB OTHER RESPIRATORY from 04/10/2021 in [place]  Date 04/03/21  Educator pb       Energy Saving Techniques: - Discuss methods to conserve energy and decrease shortness of breath when performing activities of daily living.  Flowsheet Row PULMONARY REHAB OTHER RESPIRATORY from 04/10/2021 in [place]  Date 04/10/21  Educator DF  Instruction Review Code 1- Verbalizes Understanding       Bronchial Hygiene / Breathing Techniques: - Discuss breathing mechanics, pursed-lip breathing technique,  proper posture, effective ways to clear airways, and other functional breathing techniques   Cleaning Equipment: - Provides  group verbal and written instruction about the health risks of elevated stress, cause of high stress, and healthy ways to reduce stress.   Nutrition I: Fats: - Discuss  the types of cholesterol, what cholesterol does to the body, and how cholesterol levels can be controlled.   Nutrition II: Labels: -Discuss the different components of food labels and how to read food labels.   Respiratory Infections: - Discuss the signs and symptoms of respiratory infections, ways to prevent respiratory infections, and the importance of seeking medical treatment when having a respiratory infection.   Stress I: Signs and Symptoms: - Discuss the causes of stress, how stress may lead to anxiety and depression, and ways to limit stress.   Stress II: Relaxation: -Discuss relaxation techniques to limit stress.   Oxygen for Home/Travel: - Discuss how to prepare for travel when on oxygen and proper ways to transport and store oxygen to ensure safety.   Knowledge Questionnaire Score:  Knowledge Questionnaire Score - 03/20/21 0905       Knowledge Questionnaire Score   Pre Score 15/18             Core Components/Risk Factors/Patient Goals at Admission:  Personal Goals and Risk Factors at Admission - 03/20/21 0908       Core Components/Risk Factors/Patient Goals on Admission    Weight Management Yes;Weight Loss;Obesity    Intervention Weight Management: Develop a combined nutrition and exercise program designed to reach desired caloric intake, while maintaining appropriate intake of nutrient and fiber, sodium and fats, and appropriate energy expenditure required for the weight goal.;Weight Management: Provide education and appropriate resources to help participant work on and attain dietary goals.;Weight Management/Obesity: Establish reasonable short term and long term weight goals.;Obesity: Provide education and appropriate resources to help participant work on and attain dietary goals.    Expected  Outcomes Short Term: Continue to assess and modify interventions until short term weight is achieved;Long Term: Adherence to nutrition and physical activity/exercise program aimed toward attainment of established weight goal;Weight Loss: Understanding of general recommendations for a balanced deficit meal plan, which promotes 1-2 lb weight loss per week and includes a negative energy balance of [phone] kcal/d;Understanding recommendations for meals to include 15-35% energy as protein, 25-35% energy from fat, 35-60% energy from carbohydrates, less than 200mg  of dietary cholesterol, 20-35 gm of total fiber daily;Understanding of distribution of calorie intake throughout the day with the consumption of 4-5 meals/snacks;Weight Maintenance: Understanding of the daily nutrition guidelines, which includes 25-35% calories from fat, 7% or less cal from saturated fats, less than 200mg  cholesterol, less than 1.5gm of sodium, & 5 or more servings of fruits and vegetables daily    Improve shortness of breath with ADL's Yes    Intervention Provide education, individualized exercise plan and daily activity instruction to help decrease symptoms of SOB with activities of daily living.    Expected Outcomes Short Term: Improve cardiorespiratory fitness to achieve a reduction of symptoms when performing ADLs;Long Term: Be able to perform more ADLs without symptoms or delay the onset of symptoms    Personal Goal Other Yes    Personal Goal Improve well being overall    Intervention Attend pulmonary rehab, begin home exercise, educational materials reviewed, social support given at rehab.    Expected Outcomes Her overall well being will be improved.             Core Components/Risk Factors/Patient Goals Review:   Goals and Risk Factor Review     Row Name 04/09/21 0840             Core Components/Risk Factors/Patient Goals Review   Personal Goals Review Weight Management/Obesity;Improve shortness of breath with  ADL's;Stress       Review Patient is new to the program completing 5 sessions.  Plans to have stress test January 30.  Her CT scan was noted to have calcification. Plan to evaluate her  for coronary atherosclerosis.  She is doing well in the program with progression and consistent attendance.  Her personal goals are to lose wwight, she is maintaining her weight at 78.5 kg.  She is improving her SOB and is exercising without oxygen at this time, O2 sats average while exercising are 95%- 97% . We will continue to monitor as she works toward meeting these goals.       Expected Outcomes Patient will complete the program meeting both program and personal goals.                Core Components/Risk Factors/Patient Goals at Discharge (Final Review):   Goals and Risk Factor Review - 04/09/21 0840       Core Components/Risk Factors/Patient Goals Review   Personal Goals Review Weight Management/Obesity;Improve shortness of breath with ADL's;Stress    Review Patient is new to the program completing 5 sessions.  Plans to have stress test January 30.  Her CT scan was noted to have calcification. Plan to evaluate her  for coronary atherosclerosis.  She is doing well in the program with progression and consistent attendance.  Her personal goals are to lose wwight, she is maintaining her weight at 78.5 kg.  She is improving her SOB and is exercising without oxygen at this time, O2 sats average while exercising are 95%- 97% . We will continue to monitor as she works toward meeting these goals.    Expected Outcomes Patient will complete the program meeting both program and personal goals.             ITP Comments:   Comments: ITP REVIEW Pt is making expected progress toward pulmonary rehab goals after completing 8 sessions. Recommend continued exercise, life style modification, education, and utilization of breathing techniques to increase stamina and strength and decrease shortness of breath with  exertion.

## 2021-04-17 ENCOUNTER — Encounter (HOSPITAL_COMMUNITY): Payer: Medicare Other

## 2021-04-18 ENCOUNTER — Telehealth: Payer: Self-pay | Admitting: Cardiology

## 2021-04-18 NOTE — Telephone Encounter (Signed)
° ° °  Pt is calling to get stress test result °

## 2021-04-18 NOTE — Telephone Encounter (Signed)
I will forward to Dr.Branch for results

## 2021-04-21 ENCOUNTER — Telehealth: Payer: Self-pay

## 2021-04-21 ENCOUNTER — Other Ambulatory Visit: Payer: Medicare Other | Admitting: Women's Health

## 2021-04-21 DIAGNOSIS — E039 Hypothyroidism, unspecified: Secondary | ICD-10-CM | POA: Diagnosis not present

## 2021-04-21 NOTE — Telephone Encounter (Signed)
-----   Message from Arnoldo Lenis, MD sent at 04/20/2021  8:06 AM EST ----- Stress test overall looks good. No evidence of any significant blockages. Looks to be likely some normal shadowing of the heart on the test, cannot completely exclude a very small area of blockage that is so small would not be of significant risk. F/u 6 months  Zandra Abts MD

## 2021-04-21 NOTE — Telephone Encounter (Signed)
Pt notified of results. Please see phone note

## 2021-04-21 NOTE — Telephone Encounter (Signed)
Pt notified and verbalized understanding. Pt had no questions or concerns at this time. PCP copied. 76m f/u appt made.

## 2021-04-22 ENCOUNTER — Encounter (HOSPITAL_COMMUNITY): Payer: Medicare Other

## 2021-04-23 ENCOUNTER — Encounter: Payer: Self-pay | Admitting: Cardiology

## 2021-04-23 ENCOUNTER — Encounter (HOSPITAL_COMMUNITY): Payer: Medicare Other | Admitting: Occupational Therapy

## 2021-04-23 DIAGNOSIS — H04123 Dry eye syndrome of bilateral lacrimal glands: Secondary | ICD-10-CM | POA: Diagnosis not present

## 2021-04-24 ENCOUNTER — Encounter (HOSPITAL_COMMUNITY): Payer: Medicare Other

## 2021-04-29 ENCOUNTER — Encounter (HOSPITAL_COMMUNITY): Payer: Medicare Other

## 2021-04-30 ENCOUNTER — Encounter (HOSPITAL_COMMUNITY): Payer: Medicare Other | Admitting: Occupational Therapy

## 2021-04-30 ENCOUNTER — Other Ambulatory Visit: Payer: Medicare Other | Admitting: Women's Health

## 2021-05-01 ENCOUNTER — Encounter (HOSPITAL_COMMUNITY): Payer: Medicare Other

## 2021-05-06 ENCOUNTER — Encounter (HOSPITAL_COMMUNITY)
Admission: RE | Admit: 2021-05-06 | Discharge: 2021-05-06 | Disposition: A | Discharge status: Home / Self Care | Payer: Medicare Other | Source: Ambulatory Visit | Attending: Pulmonary Disease | Admitting: Pulmonary Disease

## 2021-05-06 ENCOUNTER — Ambulatory Visit: Payer: Medicare Other | Admitting: Gastroenterology

## 2021-05-06 DIAGNOSIS — U099 Post covid-19 condition, unspecified: Secondary | ICD-10-CM | POA: Diagnosis not present

## 2021-05-06 DIAGNOSIS — R0609 Other forms of dyspnea: Secondary | ICD-10-CM

## 2021-05-06 NOTE — Progress Notes (Signed)
Daily Session Note  Patient Details  Name: Katie Davis MRN: [id number] Date of Birth: [birth year] Referring Provider:   Flowsheet Row PULMONARY REHAB OTHER RESP ORIENTATION from 03/20/2021 in [place]  Referring Provider Dr. Halford Chessman       Encounter Date: 05/06/2021  Check In:  Session Check In - 05/06/21 1045       Check-In   Supervising physician immediately available to respond to emergencies [facility] MD immediately available    Physician(s) Dr. Debara Pickett    Location [facility]    Staff Present Redge Gainer, BS, Exercise Physiologist;Dalton Kris Mouton, MS, ACSM-CEP, Exercise Physiologist;Eleasha Cataldo, RN    Virtual Visit No    Medication changes reported     No    Fall or balance concerns reported    No    Tobacco Cessation No Change    Warm-up and Cool-down Performed as group-led instruction    Resistance Training Performed Yes    VAD Patient? No    PAD/SET Patient? No      Pain Assessment   Currently in Pain? No/denies    Pain Score 0-No pain    Multiple Pain Sites No             Capillary Blood Glucose: No results found for this or any previous visit (from the past 24 hour(s)).    Social History   Tobacco Use  Smoking Status Never  Smokeless Tobacco Never    Goals Met:  Proper associated with RPD/PD & O2 Sat Independence with exercise equipment Exercise tolerated well No report of concerns or symptoms today Strength training completed today  Goals Unmet:  Not Applicable  Comments: check out @ 11:45am   Dr. Kathie Dike is Medical Director for [facility].

## 2021-05-07 ENCOUNTER — Encounter (HOSPITAL_COMMUNITY): Payer: Medicare Other | Admitting: Occupational Therapy

## 2021-05-08 ENCOUNTER — Encounter (HOSPITAL_COMMUNITY)
Admission: RE | Admit: 2021-05-08 | Discharge: 2021-05-08 | Disposition: A | Discharge status: Home / Self Care | Payer: Medicare Other | Source: Ambulatory Visit | Attending: Pulmonary Disease | Admitting: Pulmonary Disease

## 2021-05-08 DIAGNOSIS — R0609 Other forms of dyspnea: Secondary | ICD-10-CM | POA: Diagnosis not present

## 2021-05-08 DIAGNOSIS — U099 Post covid-19 condition, unspecified: Secondary | ICD-10-CM | POA: Diagnosis not present

## 2021-05-08 NOTE — Progress Notes (Signed)
Daily Session Note  Patient Details  Name: Janaria Mccammon MRN: [id number] Date of Birth: 20-Mar-1955 Referring Provider:   Flowsheet Row PULMONARY REHAB OTHER RESP ORIENTATION from 03/20/2021 in [place]  Referring Provider Dr. Halford Chessman       Encounter Date: 05/08/2021  Check In:  Session Check In - 05/08/21 1045       Check-In   Supervising physician immediately available to respond to emergencies [facility] MD immediately available    Physician(s) Dr. Domenic Polite    Location [facility]    Staff Present Redge Gainer, BS, Exercise Physiologist;Darrius Montano Kris Mouton, MS, ACSM-CEP, Exercise Physiologist;Other    Virtual Visit No    Medication changes reported     No    Fall or balance concerns reported    No    Tobacco Cessation No Change    Warm-up and Cool-down Performed as group-led instruction    Resistance Training Performed Yes    VAD Patient? No    PAD/SET Patient? No      Pain Assessment   Currently in Pain? No/denies    Pain Score 0-No pain    Multiple Pain Sites No             Capillary Blood Glucose: No results found for this or any previous visit (from the past 24 hour(s)).    Social History   Tobacco Use  Smoking Status Never  Smokeless Tobacco Never    Goals Met:  Independence with exercise equipment Exercise tolerated well No report of concerns or symptoms today Strength training completed today  Goals Unmet:  Not Applicable  Comments: checkout time is 1145   Dr. Kathie Dike is Medical Director for [facility].

## 2021-05-13 ENCOUNTER — Other Ambulatory Visit (HOSPITAL_COMMUNITY): Payer: Self-pay | Admitting: Family Medicine

## 2021-05-13 ENCOUNTER — Other Ambulatory Visit: Payer: Self-pay | Admitting: Family Medicine

## 2021-05-13 ENCOUNTER — Encounter (HOSPITAL_COMMUNITY): Payer: Medicare Other

## 2021-05-13 DIAGNOSIS — R109 Unspecified abdominal pain: Secondary | ICD-10-CM

## 2021-05-14 ENCOUNTER — Ambulatory Visit (INDEPENDENT_AMBULATORY_CARE_PROVIDER_SITE_OTHER): Payer: Medicare Other | Admitting: Women's Health

## 2021-05-14 ENCOUNTER — Other Ambulatory Visit: Payer: Self-pay

## 2021-05-14 ENCOUNTER — Other Ambulatory Visit (HOSPITAL_COMMUNITY)
Admission: RE | Admit: 2021-05-14 | Discharge: 2021-05-14 | Disposition: A | Discharge status: Home / Self Care | Payer: Medicare Other | Source: Ambulatory Visit | Attending: Women's Health | Admitting: Women's Health

## 2021-05-14 ENCOUNTER — Encounter: Payer: Self-pay | Admitting: Women's Health

## 2021-05-14 VITALS — BP 119/66 | HR 69 | Ht 63.0 in | Wt 172.0 lb

## 2021-05-14 DIAGNOSIS — Z01419 Encounter for gynecological examination (general) (routine) without abnormal findings: Secondary | ICD-10-CM | POA: Insufficient documentation

## 2021-05-14 DIAGNOSIS — Z86018 Personal history of other benign neoplasm: Secondary | ICD-10-CM | POA: Diagnosis not present

## 2021-05-14 DIAGNOSIS — Z1151 Encounter for screening for human papillomavirus (HPV): Secondary | ICD-10-CM | POA: Insufficient documentation

## 2021-05-14 DIAGNOSIS — R102 Pelvic and perineal pain: Secondary | ICD-10-CM | POA: Diagnosis not present

## 2021-05-14 DIAGNOSIS — Z8742 Personal history of other diseases of the female genital tract: Secondary | ICD-10-CM | POA: Diagnosis not present

## 2021-05-14 NOTE — Progress Notes (Signed)
Pulmonary Individual Treatment Plan  Patient Details  Name: Katie Davis MRN: [id number] Date of Birth: May 19, 1955 Referring Provider:   Flowsheet Row PULMONARY REHAB OTHER RESP ORIENTATION from 03/20/2021 in [facility]  Referring Provider Dr. Halford Chessman       Initial Encounter Date:  Flowsheet Row PULMONARY REHAB OTHER RESP ORIENTATION from 03/20/2021 in [facility]  Date 03/20/21       Visit Diagnosis: DOE (dyspnea on exertion)  Post covid-19 condition, unspecified  Patient's Home Medications on Admission:   Current Outpatient Medications:    acidophilus (RISAQUAD) CAPS capsule, Take 1 capsule by mouth daily., Disp: , Rfl:    albuterol (VENTOLIN HFA) 108 (90 Base) MCG/ACT inhaler, Inhale 1-2 puffs into the lungs every 6 (six) hours as needed for wheezing or shortness of breath., Disp: , Rfl:    aspirin EC 81 MG tablet, Take 1 tablet (81 mg total) by mouth daily. Swallow whole., Disp: 90 tablet, Rfl: 3   atenolol (TENORMIN) 50 MG tablet, Take 50 mg by mouth 2 (two) times daily. , Disp: , Rfl:    calcium carbonate (TUMS - DOSED IN MG ELEMENTAL CALCIUM) 500 MG chewable tablet, Chew 1 tablet by mouth daily., Disp: , Rfl:    Cholecalciferol (VITAMIN D3 GUMMIES) 25 MCG (1000 UT) CHEW, Chew 6,000 Units by mouth daily., Disp: , Rfl:    clonazePAM (KLONOPIN) 0.5 MG tablet, Take 0.25-0.5 mg by mouth 2 (two) times daily as needed for anxiety. , Disp: , Rfl:    cyclobenzaprine (FLEXERIL) 10 MG tablet, Take 10 mg by mouth 3 (three) times daily as needed for muscle spasms. , Disp: , Rfl:    ELDERBERRY PO, Take 1 capsule by mouth daily., Disp: , Rfl:    fluticasone furoate-vilanterol (BREO ELLIPTA) 100-25 MCG/ACT AEPB, Inhale 1 puff into the lungs daily., Disp: , Rfl:    ibuprofen (ADVIL,MOTRIN) 800 MG tablet, Take 800 mg by mouth every 8 (eight) hours as needed for moderate pain., Disp: , Rfl:    linaclotide (LINZESS) 145 MCG CAPS capsule, Take 1 capsule  (145 mcg total) by mouth daily before breakfast., Disp: 30 capsule, Rfl: 3   meclizine (ANTIVERT) 12.5 MG tablet, Take 12.5-25 mg by mouth every 6 (six) hours as needed for dizziness. , Disp: , Rfl:    Omega-3 Fatty Acids (FISH OIL) 1000 MG CAPS, Take 2,000 mg by mouth daily., Disp: , Rfl:    ondansetron (ZOFRAN) 4 MG tablet, Take 1 tablet (4 mg total) by mouth every 8 (eight) hours as needed for nausea or vomiting., Disp: 12 tablet, Rfl: 0   oxyCODONE-acetaminophen (PERCOCET/ROXICET) 5-325 MG tablet, Take 1 tablet by mouth 2 (two) times daily as needed for moderate pain or severe pain. , Disp: , Rfl:    pantoprazole (PROTONIX) 40 MG tablet, Take 1 tablet (40 mg total) by mouth daily before breakfast., Disp: 30 tablet, Rfl: 3   RESTASIS 0.05 % ophthalmic emulsion, Place 1 drop into both eyes 2 (two) times daily., Disp: , Rfl:    rosuvastatin (CRESTOR) 5 MG tablet, Take 5 mg by mouth 4 (four) times a week., Disp: , Rfl:    SYNTHROID 112 MCG tablet, Take 112 mcg by mouth. Takes 5 days a week., Disp: , Rfl:   Past Medical History: Past Medical History:  Diagnosis Date   Abnormal glandular Papanicolaou smear of cervix    Anxiety    Asthma    Body mass index 30.0-30.9, adult    Celiac disease  Chronic vascular disorder of intestine (HCC)    Fibromyalgia    Graves disease    Headache    Migrains   Hyperlipidemia    Hypertension    Hypothyroidism    IBS (irritable bowel syndrome) With constipation    Lupus (systemic lupus erythematosus) (HCC)    Obesity, unspecified    Osteoporosis    Raynaud disease    Sjogren's disease ([place])     Tobacco Use: Social History   Tobacco Use  Smoking Status Never  Smokeless Tobacco Never    Labs: Recent Review Flowsheet Data   There is no flowsheet data to display.     Capillary Blood Glucose: No results found for: GLUCAP   Pulmonary Assessment Scores:  Pulmonary Assessment Scores     Row Name 03/20/21 0848         ADL UCSD   SOB  Score total 38     Rest 0     Walk 3     Stairs 4     Bath 1     Dress 1     Shop 3       CAT Score   CAT Score 18       mMRC Score   mMRC Score 3             UCSD: Self-administered rating of dyspnea associated with activities of daily living (ADLs) 6-point scale (0 = "not at all" to 5 = "maximal or unable to do because of breathlessness")  Scoring Scores range from 0 to 120.  Minimally important difference is 5 units  CAT: CAT can identify the health impairment of COPD patients and is better correlated with disease progression.  CAT has a scoring range of zero to 40. The CAT score is classified into four groups of low (less than 10), medium (10 - 20), high (21-30) and very high (31-40) based on the impact level of disease on health status. A CAT score over 10 suggests significant symptoms.  A worsening CAT score could be explained by an exacerbation, poor medication adherence, poor inhaler technique, or progression of COPD or comorbid conditions.  CAT MCID is 2 points  mMRC: mMRC (Modified Medical Research Council) Dyspnea Scale is used to assess the degree of baseline functional disability in patients of respiratory disease due to dyspnea. No minimal important difference is established. A decrease in score of 1 point or greater is considered a positive change.   Pulmonary Function Assessment:   Exercise Target Goals: Exercise Program Goal: Individual exercise prescription set using results from initial 6 min walk test and THRR while considering  patients activity barriers and safety.   Exercise Prescription Goal: Initial exercise prescription builds to 30-45 minutes a day of aerobic activity, 2-3 days per week.  Home exercise guidelines will be given to patient during program as part of exercise prescription that the participant will acknowledge.  Activity Barriers & Risk Stratification:  Activity Barriers & Cardiac Risk Stratification - 03/20/21 0845       Activity  Barriers & Cardiac Risk Stratification   Activity Barriers Back Problems;Arthritis;Fibromyalgia;Joint Problems;Deconditioning;Shortness of Breath    Cardiac Risk Stratification Low             6 Minute Walk:  6 Minute Walk     Row Name 03/20/21 1102         6 Minute Walk   Phase Initial     Distance 1250 feet     Walk Time 6 minutes     #  of Rest Breaks 0     MPH 2.37     METS 2.76     RPE 13     Perceived Dyspnea  15     VO2 Peak 9.68     Symptoms No     Resting HR 68 bpm     Resting BP 116/78     Resting Oxygen Saturation  95 %     Exercise Oxygen Saturation  during 6 min walk 96 %     Max Ex. HR 86 bpm     Max Ex. BP 132/76     2 Minute Post BP 110/78       Interval HR   1 Minute HR 76     2 Minute HR 80     3 Minute HR 81     4 Minute HR 86     5 Minute HR 80     6 Minute HR 77     2 Minute Post HR 69     Interval Heart Rate? Yes       Interval Oxygen   Interval Oxygen? Yes     Baseline Oxygen Saturation % 95 %     1 Minute Oxygen Saturation % 96 %     1 Minute Liters of Oxygen 0 L     2 Minute Oxygen Saturation % 96 %     2 Minute Liters of Oxygen 0 L     3 Minute Oxygen Saturation % 97 %     3 Minute Liters of Oxygen 0 L     4 Minute Oxygen Saturation % 97 %     4 Minute Liters of Oxygen 0 L     5 Minute Oxygen Saturation % 99 %     5 Minute Liters of Oxygen 0 L     6 Minute Oxygen Saturation % 99 %     6 Minute Liters of Oxygen 0 L     2 Minute Post Oxygen Saturation % 99 %     2 Minute Post Liters of Oxygen 0 L              Oxygen Initial Assessment:  Oxygen Initial Assessment - 03/20/21 1101       Initial 6 min Walk   Oxygen Used None      Program Oxygen Prescription   Program Oxygen Prescription None      Intervention   Short Term Goals To learn and understand importance of monitoring SPO2 with pulse oximeter and demonstrate accurate use of the pulse oximeter.;To learn and understand importance of maintaining oxygen  saturations>88%;To learn and demonstrate proper pursed lip breathing techniques or other breathing techniques. ;To learn and demonstrate proper use of respiratory medications    Long  Term Goals Verbalizes importance of monitoring SPO2 with pulse oximeter and return demonstration;Maintenance of O2 saturations>88%;Exhibits proper breathing techniques, such as pursed lip breathing or other method taught during program session;Compliance with respiratory medication             Oxygen Re-Evaluation:  Oxygen Re-Evaluation     Row Name 04/15/21 1348 05/08/21 1143           Program Oxygen Prescription   Program Oxygen Prescription None None        Home Oxygen   Home Oxygen Device None None      Sleep Oxygen Prescription None None      Home Exercise Oxygen Prescription None None      Home Resting  Oxygen Prescription None None      Compliance with Home Oxygen Use Yes Yes        Goals/Expected Outcomes   Short Term Goals To learn and understand importance of monitoring SPO2 with pulse oximeter and demonstrate accurate use of the pulse oximeter.;To learn and understand importance of maintaining oxygen saturations>88%;To learn and demonstrate proper pursed lip breathing techniques or other breathing techniques. ;To learn and demonstrate proper use of respiratory medications To learn and understand importance of monitoring SPO2 with pulse oximeter and demonstrate accurate use of the pulse oximeter.;To learn and understand importance of maintaining oxygen saturations>88%;To learn and demonstrate proper pursed lip breathing techniques or other breathing techniques. ;To learn and demonstrate proper use of respiratory medications      Long  Term Goals Verbalizes importance of monitoring SPO2 with pulse oximeter and return demonstration;Maintenance of O2 saturations>88%;Exhibits proper breathing techniques, such as pursed lip breathing or other method taught during program session;Compliance with  respiratory medication Verbalizes importance of monitoring SPO2 with pulse oximeter and return demonstration;Maintenance of O2 saturations>88%;Exhibits proper breathing techniques, such as pursed lip breathing or other method taught during program session;Compliance with respiratory medication      Goals/Expected Outcomes compliance compliance               Oxygen Discharge (Final Oxygen Re-Evaluation):  Oxygen Re-Evaluation - 05/08/21 1143       Program Oxygen Prescription   Program Oxygen Prescription None      Home Oxygen   Home Oxygen Device None    Sleep Oxygen Prescription None    Home Exercise Oxygen Prescription None    Home Resting Oxygen Prescription None    Compliance with Home Oxygen Use Yes      Goals/Expected Outcomes   Short Term Goals To learn and understand importance of monitoring SPO2 with pulse oximeter and demonstrate accurate use of the pulse oximeter.;To learn and understand importance of maintaining oxygen saturations>88%;To learn and demonstrate proper pursed lip breathing techniques or other breathing techniques. ;To learn and demonstrate proper use of respiratory medications    Long  Term Goals Verbalizes importance of monitoring SPO2 with pulse oximeter and return demonstration;Maintenance of O2 saturations>88%;Exhibits proper breathing techniques, such as pursed lip breathing or other method taught during program session;Compliance with respiratory medication    Goals/Expected Outcomes compliance             Initial Exercise Prescription:  Initial Exercise Prescription - 03/20/21 1100       Date of Initial Exercise RX and Referring Provider   Date 03/20/21    Referring Provider Dr. Halford Chessman    Expected Discharge Date 07/24/21      Treadmill   MPH 1    Grade 0    Minutes 17      NuStep   Level 1    SPM 80    Minutes 22      Prescription Details   Frequency (times per week) 2    Duration Progress to 30 minutes of continuous aerobic without  signs/symptoms of physical distress      Intensity   THRR 40-80% of Max Heartrate 62-125    Ratings of Perceived Exertion 11-13    Perceived Dyspnea 0-4      Resistance Training   Training Prescription Yes    Weight 2 lbs    Reps 10-15             Perform Capillary Blood Glucose checks as needed.  Exercise Prescription Changes:  Exercise Prescription Changes     Row Name 04/01/21 1200 04/15/21 1300 05/08/21 1144         Response to Exercise   Blood Pressure (Admit) 116/72 112/72 98/68     Blood Pressure (Exercise) 120/60 140/72 112/60     Blood Pressure (Exit) 118/65 102/62 110/60     Heart Rate (Admit) 70 bpm 69 bpm 78 bpm     Heart Rate (Exercise) 78 bpm 78 bpm 85 bpm     Heart Rate (Exit) 65 bpm 69 bpm 80 bpm     Oxygen Saturation (Admit) 97 % 99 % 97 %     Oxygen Saturation (Exercise) 95 % 95 % 95 %     Oxygen Saturation (Exit) 97 % 96 % 97 %     Rating of Perceived Exertion (Exercise) 13 13 12      Perceived Dyspnea (Exercise) 13 13 13      Duration Continue with 30 min of aerobic exercise without signs/symptoms of physical distress. Continue with 30 min of aerobic exercise without signs/symptoms of physical distress. Continue with 30 min of aerobic exercise without signs/symptoms of physical distress.     Intensity THRR unchanged THRR unchanged THRR unchanged       Progression   Progression Continue to progress workloads to maintain intensity without signs/symptoms of physical distress. Continue to progress workloads to maintain intensity without signs/symptoms of physical distress. Continue to progress workloads to maintain intensity without signs/symptoms of physical distress.       Resistance Training   Training Prescription Yes Yes Yes     Weight 3 lbs 3 lbs 3     Reps 10-15 10-15 10-15     Time 10 Minutes 10 Minutes 10 Minutes       Treadmill   MPH 1.5 1.6 1.7     Grade 0 0 0     Minutes 17 17 17      METs 2.15 2.23 2.3       NuStep   Level 1 1 2       SPM 83 100 94     Minutes 22 22 22      METs 1.8 1.9 1.8              Exercise Comments:   Exercise Goals and Review:   Exercise Goals     Row Name 03/20/21 1105 04/15/21 1349 05/08/21 1037         Exercise Goals   Increase Physical Activity Yes Yes Yes     Intervention Provide advice, education, support and counseling about physical activity/exercise needs.;Develop an individualized exercise prescription for aerobic and resistive training based on initial evaluation findings, risk stratification, comorbidities and participant's personal goals. Provide advice, education, support and counseling about physical activity/exercise needs.;Develop an individualized exercise prescription for aerobic and resistive training based on initial evaluation findings, risk stratification, comorbidities and participant's personal goals. Provide advice, education, support and counseling about physical activity/exercise needs.;Develop an individualized exercise prescription for aerobic and resistive training based on initial evaluation findings, risk stratification, comorbidities and participant's personal goals.     Expected Outcomes Short Term: Attend rehab on a regular basis to increase amount of physical activity.;Long Term: Exercising regularly at least 3-5 days a week.;Long Term: Add in home exercise to make exercise part of routine and to increase amount of physical activity. Short Term: Attend rehab on a regular basis to increase amount of physical activity.;Long Term: Exercising regularly at least 3-5 days a week.;Long Term: Add in home exercise to  make exercise part of routine and to increase amount of physical activity. Short Term: Attend rehab on a regular basis to increase amount of physical activity.;Long Term: Exercising regularly at least 3-5 days a week.;Long Term: Add in home exercise to make exercise part of routine and to increase amount of physical activity.     Increase Strength and  Stamina Yes Yes --     Intervention Provide advice, education, support and counseling about physical activity/exercise needs.;Develop an individualized exercise prescription for aerobic and resistive training based on initial evaluation findings, risk stratification, comorbidities and participant's personal goals. Provide advice, education, support and counseling about physical activity/exercise needs.;Develop an individualized exercise prescription for aerobic and resistive training based on initial evaluation findings, risk stratification, comorbidities and participant's personal goals. Provide advice, education, support and counseling about physical activity/exercise needs.;Develop an individualized exercise prescription for aerobic and resistive training based on initial evaluation findings, risk stratification, comorbidities and participant's personal goals.     Expected Outcomes Short Term: Increase workloads from initial exercise prescription for resistance, speed, and METs.;Short Term: Perform resistance training exercises routinely during rehab and add in resistance training at home;Long Term: Improve cardiorespiratory fitness, muscular endurance and strength as measured by increased METs and functional capacity (6MWT) Short Term: Increase workloads from initial exercise prescription for resistance, speed, and METs.;Short Term: Perform resistance training exercises routinely during rehab and add in resistance training at home;Long Term: Improve cardiorespiratory fitness, muscular endurance and strength as measured by increased METs and functional capacity (6MWT) Short Term: Increase workloads from initial exercise prescription for resistance, speed, and METs.;Short Term: Perform resistance training exercises routinely during rehab and add in resistance training at home;Long Term: Improve cardiorespiratory fitness, muscular endurance and strength as measured by increased METs and functional capacity (6MWT)      Able to understand and use rate of perceived exertion (RPE) scale Yes Yes Yes     Intervention Provide education and explanation on how to use RPE scale Provide education and explanation on how to use RPE scale Provide education and explanation on how to use RPE scale     Expected Outcomes Short Term: Able to use RPE daily in rehab to express subjective intensity level;Long Term:  Able to use RPE to guide intensity level when exercising independently Short Term: Able to use RPE daily in rehab to express subjective intensity level;Long Term:  Able to use RPE to guide intensity level when exercising independently Short Term: Able to use RPE daily in rehab to express subjective intensity level;Long Term:  Able to use RPE to guide intensity level when exercising independently     Able to understand and use Dyspnea scale Yes Yes Yes     Intervention Provide education and explanation on how to use Dyspnea scale Provide education and explanation on how to use Dyspnea scale Provide education and explanation on how to use Dyspnea scale     Expected Outcomes Short Term: Able to use Dyspnea scale daily in rehab to express subjective sense of shortness of breath during exertion;Long Term: Able to use Dyspnea scale to guide intensity level when exercising independently Short Term: Able to use Dyspnea scale daily in rehab to express subjective sense of shortness of breath during exertion;Long Term: Able to use Dyspnea scale to guide intensity level when exercising independently Short Term: Able to use Dyspnea scale daily in rehab to express subjective sense of shortness of breath during exertion;Long Term: Able to use Dyspnea scale to guide intensity level when exercising independently  Knowledge and understanding of Target Heart Rate Range (THRR) Yes Yes Yes     Intervention Provide education and explanation of THRR including how the numbers were predicted and where they are located for reference Provide education  and explanation of THRR including how the numbers were predicted and where they are located for reference Provide education and explanation of THRR including how the numbers were predicted and where they are located for reference     Expected Outcomes Short Term: Able to state/look up THRR;Long Term: Able to use THRR to govern intensity when exercising independently;Short Term: Able to use daily as guideline for intensity in rehab Short Term: Able to state/look up THRR;Long Term: Able to use THRR to govern intensity when exercising independently;Short Term: Able to use daily as guideline for intensity in rehab Short Term: Able to state/look up THRR;Long Term: Able to use THRR to govern intensity when exercising independently;Short Term: Able to use daily as guideline for intensity in rehab     Understanding of Exercise Prescription Yes Yes Yes     Intervention Provide education, explanation, and written materials on patient's individual exercise prescription Provide education, explanation, and written materials on patient's individual exercise prescription Provide education, explanation, and written materials on patient's individual exercise prescription     Expected Outcomes Short Term: Able to explain program exercise prescription;Long Term: Able to explain home exercise prescription to exercise independently Short Term: Able to explain program exercise prescription;Long Term: Able to explain home exercise prescription to exercise independently Short Term: Able to explain program exercise prescription;Long Term: Able to explain home exercise prescription to exercise independently              Exercise Goals Re-Evaluation :  Exercise Goals Re-Evaluation     Row Name 04/15/21 1350 05/08/21 1138           Exercise Goal Re-Evaluation   Exercise Goals Review Increase Physical Activity;Able to understand and use Dyspnea scale;Understanding of Exercise Prescription;Increase Strength and  Stamina;Knowledge and understanding of Target Heart Rate Range (THRR);Able to understand and use rate of perceived exertion (RPE) scale Increase Physical Activity;Increase Strength and Stamina;Able to understand and use rate of perceived exertion (RPE) scale;Knowledge and understanding of Target Heart Rate Range (THRR);Able to check pulse independently;Understanding of Exercise Prescription      Comments Pt has attended 7 exercise sessions of pulmonary rehab. She has been progressing well and slowly progressing her workloads. She does report frustration with not being able to lose weight. She reportst that her strength and stamina are beginning to improve. She is currently exercising at 2.23 METs. Will continue to monitor and progress as able. Pt has completed 9 sesisons of pulmonary rehab. She has been progressing slowly with her workloads. She tends to miss class due to other medical issues that flar up and cause her to feel unwell. She is currently exercising at 2.3 METs on the TM. Will continue to monitor and progress as able.      Expected Outcomes Through exercise at rehab and at home, the patient will meet their stated goals. Through exercise at rehab and at home, the patient will meet their stated goals.               Discharge Exercise Prescription (Final Exercise Prescription Changes):  Exercise Prescription Changes - 05/08/21 1144       Response to Exercise   Blood Pressure (Admit) 98/68    Blood Pressure (Exercise) 112/60    Blood Pressure (Exit) 110/60  Heart Rate (Admit) 78 bpm    Heart Rate (Exercise) 85 bpm    Heart Rate (Exit) 80 bpm    Oxygen Saturation (Admit) 97 %    Oxygen Saturation (Exercise) 95 %    Oxygen Saturation (Exit) 97 %    Rating of Perceived Exertion (Exercise) 12    Perceived Dyspnea (Exercise) 13    Duration Continue with 30 min of aerobic exercise without signs/symptoms of physical distress.    Intensity THRR unchanged      Progression    Progression Continue to progress workloads to maintain intensity without signs/symptoms of physical distress.      Resistance Training   Training Prescription Yes    Weight 3    Reps 10-15    Time 10 Minutes      Treadmill   MPH 1.7    Grade 0    Minutes 17    METs 2.3      NuStep   Level 2    SPM 94    Minutes 22    METs 1.8             Nutrition:  Target Goals: Understanding of nutrition guidelines, daily intake of sodium 1500mg , cholesterol 200mg , calories 30% from fat and 7% or less from saturated fats, daily to have 5 or more servings of fruits and vegetables.  Biometrics:  Pre Biometrics - 03/20/21 1110       Pre Biometrics   Height 5\' 3"  (1.6 m)    Weight 78.9 kg    Waist Circumference 41 inches    Hip Circumference 45.5 inches    Waist to Hip Ratio 0.9 %    BMI (Calculated) 30.82    Triceps Skinfold 35 mm    % Body Fat 43.9 %    Grip Strength 21.2 kg    Flexibility 0 in    Single Leg Stand 22.64 seconds              Nutrition Therapy Plan and Nutrition Goals:  Nutrition Therapy & Goals - 05/07/21 1332       Personal Nutrition Goals   Comments Nutrition handout given to patient on how to choose heart healthy meals.   We provide 2 educational sessions on heart healthy nutruition and assistance with registered dietitian  referal if patient is interested.  She verbally understands and no questions at this time.      Intervention Plan   Intervention Nutrition handout(s) given to patient.    Expected Outcomes Short Term Goal: Understand basic principles of dietary content, such as calories, fat, sodium, cholesterol and nutrients.             Nutrition Assessments:  Nutrition Assessments - 03/20/21 0858       MEDFICTS Scores   Pre Score 0            MEDIFICTS Score Key: ?70 Need to make dietary changes  40-70 Heart Healthy Diet ? 40 Therapeutic Level Cholesterol Diet   Picture Your Plate Scores: [id number] Unhealthy dietary pattern  with much room for improvement. 41-50 Dietary pattern unlikely to meet recommendations for good health and room for improvement. 51-60 More healthful dietary pattern, with some room for improvement.  >60 Healthy dietary pattern, although there may be some specific behaviors that could be improved.    Nutrition Goals Re-Evaluation:   Nutrition Goals Discharge (Final Nutrition Goals Re-Evaluation):   Psychosocial: Target Goals: Acknowledge presence or absence of significant depression and/or stress, maximize coping skills, provide positive  support system. Participant is able to verbalize types and ability to use techniques and skills needed for reducing stress and depression.  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 This is very stressful for her. She is also stressed about the long term effects of having COVID which has increased her SOB and activity tolerance.      Family Dynamics   Good Support System? Yes    Comments Her older sister is her main support system.      Barriers   Psychosocial barriers to participate in program The patient should benefit from training in stress management and relaxation.;Psychosocial barriers identified (see note)      Screening Interventions   Interventions Encouraged to exercise;To provide support and resources with identified psychosocial needs;Provide feedback about the scores to participant    Expected Outcomes Long Term goal: The participant improves quality of Life and PHQ9 Scores as seen by post scores and/or verbalization of changes;Short Term goal: Identification and review with participant of any Quality of Life or Depression concerns found by scoring the  questionnaire.;Long Term Goal: Stressors or current issues are controlled or eliminated.             Quality of Life Scores:  Quality of Life - 03/20/21 1111       Quality of Life   Select Quality of Life      Quality of Life Scores   Health/Function Pre 11.97 %    Socioeconomic Pre 28.17 %    Psych/Spiritual Pre 19.5 %    Family Pre 21 %    GLOBAL Pre 17.78 %            Scores of 19 and below usually indicate a poorer quality of life in these areas.  A difference of  2-3 points is a clinically meaningful difference.  A difference of 2-3 points in the total score of the Quality of Life Index has been associated with significant improvement in overall quality of life, self-image, physical symptoms, and general health in studies assessing change in quality of life.   PHQ-9: Recent Review Flowsheet Data     Depression screen [facility] 2/9 03/20/2021 02/06/2020 12/14/2018   Decreased Interest 1 2 0   Down, Depressed, Hopeless 1 2 0   PHQ - 2 Score 2 4 0   Altered sleeping 0 2 -   Tired, decreased energy 2 2 -   Change in appetite 1 2 -   Feeling bad or failure about yourself  0 1 -   Trouble concentrating 1 1 -   Moving slowly or fidgety/restless 0 0 -   Suicidal thoughts 0 0 -   PHQ-9 Score 6 12 -   Difficult doing work/chores Somewhat difficult - -      Interpretation of Total Score  Total Score Depression Severity:  1-4 = Minimal depression, 5-9 = Mild depression, 10-14 = Moderate depression, 15-19 = Moderately severe depression, 20-27 = Severe depression   Psychosocial Evaluation and Intervention:  Psychosocial Evaluation - 03/20/21 1113       Psychosocial Evaluation & Interventions   Interventions Stress management education;Relaxation education;Encouraged to exercise with the program and follow exercise prescription    Comments Pt  has no barriers to completing pulmonary rehab. She does have anxiety which is treated with clonazepam 0.5 mg PRN. She reports that this  does help "take the edge off". She did score a 6 on her PHQ-9. A lot of this she attributes to her health. She has had COVID-19 3 times in the past 4 months and this has caused her to be SOB with minimal exertion and fatigue easily. She also has several autoimmune diseases that attribute to this and celiac disease. She also reports her health as a source of stress. 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 He has suffered 2 strokes in the past couple of years and this has caused him to experience frequent confusion, agitation, and has impacted some of his motor skills and vision. She reports this to be very stressful and at times overwhelming. She does have neighbors that she socializes with, but does not bring her problems to them as she feels that they would overwhelm them. Her main support system is her sister, but she lives in [place], so they can only communicate on the phone. She has made an appointment with [facility] in [place] to talk with a therapist. She is hopeful that this will be helpful for her. Her goals while in the program are to lose weight, improve her SOB, and to improve her overall well-being. She is excited about starting the program and is thankful to be able to do it.    Expected Outcomes Pt's anxiety will continue to be treated with clonazepam and she will see a therapist to reduce some of her stress. She will have no other identifiable psychosocial issues.    Continue Psychosocial Services  Follow up required by staff             Psychosocial Re-Evaluation:  Psychosocial Re-Evaluation     [place] Name 04/09/21 0817 05/07/21 1328           Psychosocial Re-Evaluation   Current issues with Current Stress Concerns;Current Sleep Concerns --      Comments Patient is new to the program attenting 5 sessions.  Her initial QOL score is 18 and her PHQ-9 score is 6.  She seems to enjoy coming to the program and interacts well with class and staff  members.  She has a positve outlook , attitude and will continue to encouage her to work hard in [place].  We will continue to monitor as she progresses. Patient has completed 8  sessions and continues to have no psychosocial issues identified.  She seems to enjoy coming to the program and interacts well with class and staff members.  She has a positve outlook , attitude and will continue to encouage her to work hard in [place].  We will continue to monitor as she progresses in the program.      Expected Outcomes Patient will have no psychosocial barries identified at discharge. Patient will have no psychosocial barries identified at discharge.      Interventions Encouraged to attend Pulmonary Rehabilitation for the exercise;Relaxation education;Stress management education Encouraged to attend Pulmonary Rehabilitation for the exercise;Relaxation education;Stress management education      Continue Psychosocial Services  --  She reports that she is schedule to meet with a therapist. No Follow up required        Initial Review   Source of Stress Concerns Chronic Illness;Family Chronic Illness;Family      Comments She is the full time care giver for her husband  who has had two strokes. This is very stressful for her. She is also stressed about the long term effects of having COVID which has increased her SOB and activity tolerance. --               Psychosocial Discharge (Final Psychosocial Re-Evaluation):  Psychosocial Re-Evaluation - 05/07/21 1328       Psychosocial Re-Evaluation   Comments Patient has completed 8  sessions and continues to have no psychosocial issues identified.  She seems to enjoy coming to the program and interacts well with class and staff members.  She has a positve outlook , attitude and will continue to encouage her to work hard in [place].  We will continue to monitor as she progresses in the program.    Expected Outcomes Patient will have no psychosocial barries identified at discharge.     Interventions Encouraged to attend Pulmonary Rehabilitation for the exercise;Relaxation education;Stress management education    Continue Psychosocial Services  No Follow up required      Initial Review   Source of Stress Concerns Chronic Illness;Family              Education: Education Goals: Education classes will be provided on a weekly basis, covering required topics. Participant will state understanding/return demonstration of topics presented.  Learning Barriers/Preferences:  Learning Barriers/Preferences - 03/20/21 0905       Learning Barriers/Preferences   Learning Barriers None    Learning Preferences Skilled Demonstration             Education Topics: How Lungs Work and Diseases: - Discuss the anatomy of the lungs and diseases that can affect the lungs, such as COPD.   Exercise: -Discuss the importance of exercise, FITT principles of exercise, normal and abnormal responses to exercise, and how to exercise safely.   Environmental Irritants: -Discuss types of environmental irritants and how to limit exposure to environmental irritants. Flowsheet Row PULMONARY REHAB OTHER RESPIRATORY [facility]  Date 03/27/21  Educator DF  Instruction Review Code 2- Demonstrated Understanding       Meds/Inhalers and oxygen: - Discuss respiratory medications, definition of an inhaler and oxygen, and the proper way to use an inhaler and oxygen. Flowsheet Row PULMONARY REHAB OTHER RESPIRATORY [facility]  Date 04/03/21  Educator pb       Energy Saving Techniques: - Discuss methods to conserve energy and decrease shortness of breath when performing activities of daily living.  Flowsheet Row PULMONARY REHAB OTHER RESPIRATORY [facility]  Date 04/10/21  Educator DF  Instruction Review Code 1- Verbalizes Understanding       Bronchial Hygiene /  Breathing Techniques: - Discuss breathing mechanics, pursed-lip breathing technique,  proper posture, effective ways to clear airways, and other functional breathing techniques   Cleaning Equipment: - Provides group verbal and written instruction about the health risks of elevated stress, cause of high stress, and healthy ways to reduce stress.   Nutrition I: Fats: - Discuss the types of cholesterol, what cholesterol does to the body, and how cholesterol levels can be controlled.   Nutrition II: Labels: -Discuss the different components of food labels and how to read food labels. Flowsheet Row PULMONARY REHAB OTHER RESPIRATORY [facility]  Date 05/08/21  Educator DF  Instruction Review Code 2- Demonstrated Understanding       Respiratory Infections: - Discuss the signs and symptoms of respiratory infections, ways  to prevent respiratory infections, and the importance of seeking medical treatment when having a respiratory infection.   Stress I: Signs and Symptoms: - Discuss the causes of stress, how stress may lead to anxiety and depression, and ways to limit stress.   Stress II: Relaxation: -Discuss relaxation techniques to limit stress.   Oxygen for Home/Travel: - Discuss how to prepare for travel when on oxygen and proper ways to transport and store oxygen to ensure safety.   Knowledge Questionnaire Score:  Knowledge Questionnaire Score - 03/20/21 0905       Knowledge Questionnaire Score   Pre Score 15/18             Core Components/Risk Factors/Patient Goals at Admission:  Personal Goals and Risk Factors at Admission - 03/20/21 0908       Core Components/Risk Factors/Patient Goals on Admission    Weight Management Yes;Weight Loss;Obesity    Intervention Weight Management: Develop a combined nutrition and exercise program designed to reach desired caloric intake, while maintaining appropriate intake of nutrient and  fiber, sodium and fats, and appropriate energy expenditure required for the weight goal.;Weight Management: Provide education and appropriate resources to help participant work on and attain dietary goals.;Weight Management/Obesity: Establish reasonable short term and long term weight goals.;Obesity: Provide education and appropriate resources to help participant work on and attain dietary goals.    Expected Outcomes Short Term: Continue to assess and modify interventions until short term weight is achieved;Long Term: Adherence to nutrition and physical activity/exercise program aimed toward attainment of established weight goal;Weight Loss: Understanding of general recommendations for a balanced deficit meal plan, which promotes 1-2 lb weight loss per week and includes a negative energy balance of [phone] kcal/d;Understanding recommendations for meals to include 15-35% energy as protein, 25-35% energy from fat, 35-60% energy from carbohydrates, less than 200mg  of dietary cholesterol, 20-35 gm of total fiber daily;Understanding of distribution of calorie intake throughout the day with the consumption of 4-5 meals/snacks;Weight Maintenance: Understanding of the daily nutrition guidelines, which includes 25-35% calories from fat, 7% or less cal from saturated fats, less than 200mg  cholesterol, less than 1.5gm of sodium, & 5 or more servings of fruits and vegetables daily    Improve shortness of breath with ADL's Yes    Intervention Provide education, individualized exercise plan and daily activity instruction to help decrease symptoms of SOB with activities of daily living.    Expected Outcomes Short Term: Improve cardiorespiratory fitness to achieve a reduction of symptoms when performing ADLs;Long Term: Be able to perform more ADLs without symptoms or delay the onset of symptoms    Personal Goal Other Yes    Personal Goal Improve well being overall    Intervention Attend pulmonary rehab, begin home exercise,  educational materials reviewed, social support given at rehab.    Expected Outcomes Her overall well being will be improved.             Core Components/Risk Factors/Patient Goals Review:   Goals and Risk Factor Review     Row Name 04/09/21 0840 05/07/21 1335           Core Components/Risk Factors/Patient Goals Review   Personal Goals Review Weight Management/Obesity;Improve shortness of breath with ADL's;Stress Weight Management/Obesity;Improve shortness of breath with ADL's;Stress      Review Patient is new to the program completing 5 sessions.  Plans to have stress test January 30.  Her CT scan was noted to have calcification. Plan to evaluate her  for coronary atherosclerosis.  She is doing well in the program with progression and consistent attendance.  Her personal goals are to lose wwight, she is maintaining her weight at 78.5 kg.  She is improving her SOB and is exercising without oxygen at this time, O2 sats average while exercising are 95%- 97% . We will continue to monitor as she works toward meeting these goals. Patient has completed 8  sessions.  She is doing well in the program with progression and consistent attendance.  Her personal goals are to lose weight, she is maintaining her weight at 78.kg.  She is improving her SOB and is exercising without oxygen at this time, O2 sats average while exercising are 95%- 97% . We will continue to monitor and encouage her as she works toward meeting these goals.      Expected Outcomes Patient will complete the program meeting both program and personal goals. Patient will complete the program meeting both program and personal goals.               Core Components/Risk Factors/Patient Goals at Discharge (Final Review):   Goals and Risk Factor Review - 05/07/21 1335       Core Components/Risk Factors/Patient Goals Review   Personal Goals Review Weight Management/Obesity;Improve shortness of breath with ADL's;Stress    Review Patient  has completed 8  sessions.  She is doing well in the program with progression and consistent attendance.  Her personal goals are to lose weight, she is maintaining her weight at 78.kg.  She is improving her SOB and is exercising without oxygen at this time, O2 sats average while exercising are 95%- 97% . We will continue to monitor and encouage her as she works toward meeting these goals.    Expected Outcomes Patient will complete the program meeting both program and personal goals.             ITP Comments:   Comments: ITP REVIEW Pt is making expected progress toward pulmonary rehab goals after completing 10 sessions. Recommend continued exercise, life style modification, education, and utilization of breathing techniques to increase stamina and strength and decrease shortness of breath with exertion.

## 2021-05-14 NOTE — Progress Notes (Signed)
? ?WELL-WOMAN EXAMINATION ?Patient name: Katie Davis MRN [id number]  Date of birth: [birth year] ?Chief Complaint:   ?Gynecologic Exam (Still having cramping here and there) ? ?History of Present Illness:   ?Man Bonneau is a 66 y.o. G4P0030 Caucasian female being seen today for a routine well-woman exam.  ?Current complaints: occ pelvic cramping, on & off for years, had pelvic u/s 2021 for same- had 1.0cm intramural fibroid Rt fundus, 0.8cm nabothian cyst, endometrium 36mm. Reports its hard to tell if gyn or GI in nature, wants to schedule another pelvic u/s to assess. No vaginal beeding. Lot of GI issues. Has had covid multiple times, has breathing/lung issues since.  ? ?PCP: Alphonzo Grieve      ?No LMP recorded. Patient is postmenopausal. ?The current method of family planning is  abstinence and postmenopausal .  ?Last pap 02/06/20. Results were: NILM w/ HRHPV positive: other (not 16, 18/45). H/O abnormal pap: yes H/O cervical laser ablation, 2017: neg w/ -HRHPV, 2018: ASCUS w/ -HRHPV, 2019: neg w/ +HRHPV ?Last mammogram: 12/18/20. Results were: normal. Family h/o breast cancer: pt's mom had 1/4 of breast removed in her 30s- not sure if it was cancer ?Last colonoscopy: 06/10/20. Results were: diverticulosis and internal hemorrhoids Family h/o colorectal cancer: no ? ?Depression screen [facility] 2/9 05/14/2021 03/20/2021 02/06/2020 12/14/2018  ?Decreased Interest 1 1 2  0  ?Down, Depressed, Hopeless 1 1 2  0  ?PHQ - 2 Score 2 2 4  0  ?Altered sleeping 1 0 2 -  ?Tired, decreased energy 3 2 2  -  ?Change in appetite 1 1 2  -  ?Feeling bad or failure about yourself  0 0 1 -  ?Trouble concentrating 1 1 1  -  ?Moving slowly or fidgety/restless 0 0 0 -  ?Suicidal thoughts 0 0 0 -  ?PHQ-9 Score 8 6 12  -  ?Difficult doing work/chores - Somewhat difficult - -  ? ?  ?GAD 7 : Generalized Anxiety Score 05/14/2021 02/06/2020  ?Nervous, Anxious, on Edge 1 1  ?Control/stop worrying 1 1  ?Worry too much - different things 1 1  ?Trouble relaxing 0 1   ?Restless 0 0  ?Easily annoyed or irritable 0 0  ?Afraid - awful might happen 0 0  ?Total GAD 7 Score 3 4  ? ? ? ?Review of Systems:   ?Pertinent items are noted in HPI ?Denies any headaches, blurred vision, fatigue, shortness of breath, chest pain, abdominal pain, abnormal vaginal discharge/itching/odor/irritation, problems with periods, bowel movements, urination, or intercourse unless otherwise stated above. ?Pertinent History Reviewed:  ?Reviewed past medical,surgical, social and family history.  ?Reviewed problem list, medications and allergies. ?Physical Assessment:  ? ?Vitals:  ? 05/14/21 1047  ?BP: 119/66  ?Pulse: 69  ?Weight: 172 lb (78 kg)  ?Height: 5\' 3"  (1.6 m)  ?Body mass index is 30.47 kg/m?. ?  ?     Physical Examination:  ? General appearance - well appearing, and in no distress ? Mental status - alert, oriented to person, place, and time ? Psych:  She has a normal mood and affect ? Skin - warm and dry, normal color, no suspicious lesions noted ? Chest - effort normal, all lung fields clear to auscultation bilaterally ? Heart - normal rate and regular rhythm ? Neck:  midline trachea, no thyromegaly or nodules ? Breasts - breasts appear normal, no suspicious masses, no skin or nipple changes or  axillary nodes ? Abdomen - soft, nontender, nondistended, no masses or organomegaly ? Pelvic - VULVA: normal appearing vulva with  no masses, tenderness or lesions  VAGINA: normal appearing vagina with normal color and discharge, no lesions  CERVIX: normal appearing cervix without discharge or lesions, no CMT ? Thin prep pap is done w/ HR HPV cotesting ? UTERUS: uterus is felt to be normal size, shape, consistency and nontender  ? ADNEXA: No adnexal masses or tenderness noted. ? Extremities:  No swelling or varicosities noted ? ?Chaperone: Malachy Mood   ? ?No results found for this or any previous visit (from the past 24 hour(s)).  ?Assessment & Plan:  ?1) Well-Woman Exam ? ?2) Postmenopausal ? ?3) Cramping>  unsure if GI or gyn in nature, wants pelvic u/s ? ?4) H/O abnormal pap> repeated today ? ?Labs/procedures today: pap ? ?Mammogram:  Oct , or sooner if problems ?Colonoscopy: per GI, or sooner if problems ? ?Orders Placed This Encounter  ?Procedures  ? US PELVIS (TRANSABDOMINAL ONLY)  ? US PELVIS TRANSVAGINAL NON-OB (TV ONLY)  ? ? ?Meds: No orders of the defined types were placed in this encounter. ? ? ?Follow-up: Return for 1st available, US:GYN and f/u after . ? ?Cheral Marker CNM, WHNP-BC ?05/14/2021 ?1:02 PM ? ?

## 2021-05-15 ENCOUNTER — Encounter (HOSPITAL_COMMUNITY): Payer: Medicare Other

## 2021-05-16 LAB — CYTOLOGY - PAP
Comment: NEGATIVE
Diagnosis: NEGATIVE
High risk HPV: NEGATIVE

## 2021-05-19 ENCOUNTER — Encounter: Payer: Self-pay | Admitting: Women's Health

## 2021-05-20 ENCOUNTER — Encounter (HOSPITAL_COMMUNITY): Payer: Medicare Other

## 2021-05-22 ENCOUNTER — Encounter (HOSPITAL_COMMUNITY): Payer: Medicare Other

## 2021-05-23 DIAGNOSIS — E039 Hypothyroidism, unspecified: Secondary | ICD-10-CM | POA: Diagnosis not present

## 2021-05-23 NOTE — Progress Notes (Signed)
Discharge Progress Report  Patient Details  Name: Katie Davis MRN: [id number] Date of Birth: Jun 07, 1955 Referring Provider:   Flowsheet Row PULMONARY REHAB OTHER RESP ORIENTATION from 03/20/2021 in [facility]  Referring Provider Dr. Craige Cotta        Number of Visits: 10  Reason for Discharge:  Early Exit:  Personal  Smoking History:  Social History   Tobacco Use  Smoking Status Never  Smokeless Tobacco Never    Diagnosis:  DOE (dyspnea on exertion)  Post covid-19 condition, unspecified  ADL [facility]:  Pulmonary Assessment Scores     Row Name 03/20/21 0848         ADL [facility]   SOB Score total 38     Rest 0     Walk 3     Stairs 4     Bath 1     Dress 1     Shop 3       CAT Score   CAT Score 18       mMRC Score   mMRC Score 3              Initial Exercise Prescription:  Initial Exercise Prescription - 03/20/21 1100       Date of Initial Exercise RX and Referring Provider   Date 03/20/21    Referring Provider Dr. Craige Cotta    Expected Discharge Date 07/24/21      Treadmill   MPH 1    Grade 0    Minutes 17      NuStep   Level 1    SPM 80    Minutes 22      Prescription Details   Frequency (times per week) 2    Duration Progress to 30 minutes of continuous aerobic without signs/symptoms of physical distress      Intensity   THRR 40-80% of Max Heartrate 62-125    Ratings of Perceived Exertion 11-13    Perceived Dyspnea 0-4      Resistance Training   Training Prescription Yes    Weight 2 lbs    Reps 10-15             Discharge Exercise Prescription (Final Exercise Prescription Changes):  Exercise Prescription Changes - 05/08/21 1144       Response to Exercise   Blood Pressure (Admit) 98/68    Blood Pressure (Exercise) 112/60    Blood Pressure (Exit) 110/60    Heart Rate (Admit) 78 bpm    Heart Rate (Exercise) 85 bpm    Heart Rate (Exit) 80 bpm    Oxygen Saturation (Admit) 97 %    Oxygen Saturation (Exercise)  95 %    Oxygen Saturation (Exit) 97 %    Rating of Perceived Exertion (Exercise) 12    Perceived Dyspnea (Exercise) 13    Duration Continue with 30 min of aerobic exercise without signs/symptoms of physical distress.    Intensity THRR unchanged      Progression   Progression Continue to progress workloads to maintain intensity without signs/symptoms of physical distress.      Resistance Training   Training Prescription Yes    Weight 3    Reps 10-15    Time 10 Minutes      Treadmill   MPH 1.7    Grade 0    Minutes 17    METs 2.3      NuStep   Level 2    SPM 94    Minutes 22    METs  1.8             Functional Capacity:  6 Minute Walk     Row Name 03/20/21 1102         6 Minute Walk   Phase Initial     Distance 1250 feet     Walk Time 6 minutes     # of Rest Breaks 0     MPH 2.37     METS 2.76     RPE 13     Perceived Dyspnea  15     VO2 Peak 9.68     Symptoms No     Resting HR 68 bpm     Resting BP 116/78     Resting Oxygen Saturation  95 %     Exercise Oxygen Saturation  during 6 min walk 96 %     Max Ex. HR 86 bpm     Max Ex. BP 132/76     2 Minute Post BP 110/78       Interval HR   1 Minute HR 76     2 Minute HR 80     3 Minute HR 81     4 Minute HR 86     5 Minute HR 80     6 Minute HR 77     2 Minute Post HR 69     Interval Heart Rate? Yes       Interval Oxygen   Interval Oxygen? Yes     Baseline Oxygen Saturation % 95 %     1 Minute Oxygen Saturation % 96 %     1 Minute Liters of Oxygen 0 L     2 Minute Oxygen Saturation % 96 %     2 Minute Liters of Oxygen 0 L     3 Minute Oxygen Saturation % 97 %     3 Minute Liters of Oxygen 0 L     4 Minute Oxygen Saturation % 97 %     4 Minute Liters of Oxygen 0 L     5 Minute Oxygen Saturation % 99 %     5 Minute Liters of Oxygen 0 L     6 Minute Oxygen Saturation % 99 %     6 Minute Liters of Oxygen 0 L     2 Minute Post Oxygen Saturation % 99 %     2 Minute Post Liters of Oxygen 0 L               Psychological, QOL, Others - Outcomes: PHQ 2/9: Depression screen [facility]HQ 2/9 05/14/2021 03/20/2021 02/06/2020 12/14/2018  Decreased Interest 1 1 2  0  Down, Depressed, Hopeless 1 1 2  0  PHQ - 2 Score 2 2 4  0  Altered sleeping 1 0 2 -  Tired, decreased energy 3 2 2  -  Change in appetite 1 1 2  -  Feeling bad or failure about yourself  0 0 1 -  Trouble concentrating 1 1 1  -  Moving slowly or fidgety/restless 0 0 0 -  Suicidal thoughts 0 0 0 -  PHQ-9 Score 8 6 12  -  Difficult doing work/chores - Somewhat difficult - -    Quality of Life:  Quality of Life - 03/20/21 1111       Quality of Life   Select Quality of Life      Quality of Life Scores   Health/Function Pre 11.97 %    Socioeconomic Pre 28.17 %  Psych/Spiritual Pre 19.5 %    Family Pre 21 %    GLOBAL Pre 17.78 %             Personal Goals: Goals established at orientation with interventions provided to work toward goal.  Personal Goals and Risk Factors at Admission - 03/20/21 0908       Core Components/Risk Factors/Patient Goals on Admission    Weight Management Yes;Weight Loss;Obesity    Intervention Weight Management: Develop a combined nutrition and exercise program designed to reach desired caloric intake, while maintaining appropriate intake of nutrient and fiber, sodium and fats, and appropriate energy expenditure required for the weight goal.;Weight Management: Provide education and appropriate resources to help participant work on and attain dietary goals.;Weight Management/Obesity: Establish reasonable short term and long term weight goals.;Obesity: Provide education and appropriate resources to help participant work on and attain dietary goals.    Expected Outcomes Short Term: Continue to assess and modify interventions until short term weight is achieved;Long Term: Adherence to nutrition and physical activity/exercise program aimed toward attainment of established weight goal;Weight Loss:  Understanding of general recommendations for a balanced deficit meal plan, which promotes 1-2 lb weight loss per week and includes a negative energy balance of [phone] kcal/d;Understanding recommendations for meals to include 15-35% energy as protein, 25-35% energy from fat, 35-60% energy from carbohydrates, less than 200mg  of dietary cholesterol, 20-35 gm of total fiber daily;Understanding of distribution of calorie intake throughout the day with the consumption of 4-5 meals/snacks;Weight Maintenance: Understanding of the daily nutrition guidelines, which includes 25-35% calories from fat, 7% or less cal from saturated fats, less than 200mg  cholesterol, less than 1.5gm of sodium, & 5 or more servings of fruits and vegetables daily    Improve shortness of breath with ADL's Yes    Intervention Provide education, individualized exercise plan and daily activity instruction to help decrease symptoms of SOB with activities of daily living.    Expected Outcomes Short Term: Improve cardiorespiratory fitness to achieve a reduction of symptoms when performing ADLs;Long Term: Be able to perform more ADLs without symptoms or delay the onset of symptoms    Personal Goal Other Yes    Personal Goal Improve well being overall    Intervention Attend pulmonary rehab, begin home exercise, educational materials reviewed, social support given at rehab.    Expected Outcomes Her overall well being will be improved.              Personal Goals Discharge:  Goals and Risk Factor Review     Row Name 04/09/21 0840 05/07/21 1335           Core Components/Risk Factors/Patient Goals Review   Personal Goals Review Weight Management/Obesity;Improve shortness of breath with ADL's;Stress Weight Management/Obesity;Improve shortness of breath with ADL's;Stress      Review Patient is new to the program completing 5 sessions.  Plans to have stress test January 30.  Her CT scan was noted to have calcification. Plan to evaluate  her  for coronary atherosclerosis.  She is doing well in the program with progression and consistent attendance.  Her personal goals are to lose wwight, she is maintaining her weight at 78.5 kg.  She is improving her SOB and is exercising without oxygen at this time, O2 sats average while exercising are 95%- 97% . We will continue to monitor as she works toward meeting these goals. Patient has completed 8  sessions.  She is doing well in the program with progression and consistent attendance.  Her personal goals are to lose weight, she is maintaining her weight at 78.kg.  She is improving her SOB and is exercising without oxygen at this time, O2 sats average while exercising are 95%- 97% . We will continue to monitor and encouage her as she works toward meeting these goals.      Expected Outcomes Patient will complete the program meeting both program and personal goals. Patient will complete the program meeting both program and personal goals.               Exercise Goals and Review:  Exercise Goals     Row Name 03/20/21 1105 04/15/21 1349 05/08/21 1037         Exercise Goals   Increase Physical Activity Yes Yes Yes     Intervention Provide advice, education, support and counseling about physical activity/exercise needs.;Develop an individualized exercise prescription for aerobic and resistive training based on initial evaluation findings, risk stratification, comorbidities and participant's personal goals. Provide advice, education, support and counseling about physical activity/exercise needs.;Develop an individualized exercise prescription for aerobic and resistive training based on initial evaluation findings, risk stratification, comorbidities and participant's personal goals. Provide advice, education, support and counseling about physical activity/exercise needs.;Develop an individualized exercise prescription for aerobic and resistive training based on initial evaluation findings, risk  stratification, comorbidities and participant's personal goals.     Expected Outcomes Short Term: Attend rehab on a regular basis to increase amount of physical activity.;Long Term: Exercising regularly at least 3-5 days a week.;Long Term: Add in home exercise to make exercise part of routine and to increase amount of physical activity. Short Term: Attend rehab on a regular basis to increase amount of physical activity.;Long Term: Exercising regularly at least 3-5 days a week.;Long Term: Add in home exercise to make exercise part of routine and to increase amount of physical activity. Short Term: Attend rehab on a regular basis to increase amount of physical activity.;Long Term: Exercising regularly at least 3-5 days a week.;Long Term: Add in home exercise to make exercise part of routine and to increase amount of physical activity.     Increase Strength and Stamina Yes Yes --     Intervention Provide advice, education, support and counseling about physical activity/exercise needs.;Develop an individualized exercise prescription for aerobic and resistive training based on initial evaluation findings, risk stratification, comorbidities and participant's personal goals. Provide advice, education, support and counseling about physical activity/exercise needs.;Develop an individualized exercise prescription for aerobic and resistive training based on initial evaluation findings, risk stratification, comorbidities and participant's personal goals. Provide advice, education, support and counseling about physical activity/exercise needs.;Develop an individualized exercise prescription for aerobic and resistive training based on initial evaluation findings, risk stratification, comorbidities and participant's personal goals.     Expected Outcomes Short Term: Increase workloads from initial exercise prescription for resistance, speed, and METs.;Short Term: Perform resistance training exercises routinely during rehab and  add in resistance training at home;Long Term: Improve cardiorespiratory fitness, muscular endurance and strength as measured by increased METs and functional capacity ( ) Short Term: Increase workloads from initial exercise prescription for resistance, speed, and METs.;Short Term: Perform resistance training exercises routinely during rehab and add in resistance training at home;Long Term: Improve cardiorespiratory fitness, muscular endurance and strength as measured by increased METs and functional capacity ( ) Short Term: Increase workloads from initial exercise prescription for resistance, speed, and METs.;Short Term: Perform resistance training exercises routinely during rehab and add in resistance training at home;Long Term: Improve cardiorespiratory fitness, muscular endurance  and strength as measured by increased METs and functional capacity ( )     Able to understand and use rate of perceived exertion (RPE) scale Yes Yes Yes     Intervention Provide education and explanation on how to use RPE scale Provide education and explanation on how to use RPE scale Provide education and explanation on how to use RPE scale     Expected Outcomes Short Term: Able to use RPE daily in rehab to express subjective intensity level;Long Term:  Able to use RPE to guide intensity level when exercising independently Short Term: Able to use RPE daily in rehab to express subjective intensity level;Long Term:  Able to use RPE to guide intensity level when exercising independently Short Term: Able to use RPE daily in rehab to express subjective intensity level;Long Term:  Able to use RPE to guide intensity level when exercising independently     Able to understand and use Dyspnea scale Yes Yes Yes     Intervention Provide education and explanation on how to use Dyspnea scale Provide education and explanation on how to use Dyspnea scale Provide education and explanation on how to use Dyspnea scale     Expected Outcomes  Short Term: Able to use Dyspnea scale daily in rehab to express subjective sense of shortness of breath during exertion;Long Term: Able to use Dyspnea scale to guide intensity level when exercising independently Short Term: Able to use Dyspnea scale daily in rehab to express subjective sense of shortness of breath during exertion;Long Term: Able to use Dyspnea scale to guide intensity level when exercising independently Short Term: Able to use Dyspnea scale daily in rehab to express subjective sense of shortness of breath during exertion;Long Term: Able to use Dyspnea scale to guide intensity level when exercising independently     Knowledge and understanding of Target Heart Rate Range (THRR) Yes Yes Yes     Intervention Provide education and explanation of THRR including how the numbers were predicted and where they are located for reference Provide education and explanation of THRR including how the numbers were predicted and where they are located for reference Provide education and explanation of THRR including how the numbers were predicted and where they are located for reference     Expected Outcomes Short Term: Able to state/look up THRR;Long Term: Able to use THRR to govern intensity when exercising independently;Short Term: Able to use daily as guideline for intensity in rehab Short Term: Able to state/look up THRR;Long Term: Able to use THRR to govern intensity when exercising independently;Short Term: Able to use daily as guideline for intensity in rehab Short Term: Able to state/look up THRR;Long Term: Able to use THRR to govern intensity when exercising independently;Short Term: Able to use daily as guideline for intensity in rehab     Understanding of Exercise Prescription Yes Yes Yes     Intervention Provide education, explanation, and written materials on patient's individual exercise prescription Provide education, explanation, and written materials on patient's individual exercise  prescription Provide education, explanation, and written materials on patient's individual exercise prescription     Expected Outcomes Short Term: Able to explain program exercise prescription;Long Term: Able to explain home exercise prescription to exercise independently Short Term: Able to explain program exercise prescription;Long Term: Able to explain home exercise prescription to exercise independently Short Term: Able to explain program exercise prescription;Long Term: Able to explain home exercise prescription to exercise independently  Exercise Goals Re-Evaluation:  Exercise Goals Re-Evaluation     Row Name 04/15/21 1350 05/08/21 1138           Exercise Goal Re-Evaluation   Exercise Goals Review Increase Physical Activity;Able to understand and use Dyspnea scale;Understanding of Exercise Prescription;Increase Strength and Stamina;Knowledge and understanding of Target Heart Rate Range (THRR);Able to understand and use rate of perceived exertion (RPE) scale Increase Physical Activity;Increase Strength and Stamina;Able to understand and use rate of perceived exertion (RPE) scale;Knowledge and understanding of Target Heart Rate Range (THRR);Able to check pulse independently;Understanding of Exercise Prescription      Comments Pt has attended 7 exercise sessions of pulmonary rehab. She has been progressing well and slowly progressing her workloads. She does report frustration with not being able to lose weight. She reportst that her strength and stamina are beginning to improve. She is currently exercising at 2.23 METs. Will continue to monitor and progress as able. Pt has completed 9 sesisons of pulmonary rehab. She has been progressing slowly with her workloads. She tends to miss class due to other medical issues that flar up and cause her to feel unwell. She is currently exercising at 2.3 METs on the TM. Will continue to monitor and progress as able.      Expected Outcomes  Through exercise at rehab and at home, the patient will meet their stated goals. Through exercise at rehab and at home, the patient will meet their stated goals.               Nutrition & Weight - Outcomes:  Pre Biometrics - 03/20/21 1110       Pre Biometrics   Height  (1.6 m)    Weight 173 lb 15.1 oz (78.9 kg)    Waist Circumference 41 inches    Hip Circumference 45.5 inches    Waist to Hip Ratio 0.9 %    BMI (Calculated) 30.82    Triceps Skinfold 35 mm    % Body Fat 43.9 %    Grip Strength 21.2 kg    Flexibility 0 in    Single Leg Stand 22.64 seconds              Nutrition:  Nutrition Therapy & Goals - 05/07/21 1332       Personal Nutrition Goals   Comments Nutrition handout given to patient on how to choose heart healthy meals.   We provide 2 educational sessions on heart healthy nutruition and assistance with registered dietitian  referal if patient is interested.  She verbally understands and no questions at this time.      Intervention Plan   Intervention Nutrition handout(s) given to patient.    Expected Outcomes Short Term Goal: Understand basic principles of dietary content, such as calories, fat, sodium, cholesterol and nutrients.             Nutrition Discharge:  Nutrition Assessments - 03/20/21 0858       MEDFICTS Scores   Pre Score 0             Education Questionnaire Score:  Knowledge Questionnaire Score - 03/20/21 0905       Knowledge Questionnaire Score   Pre Score 15/18             Pt discharged from [facility] after 10 sessions. 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 She states now is not a good time. We  encouraged her to seek a new referral in the future when she is ready.

## 2021-05-26 DIAGNOSIS — L820 Inflamed seborrheic keratosis: Secondary | ICD-10-CM | POA: Diagnosis not present

## 2021-05-27 ENCOUNTER — Encounter (HOSPITAL_COMMUNITY): Payer: Medicare Other

## 2021-05-29 ENCOUNTER — Encounter (HOSPITAL_COMMUNITY): Payer: Medicare Other

## 2021-06-03 ENCOUNTER — Encounter (HOSPITAL_COMMUNITY): Payer: Medicare Other

## 2021-06-05 ENCOUNTER — Encounter (HOSPITAL_COMMUNITY): Payer: Medicare Other

## 2021-06-10 ENCOUNTER — Encounter (HOSPITAL_COMMUNITY): Payer: Medicare Other

## 2021-06-10 ENCOUNTER — Ambulatory Visit: Payer: Medicare Other | Admitting: Pulmonary Disease

## 2021-06-11 ENCOUNTER — Other Ambulatory Visit: Payer: Medicare Other

## 2021-06-11 ENCOUNTER — Ambulatory Visit: Payer: Medicare Other | Admitting: Women's Health

## 2021-06-12 ENCOUNTER — Encounter (HOSPITAL_COMMUNITY): Payer: Medicare Other

## 2021-06-13 ENCOUNTER — Other Ambulatory Visit (HOSPITAL_COMMUNITY): Payer: Self-pay

## 2021-06-13 ENCOUNTER — Encounter: Payer: Self-pay | Admitting: Pulmonary Disease

## 2021-06-13 ENCOUNTER — Ambulatory Visit: Payer: Medicare Other | Admitting: Pulmonary Disease

## 2021-06-13 ENCOUNTER — Telehealth: Payer: Self-pay | Admitting: Pulmonary Disease

## 2021-06-13 VITALS — BP 128/68 | HR 72 | Temp 98.8°F | Ht 63.0 in | Wt 171.8 lb

## 2021-06-13 DIAGNOSIS — J849 Interstitial pulmonary disease, unspecified: Secondary | ICD-10-CM | POA: Diagnosis not present

## 2021-06-13 DIAGNOSIS — U099 Post covid-19 condition, unspecified: Secondary | ICD-10-CM

## 2021-06-13 DIAGNOSIS — J4550 Severe persistent asthma, uncomplicated: Secondary | ICD-10-CM

## 2021-06-13 DIAGNOSIS — Z8616 Personal history of COVID-19: Secondary | ICD-10-CM

## 2021-06-13 DIAGNOSIS — R0609 Other forms of dyspnea: Secondary | ICD-10-CM

## 2021-06-13 NOTE — Telephone Encounter (Signed)
No additional boosters at this time.  I suspect the CDC will announce plan for booster again this Fall.  She should check back then. ?

## 2021-06-13 NOTE — Telephone Encounter (Signed)
Pt meant to ask if she needed to get another booster due to her history of covid.  Last covid diagnosis was in Nov and has both vaccines and both boosters.  Please advise.  ? ? ?Dr. Halford Chessman please advise  ?

## 2021-06-13 NOTE — Telephone Encounter (Signed)
ATC patient. Left detailed message on cell phone vm (ok per dpr).  ?Advised patient to call back for further questions and also let her know I would send a mychart message with info from [place].  ?Nothing further needed. Mychart message sent.  ?

## 2021-06-13 NOTE — Patient Instructions (Signed)
Follow up in 6 months 

## 2021-06-13 NOTE — Progress Notes (Signed)
? ?[facility] ? ?Chief Complaint  ?Patient presents with  ? Follow-up  ?  Feeling about the same since last ov.   ? ? ?Constitutional:  ?BP 134/82 (BP Location: Left Arm, Patient Position: Sitting)   Pulse 72   Temp 98.8 ?F (37.1 ?C) (Temporal)   Ht 5\' 3"  (1.6 m)   Wt 171 lb 12.8 oz (77.9 kg)   SpO2 98% Comment: ra  BMI 30.43 kg/m?  ? ?Past Medical History:  ?Anxiety, Celiac disease, Fibromyalgia, Grave's disease, HLD, Migraine headaches, HTN, IBS, Lupus, Osteoporosis, Raynaud disease, Sjogren's disease, COVID 19 August/September/October 2022 ? ?Past Surgical History:  ?She  has a past surgical history that includes Reduction mammaplasty; Cholecystectomy; Appendectomy; OTHER SURGICAL HISTORY; and Colonoscopy with propofol (N/A, 06/10/2020). ? ?Brief Summary:  ?Katie Davis is a 66 y.o. female with asthma and history of COVID. ?  ? ? ? ?Subjective:  ? ?She continues to improve.  She feels that pulmonary rehab and yoga breathing have helped.  Not needing albuterol much.  Feels like she can get a fuller breath.  Not having as much congestion in her chest. ? ?She had trouble with her thyroid in February.  Meds adjusted and slowly improving. ? ?Physical Exam:  ? ?Appearance - well kempt  ? ?ENMT - no sinus tenderness, no oral exudate, no LAN, Mallampati 3 airway, no stridor ? ?Respiratory - equal breath sounds bilaterally, no wheezing or rales ? ?CV - s1s2 regular rate and rhythm, no murmurs ? ?Ext - no clubbing, no edema ? ?Skin - no rashes ? ?Psych - normal mood and affect ?  ?Pulmonary testing:  ?PFT 02/27/21 >> FEV1 1.95 (82%), FEV1% 83, TLC 4.75 (96%), DLCO 68% ? ?Chest Imaging:  ?HRCT chest 03/05/21 >> enlarged PA, atherosclerosis, basilar and peripheral GGO with bronchiectasis, calcified granuloma LLL, air trapping ? ?Cardiac testing:  ?Echo 03/07/21 >> EF 55 to 60%, mild LVH ? ?Social History:  ?She  reports that she has never smoked. She has never used smokeless  tobacco. She reports that she does not currently use alcohol. She reports that she does not use drugs. ? ?Family History:  ?Her family history includes Aneurysm in her brother, father, and sister; Celiac disease in her brother, brother, and sister; Congestive Heart Failure in her mother; Irritable bowel syndrome in her sister; Melanoma in her brother; Other in her sister; Stroke in her sister. ?  ? ? ?Assessment/Plan:  ? ?Severe, persistent asthma. ?- continue breo 100 one puff daily ?- prn albuterol ? ?Interstitial lung disease. ?- changes consistent with prior COVID 19 infections ?- explained this can take up to a year from her last COVID infection in November 2022 to get a sense of what her new baseline will be ?- continue pulmonary rehab ?- discussed yoga breathing exercises ?- she is making good progress with recover ? ?Grave's disease. ?- she is followed by Dr. Buddy Duty with Endocrinology ? ?Time Spent Involved in Patient Care on Day of Examination:  ?27 minutes ? ?Follow up:  ? ?Patient Instructions  ?Follow up in 6 months ? ?Medication List:  ? ?Allergies as of 06/13/2021   ? ?   Reactions  ? Prednisone Palpitations  ? Azithromycin   ? Other reaction(s): vomiting  ? Bactrim [sulfamethoxazole-trimethoprim] Nausea And Vomiting  ? Codeine   ? Other reaction(s): vomiting  ? Denosumab   ? Other reaction(s): Unknown  ? Doxycycline   ? Made body feel crazy, increased heart rate  ?  Gluten Meal   ? Ceiliac disease  ? Keflex [cephalexin]   ? Latex Hives  ? Levaquin [levofloxacin] Nausea And Vomiting  ? Sulfa Antibiotics Nausea And Vomiting  ? Contrast Media [iodinated Contrast Media] Nausea And Vomiting, Rash  ? Per patient, did fine with premedication with IV steroids before   ? ?  ? ?  ?Medication List  ?  ? ?  ? Accurate as of June 13, 2021 10:35 AM. If you have any questions, ask your nurse or doctor.  ?  ?  ? ?  ? ?acidophilus Caps capsule ?Take 1 capsule by mouth daily. ?  ?albuterol 108 (90 Base) MCG/ACT  inhaler ?Commonly known as: VENTOLIN HFA ?Inhale 1-2 puffs into the lungs every 6 (six) hours as needed for wheezing or shortness of breath. ?  ?aspirin EC 81 MG tablet ?Take 1 tablet (81 mg total) by mouth daily. Swallow whole. ?  ?atenolol 50 MG tablet ?Commonly known as: TENORMIN ?Take 50 mg by mouth 2 (two) times daily. ?  ?calcium carbonate 500 MG chewable tablet ?Commonly known as: TUMS - dosed in mg elemental calcium ?Chew 1 tablet by mouth daily. ?  ?clonazePAM 0.5 MG tablet ?Commonly known as: KLONOPIN ?Take 0.25-0.5 mg by mouth 2 (two) times daily as needed for anxiety. ?  ?cyclobenzaprine 10 MG tablet ?Commonly known as: FLEXERIL ?Take 10 mg by mouth 3 (three) times daily as needed for muscle spasms. ?  ?ELDERBERRY PO ?Take 1 capsule by mouth daily. ?  ?Fish Oil 1000 MG Caps ?Take 2,000 mg by mouth daily. ?  ?fluticasone furoate-vilanterol 100-25 MCG/ACT Aepb ?Commonly known as: BREO ELLIPTA ?Inhale 1 puff into the lungs daily. ?  ?ibuprofen 800 MG tablet ?Commonly known as: ADVIL ?Take 800 mg by mouth every 8 (eight) hours as needed for moderate pain. ?  ?linaclotide 145 MCG Caps capsule ?Commonly known as: Linzess ?Take 1 capsule (145 mcg total) by mouth daily before breakfast. ?  ?meclizine 12.5 MG tablet ?Commonly known as: ANTIVERT ?Take 12.5-25 mg by mouth every 6 (six) hours as needed for dizziness. ?  ?ondansetron 4 MG tablet ?Commonly known as: Zofran ?Take 1 tablet (4 mg total) by mouth every 8 (eight) hours as needed for nausea or vomiting. ?  ?pantoprazole 40 MG tablet ?Commonly known as: PROTONIX ?Take 1 tablet (40 mg total) by mouth daily before breakfast. ?  ?Restasis 0.05 % ophthalmic emulsion ?Generic drug: cycloSPORINE ?Place 1 drop into both eyes 2 (two) times daily. ?  ?rosuvastatin 5 MG tablet ?Commonly known as: CRESTOR ?Take 5 mg by mouth 4 (four) times a week. ?  ?Synthroid 112 MCG tablet ?Generic drug: levothyroxine ?Take 112 mcg by mouth. Takes 5 days a week. ?  ?Vitamin D3  Gummies 25 MCG (1000 UT) Chew ?Generic drug: Cholecalciferol ?Chew 6,000 Units by mouth daily. ?  ? ?  ? ? ?Signature:  ?Chesley Mires, MD ?[place] ?Pager - [phone] ?06/13/2021, 10:35 AM ?  ? ? ? ? ? ? ? ? ?

## 2021-06-17 ENCOUNTER — Encounter (HOSPITAL_COMMUNITY): Payer: Medicare Other

## 2021-06-19 ENCOUNTER — Encounter (HOSPITAL_COMMUNITY): Payer: Medicare Other

## 2021-06-19 DIAGNOSIS — Z136 Encounter for screening for cardiovascular disorders: Secondary | ICD-10-CM | POA: Diagnosis not present

## 2021-06-19 DIAGNOSIS — I10 Essential (primary) hypertension: Secondary | ICD-10-CM | POA: Diagnosis not present

## 2021-06-19 DIAGNOSIS — E039 Hypothyroidism, unspecified: Secondary | ICD-10-CM | POA: Diagnosis not present

## 2021-06-23 DIAGNOSIS — Z0001 Encounter for general adult medical examination with abnormal findings: Secondary | ICD-10-CM | POA: Diagnosis not present

## 2021-06-23 DIAGNOSIS — I10 Essential (primary) hypertension: Secondary | ICD-10-CM | POA: Diagnosis not present

## 2021-06-23 DIAGNOSIS — K900 Celiac disease: Secondary | ICD-10-CM | POA: Diagnosis not present

## 2021-06-23 DIAGNOSIS — K581 Irritable bowel syndrome with constipation: Secondary | ICD-10-CM | POA: Diagnosis not present

## 2021-06-24 ENCOUNTER — Encounter (HOSPITAL_COMMUNITY): Payer: Medicare Other

## 2021-06-26 ENCOUNTER — Encounter (HOSPITAL_COMMUNITY): Payer: Medicare Other

## 2021-07-01 ENCOUNTER — Encounter (HOSPITAL_COMMUNITY): Payer: Medicare Other

## 2021-07-02 DIAGNOSIS — L820 Inflamed seborrheic keratosis: Secondary | ICD-10-CM | POA: Diagnosis not present

## 2021-07-02 DIAGNOSIS — B078 Other viral warts: Secondary | ICD-10-CM | POA: Diagnosis not present

## 2021-07-02 DIAGNOSIS — D225 Melanocytic nevi of trunk: Secondary | ICD-10-CM | POA: Diagnosis not present

## 2021-07-02 DIAGNOSIS — Z1283 Encounter for screening for malignant neoplasm of skin: Secondary | ICD-10-CM | POA: Diagnosis not present

## 2021-07-03 ENCOUNTER — Encounter (HOSPITAL_COMMUNITY): Payer: Medicare Other

## 2021-07-06 ENCOUNTER — Encounter: Payer: Self-pay | Admitting: Women's Health

## 2021-07-08 ENCOUNTER — Encounter (HOSPITAL_COMMUNITY): Payer: Medicare Other

## 2021-07-09 ENCOUNTER — Other Ambulatory Visit: Payer: Medicare Other

## 2021-07-09 ENCOUNTER — Ambulatory Visit: Payer: Medicare Other | Admitting: Women's Health

## 2021-07-10 ENCOUNTER — Encounter (HOSPITAL_COMMUNITY): Payer: Medicare Other

## 2021-07-11 ENCOUNTER — Telehealth: Payer: Self-pay

## 2021-07-11 NOTE — Telephone Encounter (Signed)
I am okay with this.  She should practice hand hygiene and social distancing as best she can when at the gym. ?

## 2021-07-11 NOTE — Telephone Encounter (Signed)
Patient called and states she had called to retry pulmonary rehab. She states that she cannot afford pulmonary rehab anymore since it is twice a week and a copay for every visit. She wants to know if Dr. Halford Chessman would be okay with her going to the gym  and trying to exercise on her own instead of doing pulmonary rehab ? ?Dr. Halford Chessman please advise.  ?

## 2021-07-13 IMAGING — DX DG LUMBAR SPINE COMPLETE 4+V
5 series · 5 of 5 positions shown · non-contrast
Comparison: CT 07/26/2019

CLINICAL DATA: Fall out of chair with low back pain.

EXAM:
LUMBAR SPINE - COMPLETE 4+ VIEW

[l-spine ap]
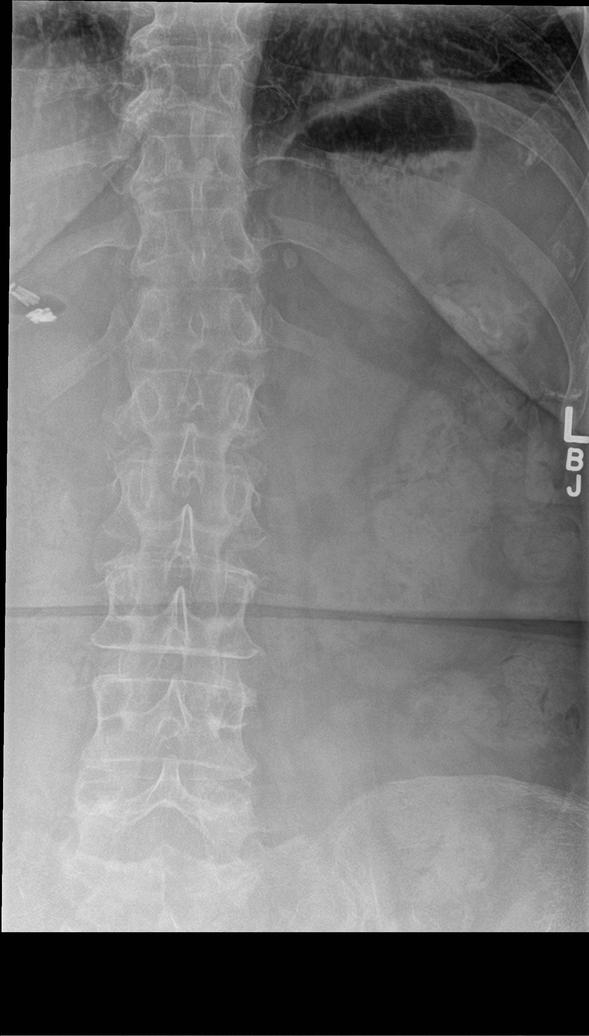

[l-spine obl (1 of 2)]
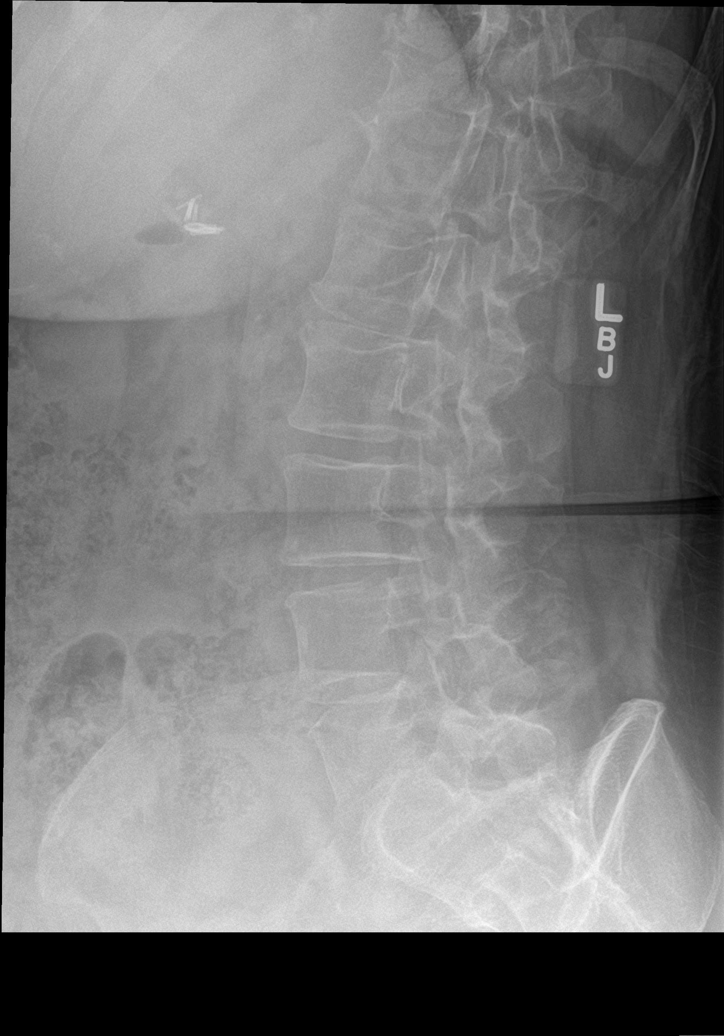

[l-spine obl (2 of 2)]
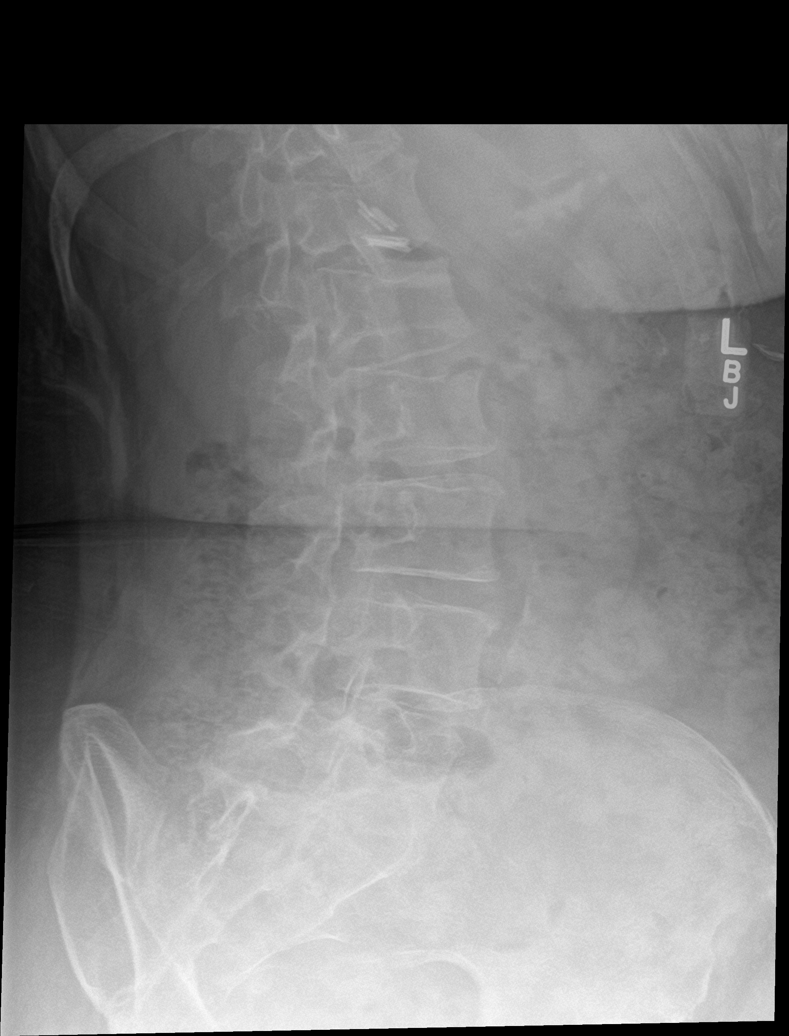

[l-spine lat]
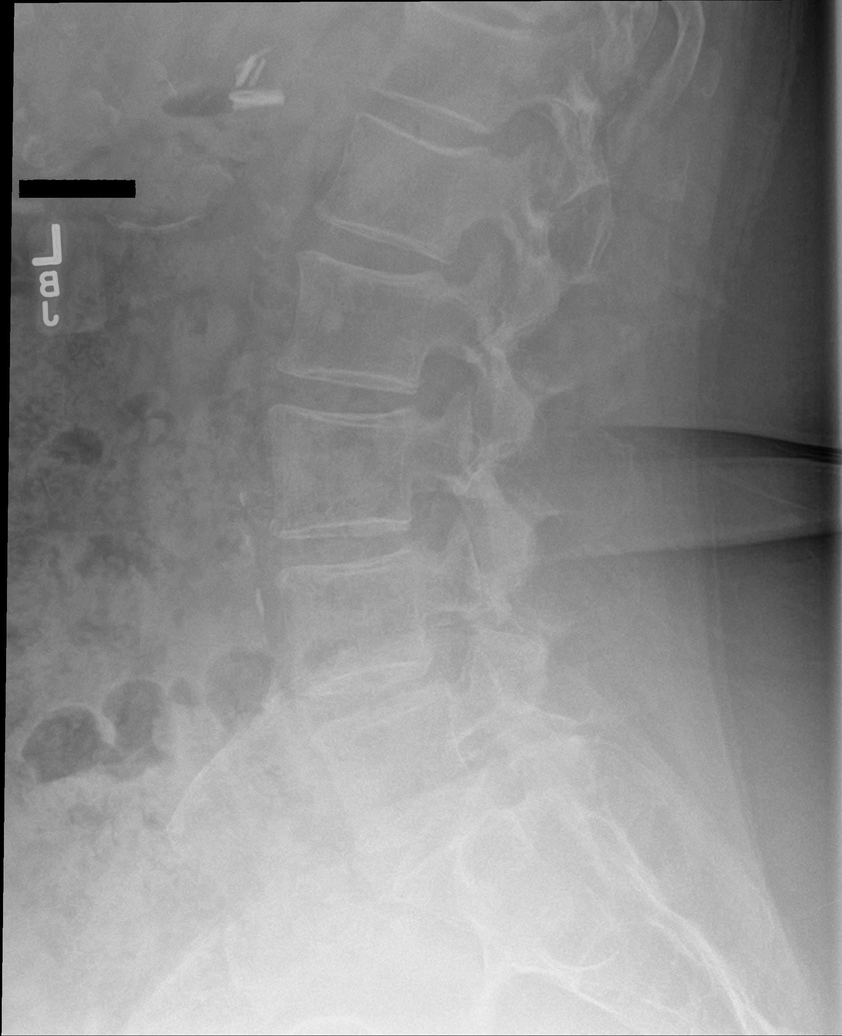

[l-spine spot]
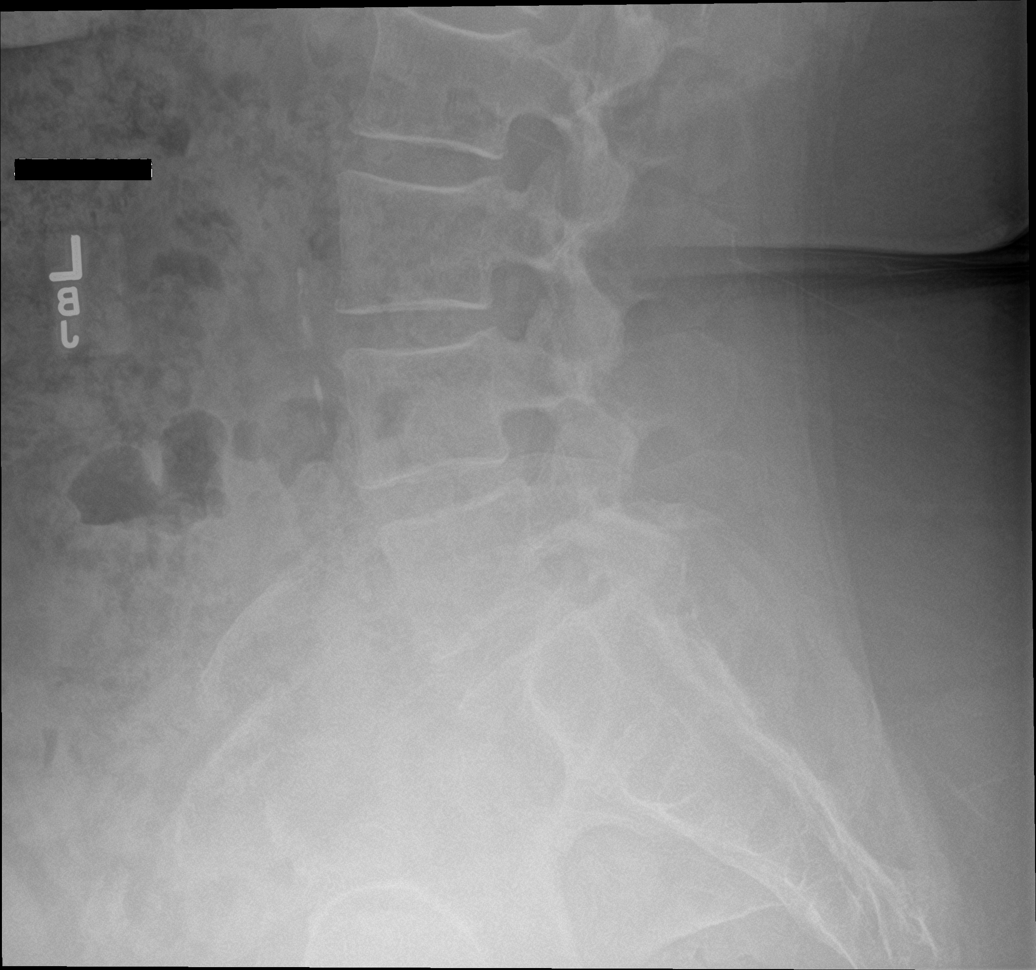

[5 of 5 positions shown; findings below may reference images not displayed]

FINDINGS: Vertebral body alignment and heights are normal. There is mild
spondylosis of the lumbar spine to include facet arthropathy. No
evidence of compression fracture or spondylolisthesis/spondylolysis.
IMPRESSION: 1. No acute findings.
2. Mild spondylosis of the lumbar spine.

## 2021-07-14 NOTE — Telephone Encounter (Signed)
ATC patient to notify of response. Left detailed VM (ok per dpr) on patients cell phone number.   ?Nothing further needed ?

## 2021-07-15 ENCOUNTER — Encounter (HOSPITAL_COMMUNITY): Payer: Medicare Other

## 2021-07-16 NOTE — Telephone Encounter (Signed)
Patient called and said the gym she tried to go to does not social distance so she is going to try at home exercises.   ?

## 2021-07-17 ENCOUNTER — Encounter (HOSPITAL_COMMUNITY): Payer: Medicare Other

## 2021-07-17 DIAGNOSIS — E039 Hypothyroidism, unspecified: Secondary | ICD-10-CM | POA: Diagnosis not present

## 2021-07-22 ENCOUNTER — Encounter (HOSPITAL_COMMUNITY): Payer: Medicare Other

## 2021-07-24 ENCOUNTER — Encounter (HOSPITAL_COMMUNITY): Payer: Medicare Other

## 2021-07-24 NOTE — Progress Notes (Deleted)
Referring Provider: Benita Stabile, MD Primary Care Physician:  Leone Payor, FNP Primary GI Physician: Dr. Marletta Lor  No chief complaint on file.   HPI:   Katie Davis is a 66 y.o. female with history of IBS-C, diverticulosis, celiac disease (biopsy confirmed), chronic nausea, 50% blockage of SMA, previously followed prior to moving to [place].  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  Colonoscopy up-to-date March 2022 revealing diverticulosis and nonbleeding internal hemorrhoids, due for repeat in 2032. She is presenting today for follow-up.  Last seen in our office 01/30/2021.  Reported she had COVID 3 times since April, had been on several rounds of antibiotics, and since then, could not get her "gut" right.  Reported all GI symptoms have been flared. Still with mild lingering COVID symptoms of cough and shortness of breath with exertion.  She was having 2 mushy bowel movements taking 1-2 Linzess 72 mcg daily.  Occasionally skipping a day between bowel movements, and overall still felt "backed up".  Felt bloated.  Left and right sided abdominal pain.  Right-sided pain improved with a bowel movement, but left-sided pain did not.  Felt she may have diverticulitis as her left sided pain was constant for the last couple of weeks.  Ongoing chronic nausea without vomiting.  Nausea worsened by constipation.  Noted early satiety since COVID, but denied weight loss.  GERD symptoms 2 to 3 days a week when coughing.  Not on a daily PPI.  She has been taking ibuprofen and Aleve when she had COVID, and also took Alka-Seltzer for "6 attacks of gastritis" since COVID.  On exam, she had mild generalized tenderness to palpation of her abdomen.  Overall, suspected symptoms are likely multifactorial in the setting of GERD, gastritis, IBS, unable to exclude PUD, H. pylori, postinfectious gastroparesis, diverticulitis,  or pancreatitis.  Plan to start PPI daily, increase Linzess to 145 mcg daily, check labs, and obtain CT with oral contrast only.  Consider EGD in the future if upper GI symptoms persist.  CT A/P with oral contrast with moderate volume of stool throughout the colon, no acute findings.  Labs completed with PCP on 11/29 with TSH elevated at 21.3, CBC within normal limits, CMP within normal limits, lipase normal.   Patient reported ongoing constipation with Linzess 145 mcg and was asking if she could take something with Linzess.  Recommended adding MiraLAX daily.  She later called back and reported improvement.  Thyroid function improved with TSH 2.930 on 03/19/21.   Today:    Past Medical History:  Diagnosis Date   Abnormal glandular Papanicolaou smear of cervix    Anxiety    Asthma    Body mass index 30.0-30.9, adult    Celiac disease    Chronic vascular disorder of intestine (HCC)    Fibromyalgia    Graves disease    Headache    Migrains   Hyperlipidemia    Hypertension    Hypothyroidism    IBS (irritable bowel syndrome) With constipation    Lupus (systemic lupus erythematosus) (HCC)    Obesity, unspecified    Osteoporosis    Raynaud disease    Sjogren's disease (HCC)     Past Surgical History:  Procedure Laterality Date   APPENDECTOMY     CHOLECYSTECTOMY     COLONOSCOPY WITH PROPOFOL N/A 06/10/2020   Surgeon: Lanelle Bal, DO;  diverticulosis and nonbleeding internal hemorrhoids,  due for repeat in 2032.   OTHER SURGICAL HISTORY     biopsies on both feet   REDUCTION MAMMAPLASTY      Current Outpatient Medications  Medication Sig Dispense Refill   acidophilus (RISAQUAD) CAPS capsule Take 1 capsule by mouth daily.     albuterol (VENTOLIN HFA) 108 (90 Base) MCG/ACT inhaler Inhale 1-2 puffs into the lungs every 6 (six) hours as needed for wheezing or shortness of breath.     aspirin EC 81 MG tablet Take 1 tablet (81 mg total) by mouth daily. Swallow whole. 90 tablet 3    atenolol (TENORMIN) 50 MG tablet Take 50 mg by mouth 2 (two) times daily.      calcium carbonate (TUMS - DOSED IN MG ELEMENTAL CALCIUM) 500 MG chewable tablet Chew 1 tablet by mouth daily.     Cholecalciferol (VITAMIN D3 GUMMIES) 25 MCG (1000 UT) CHEW Chew 6,000 Units by mouth daily.     clonazePAM (KLONOPIN) 0.5 MG tablet Take 0.25-0.5 mg by mouth 2 (two) times daily as needed for anxiety.      cyclobenzaprine (FLEXERIL) 10 MG tablet Take 10 mg by mouth 3 (three) times daily as needed for muscle spasms.      ELDERBERRY PO Take 1 capsule by mouth daily.     fluticasone furoate-vilanterol (BREO ELLIPTA) 100-25 MCG/ACT AEPB Inhale 1 puff into the lungs daily.     ibuprofen (ADVIL,MOTRIN) 800 MG tablet Take 800 mg by mouth every 8 (eight) hours as needed for moderate pain.     linaclotide (LINZESS) 145 MCG CAPS capsule Take 1 capsule (145 mcg total) by mouth daily before breakfast. 30 capsule 3   meclizine (ANTIVERT) 12.5 MG tablet Take 12.5-25 mg by mouth every 6 (six) hours as needed for dizziness.      Omega-3 Fatty Acids (FISH OIL) 1000 MG CAPS Take 2,000 mg by mouth daily.     ondansetron (ZOFRAN) 4 MG tablet Take 1 tablet (4 mg total) by mouth every 8 (eight) hours as needed for nausea or vomiting. 12 tablet 0   pantoprazole (PROTONIX) 40 MG tablet Take 1 tablet (40 mg total) by mouth daily before breakfast. 30 tablet 3   RESTASIS 0.05 % ophthalmic emulsion Place 1 drop into both eyes 2 (two) times daily.     rosuvastatin (CRESTOR) 5 MG tablet Take 5 mg by mouth 4 (four) times a week.     SYNTHROID 112 MCG tablet Take 112 mcg by mouth. Takes 5 days a week.     No current facility-administered medications for this visit.    Allergies as of 07/25/2021 - Review Complete 06/13/2021  Allergen Reaction Noted   Prednisone Palpitations 12/17/2020   Azithromycin  02/21/2021   Bactrim [sulfamethoxazole-trimethoprim] Nausea And Vomiting 11/16/2018   Codeine  02/21/2021   Denosumab  02/21/2021    Doxycycline  05/07/2020   Gluten meal  11/16/2018   Keflex [cephalexin]  12/09/2018   Latex Hives 11/16/2018   Levaquin [levofloxacin] Nausea And Vomiting 11/16/2018   Sulfa antibiotics Nausea And Vomiting 11/16/2018   Contrast media [iodinated contrast media] Nausea And Vomiting and Rash 11/16/2018    Family History  Problem Relation Age of Onset   Congestive Heart Failure Mother    Aneurysm Father    Celiac disease Brother    Melanoma Brother    Aneurysm Brother        stomach   Stroke Sister    Celiac disease Sister    Irritable bowel syndrome Sister  Celiac disease Brother    Aneurysm Sister    Other Sister        multiple back surgeries   Colon cancer Neg Hx     Social History   Socioeconomic History   Marital status: Married    Spouse name: Not on file   Number of children: Not on file   Years of education: Not on file   Highest education level: Not on file  Occupational History   Not on file  Tobacco Use   Smoking status: Never   Smokeless tobacco: Never  Vaping Use   Vaping Use: Never used  Substance and Sexual Activity   Alcohol use: Not Currently    Comment: wine occ   Drug use: Never   Sexual activity: Not Currently    Birth control/protection: Post-menopausal  Other Topics Concern   Not on file  Social History Narrative   Not on file   Social Determinants of Health   Financial Resource Strain: Low Risk    Difficulty of Paying Living Expenses: Not hard at all  Food Insecurity: No Food Insecurity   Worried About Programme researcher, broadcasting/film/video in the Last Year: Never true   Ran Out of Food in the Last Year: Never true  Transportation Needs: No Transportation Needs   Lack of Transportation (Medical): No   Lack of Transportation (Non-Medical): No  Physical Activity: Insufficiently Active   Days of Exercise per Week: 2 days   Minutes of Exercise per Session: 50 min  Stress: Stress Concern Present   Feeling of Stress : To some extent  Social  Connections: Moderately Integrated   Frequency of Communication with Friends and Family: More than three times a week   Frequency of Social Gatherings with Friends and Family: Once a week   Attends Religious Services: More than 4 times per year   Active Member of [employer] or Organizations: No   Attends Banker Meetings: Never   Marital Status: Married    Review of Systems: Gen: Denies fever, chills, cold or flulike symptoms, presyncope, syncope. CV: Denies chest pain or heart palpitations. Resp: Denies dyspnea or cough. GI: See HPI Heme: See HPI  Physical Exam: There were no vitals taken for this visit. General:   Alert and oriented. No distress noted. Pleasant and cooperative.  Head:  Normocephalic and atraumatic. Eyes:  Conjuctiva clear without scleral icterus. Heart:  S1, S2 present without murmurs appreciated. Lungs:  Clear to auscultation bilaterally. No wheezes, rales, or rhonchi. No distress.  Abdomen:  +BS, soft, non-tender and non-distended. No rebound or guarding. No HSM or masses noted. Msk:  Symmetrical without gross deformities. Normal posture. Extremities:  Without edema. Neurologic:  Alert and  oriented x4 Psych:  Normal mood and affect.    Assessment:     Plan:  ***   Ermalinda Memos, PA-C [facility] 07/25/2021

## 2021-07-25 ENCOUNTER — Ambulatory Visit: Payer: Medicare Other | Admitting: Gastroenterology

## 2021-07-25 DIAGNOSIS — J0100 Acute maxillary sinusitis, unspecified: Secondary | ICD-10-CM | POA: Diagnosis not present

## 2021-07-28 ENCOUNTER — Other Ambulatory Visit: Payer: Self-pay | Admitting: Pulmonary Disease

## 2021-08-05 ENCOUNTER — Ambulatory Visit: Payer: Medicare Other | Admitting: Women's Health

## 2021-08-05 ENCOUNTER — Other Ambulatory Visit: Payer: Medicare Other

## 2021-08-26 DIAGNOSIS — E039 Hypothyroidism, unspecified: Secondary | ICD-10-CM | POA: Diagnosis not present

## 2021-08-27 ENCOUNTER — Other Ambulatory Visit: Payer: Medicare Other

## 2021-09-01 ENCOUNTER — Ambulatory Visit: Payer: Medicare Other | Admitting: Women's Health

## 2021-10-02 DIAGNOSIS — E039 Hypothyroidism, unspecified: Secondary | ICD-10-CM | POA: Diagnosis not present

## 2021-10-04 ENCOUNTER — Ambulatory Visit
Admission: EM | Admit: 2021-10-04 | Discharge: 2021-10-04 | Disposition: A | Discharge status: Home / Self Care | Payer: Medicare Other | Attending: Nurse Practitioner | Admitting: Nurse Practitioner

## 2021-10-04 ENCOUNTER — Encounter: Payer: Self-pay | Admitting: Emergency Medicine

## 2021-10-04 DIAGNOSIS — K529 Noninfective gastroenteritis and colitis, unspecified: Secondary | ICD-10-CM

## 2021-10-04 MED ORDER — DIPHENOXYLATE-ATROPINE 2.5-0.025 MG PO TABS
1.0000 | ORAL_TABLET | Freq: Four times a day (QID) | ORAL | 0 refills | Status: AC | PRN
Start: 2021-10-04 — End: 2021-10-09

## 2021-10-04 MED ORDER — PROMETHAZINE HCL 12.5 MG PO TABS: 12.5000 mg | ORAL_TABLET | Freq: Four times a day (QID) | ORAL | 0 refills | Status: DC | PRN

## 2021-10-04 NOTE — Discharge Instructions (Signed)
Take medication as prescribed. Also recommend purchasing over-the-counter Gas-X to help with your gas and bloating. Recommend a brat diet until your symptoms improve, this includes bananas, rice, applesauce, and toast.  Also recommend eating yogurt, Jell-O, soups, broths, Pedialyte, popsicles, and things to keep you well-hydrated. May take over-the-counter Tylenol for pain or discomfort. Go to the emergency department immediately if you develop new fever, chills, inability to keep any foods or liquids down, or other concerns. Follow-up in this clinic as needed.

## 2021-10-04 NOTE — ED Provider Notes (Signed)
[facility]    CSN: [id number] Arrival date & time: 10/04/21  0846      History   Chief Complaint No chief complaint on file.   HPI Kourtlyn Charlet is a 66 y.o. female.   The history is provided by the patient.   Patient presents for complaints of diarrhea and nausea that been present for the past 3 days.  Patient states symptoms started after she and her husband both drink a bottle from a case of [employer].  Since that time, she complains of chills, diarrhea, nausea, and abdominal cramping.  She denies fever, chest pain, shortness of breath, vomiting, or constipation.  She also denies the intake of new foods or medications.  Patient states she has had more than 5 diarrhea stools since her symptoms started.  She states those are less liquid and becoming more formed.  She also complains of gas and bloating.  She states that she is taking Zofran for her symptoms with minimal relief.  Past Medical History:  Diagnosis Date   Abnormal glandular Papanicolaou smear of cervix    Anxiety    Asthma    Body mass index 30.0-30.9, adult    Celiac disease    Chronic vascular disorder of intestine (HCC)    Fibromyalgia    Graves disease    Headache    Migrains   Hyperlipidemia    Hypertension    Hypothyroidism    IBS (irritable bowel syndrome) With constipation    Lupus (systemic lupus erythematosus) (HCC)    Obesity, unspecified    Osteoporosis    Raynaud disease    Sjogren's disease (HCC)     Patient Active Problem List   Diagnosis Date Noted   Gastroesophageal reflux disease 01/30/2021   Early satiety 01/30/2021   Bloating 01/30/2021   Celiac disease    Chronic vascular disorder of intestine (HCC)    DOE (dyspnea on exertion) 12/05/2020   Alternating constipation and diarrhea 05/07/2020   Preventative health care 05/07/2020   Nausea without vomiting 05/07/2020   Generalized abdominal pain 10/24/2019   Postmenopausal 12/14/2018   Vasomotor symptoms due to  menopause 12/14/2018   History of abnormal cervical Pap smear 12/14/2018    Past Surgical History:  Procedure Laterality Date   APPENDECTOMY     CHOLECYSTECTOMY     COLONOSCOPY WITH PROPOFOL N/A 06/10/2020   Surgeon: Lanelle Bal, DO;  diverticulosis and nonbleeding internal hemorrhoids, due for repeat in 2032.   OTHER SURGICAL HISTORY     biopsies on both feet   REDUCTION MAMMAPLASTY      OB History     Gravida  3   Para      Term      Preterm      AB  3   Living  0      SAB  3   IAB      Ectopic      Multiple      Live Births               Home Medications    Prior to Admission medications   Medication Sig Start Date End Date Taking? Authorizing Provider  acidophilus (RISAQUAD) CAPS capsule Take 1 capsule by mouth daily.    [provider]  albuterol (VENTOLIN HFA) 108 (90 Base) MCG/ACT inhaler Inhale 1-2 puffs into the lungs every 6 (six) hours as needed for wheezing or shortness of breath.    [provider]  aspirin EC 81  MG tablet Take 1 tablet (81 mg total) by mouth daily. Swallow whole. 04/04/21   Arnoldo Lenis, MD  atenolol (TENORMIN) 50 MG tablet Take 50 mg by mouth 2 (two) times daily.     [provider]  BREO ELLIPTA 100-25 MCG/ACT AEPB Inhale 1 puff by mouth once daily 07/28/21   Chesley Mires, MD  calcium carbonate (TUMS - DOSED IN MG ELEMENTAL CALCIUM) 500 MG chewable tablet Chew 1 tablet by mouth daily.    [provider]  Cholecalciferol (VITAMIN D3 GUMMIES) 25 MCG (1000 UT) CHEW Chew 6,000 Units by mouth daily.    [provider]  clonazePAM (KLONOPIN) 0.5 MG tablet Take 0.25-0.5 mg by mouth 2 (two) times daily as needed for anxiety.     [provider]  cyclobenzaprine (FLEXERIL) 10 MG tablet Take 10 mg by mouth 3 (three) times daily as needed for muscle spasms.  07/07/19   [provider]  ELDERBERRY PO Take 1 capsule by mouth daily.    [provider]   ibuprofen (ADVIL,MOTRIN) 800 MG tablet Take 800 mg by mouth every 8 (eight) hours as needed for moderate pain.    [provider]  linaclotide Rolan Lipa) 145 MCG CAPS capsule Take 1 capsule (145 mcg total) by mouth daily before breakfast. 01/30/21   Erenest Rasher, PA-C  meclizine (ANTIVERT) 12.5 MG tablet Take 12.5-25 mg by mouth every 6 (six) hours as needed for dizziness.     Celene Squibb, MD  Omega-3 Fatty Acids (FISH OIL) 1000 MG CAPS Take 2,000 mg by mouth daily.    [provider]  ondansetron (ZOFRAN) 4 MG tablet Take 1 tablet (4 mg total) by mouth every 8 (eight) hours as needed for nausea or vomiting. 08/25/20   Noemi Chapel, MD  pantoprazole (PROTONIX) 40 MG tablet Take 1 tablet (40 mg total) by mouth daily before breakfast. 01/30/21   Erenest Rasher, PA-C  RESTASIS 0.05 % ophthalmic emulsion Place 1 drop into both eyes 2 (two) times daily. 10/25/19   [provider]  rosuvastatin (CRESTOR) 5 MG tablet Take 5 mg by mouth 4 (four) times a week. 03/05/20   [provider]  SYNTHROID 112 MCG tablet Take 112 mcg by mouth. Takes 5 days a week. 04/19/20   [provider]    Family History Family History  Problem Relation Age of Onset   Congestive Heart Failure Mother    Aneurysm Father    Celiac disease Brother    Melanoma Brother    Aneurysm Brother        stomach   Stroke Sister    Celiac disease Sister    Irritable bowel syndrome Sister    Celiac disease Brother    Aneurysm Sister    Other Sister        multiple back surgeries   Colon cancer Neg Hx     Social History Social History   Tobacco Use   Smoking status: Never   Smokeless tobacco: Never  Vaping Use   Vaping Use: Never used  Substance Use Topics   Alcohol use: Not Currently    Comment: wine occ   Drug use: Never     Allergies   Prednisone, Azithromycin, Bactrim [sulfamethoxazole-trimethoprim], Codeine, Denosumab, Doxycycline, Gluten meal, Keflex  [cephalexin], Latex, Levaquin [levofloxacin], Sulfa antibiotics, and Contrast media [iodinated contrast media]   Review of Systems Review of Systems Per HPI  Physical Exam Triage Vital Signs ED Triage Vitals [10/04/21 0853]  Enc Vitals Group  BP 130/81     Pulse Rate 66     Resp 18     Temp 98 F (36.7 C)     Temp Source Oral     SpO2 97 %     Weight      Height      Head Circumference      Peak Flow      Pain Score 5     Pain Loc      Pain Edu?      Excl. in GC?    No data found.  Updated Vital Signs BP 130/81 (BP Location: Right Arm)   Pulse 66   Temp 98 F (36.7 C) (Oral)   Resp 18   SpO2 97%   Visual Acuity Right Eye Distance:   Left Eye Distance:   Bilateral Distance:    Right Eye Near:   Left Eye Near:    Bilateral Near:     Physical Exam Vitals reviewed.  Constitutional:      General: She is not in acute distress.    Appearance: She is well-developed.  HENT:     Head: Normocephalic.     Nose: Nose normal.     Mouth/Throat:     Mouth: Mucous membranes are moist.  Eyes:     Extraocular Movements: Extraocular movements intact.     Pupils: Pupils are equal, round, and reactive to light.  Cardiovascular:     Rate and Rhythm: Normal rate and regular rhythm.     Heart sounds: Normal heart sounds.  Pulmonary:     Effort: Pulmonary effort is normal.     Breath sounds: Normal breath sounds.  Abdominal:     General: Bowel sounds are normal. There is no distension.     Palpations: Abdomen is soft.     Tenderness: There is generalized abdominal tenderness. There is no guarding or rebound.  Genitourinary:    Vagina: Normal. No vaginal discharge.  Musculoskeletal:     Cervical back: Normal range of motion.  Skin:    General: Skin is warm and dry.     Findings: No erythema or rash.  Neurological:     General: No focal deficit present.     Mental Status: She is alert and oriented to person, place, and time.     Cranial Nerves: No cranial nerve  deficit.  Psychiatric:        Mood and Affect: Mood normal.        Behavior: Behavior normal.      UC Treatments / Results  Labs (all labs ordered are listed, but only abnormal results are displayed) Labs Reviewed - No data to display  EKG   Radiology No results found.  Procedures Procedures (including critical care time)  Medications Ordered in UC Medications - No data to display  Initial Impression / Assessment and Plan / UC Course  I have reviewed the triage vital signs and the nursing notes.  Pertinent labs & imaging results that were available during my care of the patient were reviewed by me and considered in my medical decision making (see chart for details).  Patient presents for complaints of diarrhea and nausea that been present for the past 3 days.  On exam, she has diffuse tenderness to her abdomen.  Vital signs are stable, she is in no acute distress.  There is no concern for acute abdomen at this time.  We will start patient on Lomotil, and will also give her Gas-X and Phenergan for  her symptoms.  Supportive care recommendations were provided to the patient patient was given strict indications of when to follow-up in this clinic versus when to go to the emergency department. Final Clinical Impressions(s) / UC Diagnoses   Final diagnoses:  None   Discharge Instructions   None    ED Prescriptions   None    PDMP not reviewed this encounter.   Abran Cantor, NP 10/04/21 1001

## 2021-10-04 NOTE — ED Triage Notes (Signed)
Diarrhea and nausea since Wednesday.  States she feel tired.

## 2021-10-08 DIAGNOSIS — R197 Diarrhea, unspecified: Secondary | ICD-10-CM | POA: Diagnosis not present

## 2021-10-08 DIAGNOSIS — R140 Abdominal distension (gaseous): Secondary | ICD-10-CM | POA: Diagnosis not present

## 2021-10-08 DIAGNOSIS — R5383 Other fatigue: Secondary | ICD-10-CM | POA: Diagnosis not present

## 2021-10-08 DIAGNOSIS — R509 Fever, unspecified: Secondary | ICD-10-CM | POA: Diagnosis not present

## 2021-10-12 NOTE — Progress Notes (Unsigned)
Referring Provider: Leone Payor, FNP Primary Care Physician:  Leone Payor, FNP Primary GI Physician: Dr. Marletta Lor  Chief Complaint  Patient presents with   Follow-up    HPI:   Katie Davis is a 66 y.o. 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  Colonoscopy up-to-date March 2022 revealing diverticulosis and nonbleeding internal hemorrhoids, due for repeat in 2032. She is presenting today for follow-up.  Last seen in our office 01/30/2021.  Reported she had COVID 3 times since April, had been on several rounds of antibiotics, and since then, could not get her "gut" right.  Reported all GI symptoms have been flared. Still with mild lingering COVID symptoms of cough and shortness of breath with exertion.  She was having 2 mushy bowel movements taking 1-2 Linzess 72 mcg daily.  Occasionally skipping a day between bowel movements, and overall still felt "backed up".  Felt bloated.  Left and right sided abdominal pain.  Right-sided pain improved with a bowel movement, but left-sided pain did not.  Felt she may have diverticulitis as her left sided pain was constant for the last couple of weeks.  Ongoing chronic nausea without vomiting.  Nausea worsened by constipation.  Noted early satiety since COVID, but denied weight loss.  GERD symptoms 2 to 3 days a week when coughing.  Not on a daily PPI.  She has been taking ibuprofen and Aleve when she had COVID, and also took Alka-Seltzer for "6 attacks of gastritis" since COVID.  On exam, she had mild generalized tenderness to palpation of her abdomen.  Overall, suspected symptoms are likely multifactorial in the setting of GERD, gastritis, IBS, unable to exclude PUD, H. pylori,  postinfectious gastroparesis, diverticulitis, or pancreatitis.  Plan to start PPI daily, increase Linzess to 145 mcg daily, check labs, and obtain CT with oral contrast only.  Consider EGD in the future if upper GI symptoms persist.  CT A/P with oral contrast with moderate volume of stool throughout the colon, no acute findings.   Labs completed with PCP dated 02/11/2021.  TSH elevated at 21.3.  Otherwise, CMP, CBC, lipase normal.  She did report constipation despite taking Linzess 145 mcg.  Recommended adding MiraLAX and patient later reported improvement with this regimen.   Thyroid function improved with TSH 2.930 on 03/19/21.    Seen at urgent care 10/04/2021 for suspected gastroenteritis.  She reported 3 days of nausea, diarrhea, abdominal cramping, chills after drinking a bottle from her case of [employer].  She started on Lomotil, given Gas-X, Phenergan, and supportive measures recommended.  Today:  Was sick a couple week ago after drinking [place] with a diarrheal illness. She and her husband were both sick.  Her body is not quite back to its normal, but she is definitely feeling better.  No further diarrhea.  States it takes her body a long time to recover after an acute illness.  She has been eating her yogurt and taking a probiotic.  Prior to a couple weeks ago, she had actually been feeling much better.  Reflux has been well controlled on pantoprazole 40 mg daily.  No dysphagia. Linzess 145 mcg is working very well for her constipation.  She is taking this daily.  Occasional breakthrough  for which she will use MiraLAX and this works well for her.  She has not had any BRBPR or melena.   Her chronic nausea and chronic abdominal pain had also improved with starting pantoprazole and Linzess.  States she would still have morning nausea without vomiting about 3 days a week for which she would take Zofran or more recently Phenergan which she actually feels works better for her.  Nausea  improves after eating something dry for breakfast. Occasional early satiety. Feels better eating small frequent meals.   Her intermittent abdominal pain which can occur across the upper abdomen and lower abdomen described as a "icky feeling" across the upper abdomen and lower abdominal cramping seems to be most closely associated with bowel movements as her symptoms do improve when she has a bowel movement.  No significant postprandial abdominal pain.  No weight loss.  She does have some trouble with bloating that is related to her abdominal discomfort.  States she is very careful to follow a strict gluten-free diet.   In regards to her SMA stenosis, she would like to follow-up on this.  States she is to get routine imaging with vascular prior to moving to [place].  She has an allergy to IV contrast and states taking prednisone will make her heart feel like it is coming out of her body.  She is unable to travel to [facility] to see vascular surgery.   Past Medical History:  Diagnosis Date   Abnormal glandular Papanicolaou smear of cervix    Anxiety    Asthma    Body mass index 30.0-30.9, adult    Celiac disease    Chronic vascular disorder of intestine (HCC)    Fibromyalgia    Graves disease    Headache    Migrains   Hyperlipidemia    Hypertension    Hypothyroidism    IBS (irritable bowel syndrome) With constipation    ILD (interstitial lung disease) (HCC)    Lupus (systemic lupus erythematosus) (HCC)    Obesity, unspecified    Osteoporosis    Raynaud disease    Sjogren's disease (HCC)     Past Surgical History:  Procedure Laterality Date   APPENDECTOMY     CHOLECYSTECTOMY     COLONOSCOPY WITH PROPOFOL N/A 06/10/2020   Surgeon: Lanelle Bal, DO;  diverticulosis and nonbleeding internal hemorrhoids, due for repeat in 2032.   OTHER SURGICAL HISTORY     biopsies on both feet   REDUCTION MAMMAPLASTY      Current Outpatient Medications  Medication Sig Dispense  Refill   acidophilus (RISAQUAD) CAPS capsule Take 1 capsule by mouth daily.     albuterol (VENTOLIN HFA) 108 (90 Base) MCG/ACT inhaler Inhale 1-2 puffs into the lungs every 6 (six) hours as needed for wheezing or shortness of breath.     aspirin EC 81 MG tablet Take 1 tablet (81 mg total) by mouth daily. Swallow whole. 90 tablet 3   atenolol (TENORMIN) 50 MG tablet Take 50 mg by mouth 2 (two) times daily.      BREO ELLIPTA 100-25 MCG/ACT AEPB Inhale 1 puff by mouth once daily 60 each 0   calcium carbonate (TUMS - DOSED IN MG ELEMENTAL CALCIUM) 500 MG chewable tablet Chew 1 tablet by mouth daily.     Cholecalciferol (VITAMIN D3 GUMMIES) 25 MCG (1000 UT) CHEW Chew 6,000 Units by mouth daily.     clonazePAM (KLONOPIN) 0.5 MG tablet Take 0.25-0.5 mg by mouth 2 (two) times daily as  needed for anxiety.      cyclobenzaprine (FLEXERIL) 10 MG tablet Take 10 mg by mouth 3 (three) times daily as needed for muscle spasms.      ELDERBERRY PO Take 1 capsule by mouth daily.     ibuprofen (ADVIL,MOTRIN) 800 MG tablet Take 800 mg by mouth every 8 (eight) hours as needed for moderate pain.     linaclotide (LINZESS) 145 MCG CAPS capsule Take 1 capsule (145 mcg total) by mouth daily before breakfast. 30 capsule 3   meclizine (ANTIVERT) 12.5 MG tablet Take 12.5-25 mg by mouth every 6 (six) hours as needed for dizziness.      Omega-3 Fatty Acids (FISH OIL) 1000 MG CAPS Take 2,000 mg by mouth daily.     ondansetron (ZOFRAN) 4 MG tablet Take 1 tablet (4 mg total) by mouth every 8 (eight) hours as needed for nausea or vomiting. 12 tablet 0   pantoprazole (PROTONIX) 40 MG tablet Take 1 tablet (40 mg total) by mouth daily before breakfast. 30 tablet 3   promethazine (PHENERGAN) 12.5 MG tablet Take 1 tablet (12.5 mg total) by mouth every 6 (six) hours as needed for nausea or vomiting. 15 tablet 0   RESTASIS 0.05 % ophthalmic emulsion Place 1 drop into both eyes 2 (two) times daily.     rosuvastatin (CRESTOR) 5 MG tablet Take  5 mg by mouth 4 (four) times a week.     SYNTHROID 112 MCG tablet Take 112 mcg by mouth. Takes 5 days a week.     No current facility-administered medications for this visit.    Allergies as of 10/13/2021 - Review Complete 10/13/2021  Allergen Reaction Noted   Prednisone Palpitations 12/17/2020   Azithromycin  02/21/2021   Bactrim [sulfamethoxazole-trimethoprim] Nausea And Vomiting 11/16/2018   Codeine  02/21/2021   Denosumab  02/21/2021   Doxycycline  05/07/2020   Gluten meal  11/16/2018   Keflex [cephalexin]  12/09/2018   Latex Hives 11/16/2018   Levaquin [levofloxacin] Nausea And Vomiting 11/16/2018   Sulfa antibiotics Nausea And Vomiting 11/16/2018   Contrast media [iodinated contrast media] Nausea And Vomiting and Rash 11/16/2018    Family History  Problem Relation Age of Onset   Congestive Heart Failure Mother    Aneurysm Father    Celiac disease Brother    Melanoma Brother    Aneurysm Brother        stomach   Stroke Sister    Celiac disease Sister    Irritable bowel syndrome Sister    Celiac disease Brother    Aneurysm Sister    Other Sister        multiple back surgeries   Colon cancer Neg Hx     Social History   Socioeconomic History   Marital status: Married    Spouse name: Not on file   Number of children: Not on file   Years of education: Not on file   Highest education level: Not on file  Occupational History   Not on file  Tobacco Use   Smoking status: Never   Smokeless tobacco: Never  Vaping Use   Vaping Use: Never used  Substance and Sexual Activity   Alcohol use: Not Currently    Comment: wine occ   Drug use: Never   Sexual activity: Not Currently    Birth control/protection: Post-menopausal  Other Topics Concern   Not on file  Social History Narrative   Not on file   Social Determinants of Health   Financial Resource Strain:  Low Risk  (05/14/2021)   Overall Financial Resource Strain (CARDIA)    Difficulty of Paying Living Expenses:  Not hard at all  Food Insecurity: No Food Insecurity (05/14/2021)   Hunger Vital Sign    Worried About Running Out of Food in the Last Year: Never true    Ran Out of Food in the Last Year: Never true  Transportation Needs: No Transportation Needs (05/14/2021)   PRAPARE - Administrator, Civil Service (Medical): No    Lack of Transportation (Non-Medical): No  Physical Activity: Insufficiently Active (05/14/2021)   Exercise Vital Sign    Days of Exercise per Week: 2 days    Minutes of Exercise per Session: 50 min  Stress: Stress Concern Present (05/14/2021)   [employer] of Occupational Health - Occupational Stress Questionnaire    Feeling of Stress : To some extent  Social Connections: Moderately Integrated (05/14/2021)   Social Connection and Isolation Panel [NHANES]    Frequency of Communication with Friends and Family: More than three times a week    Frequency of Social Gatherings with Friends and Family: Once a week    Attends Religious Services: More than 4 times per year    Active Member of [employer] or Organizations: No    Attends Banker Meetings: Never    Marital Status: Married    Review of Systems: Gen: Denies fever, chills, cold or flulike symptoms, presyncope, syncope. CV: Denies chest pain, palpitations. Resp: Denies dyspnea, cough. GI: See HPI Heme: See HPI  Physical Exam: BP 120/75   Pulse 66   Temp (!) 97.2 F (36.2 C)   Ht 5\' 3"  (1.6 m)   Wt 173 lb (78.5 kg)   BMI 30.65 kg/m  General:   Alert and oriented. No distress noted. Pleasant and cooperative.  Head:  Normocephalic and atraumatic. Eyes:  Conjuctiva clear without scleral icterus. Heart:  S1, S2 present without murmurs appreciated. Lungs:  Clear to auscultation bilaterally. No wheezes, rales, or rhonchi. No distress.  Abdomen:  +BS, soft, non-distended. Mild generalized tenderness to palpation. No rebound or guarding. No HSM or masses noted. Msk:  Symmetrical without gross  deformities. Normal posture. Extremities:  Without edema. Neurologic:  Alert and  oriented x4 Psych:  Normal mood and affect.    Assessment:  66 year old female with history of Sjogren's disease, lupus, ILD, HTN, HLD, hypothyroidism, fibromyalgia, IBS-C, diverticulosis, celiac disease (biopsy confirmed), chronic nausea, chronic abdominal pain, 50% blockage of SMA previously followed by vascular prior to moving to [facility], presenting today for follow-up.  IBS-C: Improved with Linzess 145 mcg daily.  Occasional breakthrough constipation that responds well to MiraLAX.  Still with some breakthrough abdominal discomfort that typically improves with a bowel movement.  Also with intermittent abdominal bloating.  No alarm symptoms.  Colonoscopy up-to-date March 2022, due for repeat in 2032.  We will continue her on her current regimen and have her try using IBgard to help with residual discomfort and bloating.  Also discussed avoiding common gas producing items.  Bloating: Likely secondary to IBS-C discussed above.  She does have history of celiac disease the reports strict avoidance of gluten.  I will check TTG IgA to evaluate for potential gluten exposure.  Recommended trial of IBgard or peppermint oil supplement and avoiding common gas producing items.  GERD: Improved/resolved with pantoprazole 40 mg daily.  Chronic nausea: Reports ongoing morning nausea, but overall improved with decreased frequency since starting pantoprazole 40 mg daily for GERD which is now  well controlled.  Denies postprandial symptoms as her nausea actually improves with eating "something dry".  She does continue with intermittent upper abdominal discomfort though this seems to be more related to her IBS symptoms.  No weight loss or melena. History of cholecystectomy.  Labs in November 2022 with normal LFTs and lipase.  CT A/P with oral contrast only with no acute findings.  Differentials include atypical and/or  nocturnal GERD, gastritis, duodenitis, H pylori, PUD, unintentional gluten exposure. Less likely gastroparesis or malignancy. I have recommended EGD for further evaluation.   Celiac disease: History of biopsy-proven celiac disease.  Patient reports compliance with strict gluten-free diet. I will plan to check ttg IgA to evaluate for gluten exposure due to reported nausea and bloating.  Regarding routine monitoring, Vitamin D, B12, folate, iron panel within normal limits in September 2022. She is due for DEXA scan, last DEXA in 2020 with osteoporosis.  She reports she has not received a pneumonia vaccine and I recommended she discuss this with her PCP or pulmonologist.  SMA stenosis: History of 50% blockage of SMA, previously followed by vascular [place] [facility].  Most recent CTA in April 2019 (care everywhere) with mild to moderate stenosis at the origin of the superior mesenteric artery.  IMA and celiac were patent.  We previously tried to refer patient to vascular for ongoing monitoring, but patient states [place] to [place].  She has an allergy to IV contrast.  We discussed premedications with prednisone, but patient states prednisone makes her heart feel like it is coming out of her chest.  We will hold off on a CTA and proceed with a mesenteric ultrasound for follow-up.     Plan:  TTG IgA Proceed with upper endoscopy with propofol by Dr. Marletta Lorarver in near future. The risks, benefits, and alternatives have been discussed with the patient in detail. The patient states understanding and desires to proceed. ASA 3 DEXA  Ultrasound mesenteric arteries Patient will discuss pneumonia vaccine with PCP or pulmonologist. Continue pantoprazole 40 mg daily. Reinforced GERD diet/lifestyle.  Separate written instructions provided.  See AVS. Continue Linzess 145 mcg daily. Continue MiraLAX as needed. Try using IBgard or peppermint oil to help with abdominal discomfort and  bloating. Limit common gas producing items such as green leafy vegetables, cabbage, broccoli, Brussels sprouts, beans, artificial sweeteners, carbonated beverages, drinking through a straw, hard candy, chewing gum. Continue strict gluten-free diet. Follow-up in 3 months or sooner if needed.   Ermalinda MemosKristen Sedonia Kitner, PA-C [facility] 10/13/2021

## 2021-10-13 ENCOUNTER — Ambulatory Visit (INDEPENDENT_AMBULATORY_CARE_PROVIDER_SITE_OTHER): Payer: Medicare Other | Admitting: Gastroenterology

## 2021-10-13 ENCOUNTER — Encounter: Payer: Self-pay | Admitting: Gastroenterology

## 2021-10-13 VITALS — BP 120/75 | HR 66 | Temp 97.2°F | Ht 63.0 in | Wt 173.0 lb

## 2021-10-13 DIAGNOSIS — K900 Celiac disease: Secondary | ICD-10-CM | POA: Diagnosis not present

## 2021-10-13 DIAGNOSIS — R110 Nausea: Secondary | ICD-10-CM | POA: Diagnosis not present

## 2021-10-13 DIAGNOSIS — K581 Irritable bowel syndrome with constipation: Secondary | ICD-10-CM | POA: Diagnosis not present

## 2021-10-13 DIAGNOSIS — K219 Gastro-esophageal reflux disease without esophagitis: Secondary | ICD-10-CM

## 2021-10-13 DIAGNOSIS — R140 Abdominal distension (gaseous): Secondary | ICD-10-CM

## 2021-10-13 DIAGNOSIS — K551 Chronic vascular disorders of intestine: Secondary | ICD-10-CM

## 2021-10-13 NOTE — Patient Instructions (Addendum)
Have labs completed at Dr. Scharlene Gloss office.   We will arrange you to have an upper endoscopy in the near future with Dr. Marletta Lor to further evaluate your nausea.  We will arrange for you to have a bone density scan in the near future.  We will arrange for you to have an ultrasound of your mesenteric arteries to follow-up on SMA stenosis.  For reflux: Continue taking pantoprazole 40 mg daily 30 minutes before breakfast. Follow a GERD diet:  Avoid fried, fatty, greasy, spicy, citrus foods. Avoid caffeine and carbonated beverages. Avoid chocolate. Try eating 4-6 small meals a day rather than 3 large meals. Do not eat within 3 hours of laying down. Prop head of bed up on wood or bricks to create a 6 inch incline.  For IBS: Continue taking Linzess 145 mcg daily. For occasional breakthrough constipation, use MiraLAX 17 g in 8 ounces of water. You can try using IBgard or peppermint oil supplement to help with abdominal discomfort and bloating.  Limit common gas producing items such as green leafy vegetables, cabbage, broccoli, Brussels sprouts, beans, artificial sweeteners, carbonated beverages, drinking through a straw, hard candy, chewing gum.  Continue with strict gluten-free diet.  We will plan to follow-up with you in about 3 months.  Do not hesitate to call sooner if you have any questions or concerns.  It was great to see you again today!  Ermalinda Memos, PA-C [facility]

## 2021-10-14 ENCOUNTER — Telehealth: Payer: Self-pay | Admitting: *Deleted

## 2021-10-14 NOTE — Telephone Encounter (Signed)
LMOVM to call back to schedule EGD with Dr. Marletta Lor ASA 3

## 2021-10-15 ENCOUNTER — Encounter: Payer: Self-pay | Admitting: *Deleted

## 2021-10-15 NOTE — Telephone Encounter (Signed)
Pt returned call. Scheduled for 8/28 at 8am. Aware will send instructions/pre-op via mychart

## 2021-10-16 ENCOUNTER — Telehealth: Payer: Self-pay | Admitting: *Deleted

## 2021-10-16 NOTE — Telephone Encounter (Signed)
-----   Message from Katie Amis, RN sent at 10/16/2021  1:44 PM EDT ----- [name]! I just looked at her chart and she wont need any labs. She has an EKG from this year so she can actually be a phone call!  ----- Message ----- From: Armstead Peaks, CMA Sent: 10/16/2021   1:26 PM EDT To: Katie Amis, RN  This patient called. She said she called and spoke with some one at [phone] this morning. She has a pre-op appointment scheduled for 8/24 and procedure 8/28. She wants her labs drawn that she needs at Dr. Scharlene Gloss office and not the [facility]. She was told that she needed to get her lab orders from [place] as we enter those. We don't enter the pre-op labs if they are ASA 3. Can you help?

## 2021-10-18 ENCOUNTER — Other Ambulatory Visit: Payer: Self-pay | Admitting: Pulmonary Disease

## 2021-10-21 ENCOUNTER — Telehealth: Payer: Self-pay | Admitting: *Deleted

## 2021-10-21 NOTE — Telephone Encounter (Signed)
Spoke to pt, she informed me that she is still having  lower abdominal pains. States she not sure if it is still the bacterial infection or what. She is still gassy, bloated and has nausea. She is still doing the [employer]. Would like to know if she could have a antibiotic sent to pharmacy.

## 2021-10-21 NOTE — Telephone Encounter (Signed)
Most all of these symptoms are chronic. No indication for an antibiotic right now. I do not think she has a bacterial infection unless she has had recurrent frequent, watery diarrhea. It may take some time to get back to her baseline as she did have an acute diarrheal illness several weeks ago.   Overall, I suspect her intermittent lower abdominal discomfort may be related to IBS. She was controlling IBS-C with Linzess 145 mcg daily. If she feels she isn't having good productive Bms daily, we can try increasing her dose of Linzess to 290 mcg daily and see how this does.   Has she added IBGARD as recommended at her OV to see if this will help with her pain and bloating?  She also needs to have blood work complete to screen for possible gluten exposure.   She is scheduled for an EGD in the coming days which will help evaluate her nausea.  She is also scheduled for mesenteric [place] in the coming days which will also help evaluate her nausea and abdominal pain.

## 2021-10-22 NOTE — Telephone Encounter (Signed)
Noted. I did receive her blood work. Ttg IgA is less than 2 suggesting she is doing a good job avoiding gluten.

## 2021-10-22 NOTE — Telephone Encounter (Signed)
Noted. She did see the results.

## 2021-10-22 NOTE — Telephone Encounter (Signed)
Spoke to pt, informed her of recommendations. Pt voiced understanding. She informed me that her bowel movements were fine. She has not started taking IBGARD yet. Informed to keep up coming procedures.

## 2021-10-23 ENCOUNTER — Ambulatory Visit (HOSPITAL_COMMUNITY)
Admission: RE | Admit: 2021-10-23 | Discharge: 2021-10-23 | Disposition: A | Discharge status: Home / Self Care | Payer: Medicare Other | Source: Ambulatory Visit | Attending: Gastroenterology | Admitting: Gastroenterology

## 2021-10-23 ENCOUNTER — Encounter: Payer: Self-pay | Admitting: Internal Medicine

## 2021-10-23 ENCOUNTER — Telehealth: Payer: Self-pay | Admitting: Cardiology

## 2021-10-23 DIAGNOSIS — Z780 Asymptomatic menopausal state: Secondary | ICD-10-CM | POA: Diagnosis not present

## 2021-10-23 DIAGNOSIS — M810 Age-related osteoporosis without current pathological fracture: Secondary | ICD-10-CM | POA: Diagnosis not present

## 2021-10-23 DIAGNOSIS — K900 Celiac disease: Secondary | ICD-10-CM | POA: Diagnosis not present

## 2021-10-23 DIAGNOSIS — R109 Unspecified abdominal pain: Secondary | ICD-10-CM | POA: Diagnosis not present

## 2021-10-23 DIAGNOSIS — K551 Chronic vascular disorders of intestine: Secondary | ICD-10-CM | POA: Insufficient documentation

## 2021-10-23 DIAGNOSIS — M85851 Other specified disorders of bone density and structure, right thigh: Secondary | ICD-10-CM | POA: Diagnosis not present

## 2021-10-23 NOTE — Telephone Encounter (Signed)
  Pt said, she had a bone density and mesenteric arteries tests done today. She would like to ask if Dr. Harl Bowie can look at the result

## 2021-10-23 NOTE — Telephone Encounter (Signed)
error 

## 2021-10-27 ENCOUNTER — Encounter (HOSPITAL_COMMUNITY): Payer: Self-pay | Admitting: Anesthesiology

## 2021-10-27 NOTE — Telephone Encounter (Signed)
I left message for patient to follow up with GI as they were the ones who ordered the tests.

## 2021-10-27 NOTE — Telephone Encounter (Signed)
Would have to defer to GI who it appears ordered the tests and would be better to translate the results for her  Katie Abts MD

## 2021-10-28 ENCOUNTER — Telehealth: Payer: Self-pay | Admitting: *Deleted

## 2021-10-28 DIAGNOSIS — R1084 Generalized abdominal pain: Secondary | ICD-10-CM

## 2021-10-28 DIAGNOSIS — M3500 Sicca syndrome, unspecified: Secondary | ICD-10-CM | POA: Diagnosis not present

## 2021-10-28 DIAGNOSIS — R197 Diarrhea, unspecified: Secondary | ICD-10-CM | POA: Diagnosis not present

## 2021-10-28 DIAGNOSIS — R110 Nausea: Secondary | ICD-10-CM

## 2021-10-28 DIAGNOSIS — I771 Stricture of artery: Secondary | ICD-10-CM

## 2021-10-28 DIAGNOSIS — K551 Chronic vascular disorders of intestine: Secondary | ICD-10-CM

## 2021-10-28 DIAGNOSIS — E039 Hypothyroidism, unspecified: Secondary | ICD-10-CM | POA: Diagnosis not present

## 2021-10-28 DIAGNOSIS — G894 Chronic pain syndrome: Secondary | ICD-10-CM | POA: Diagnosis not present

## 2021-10-28 NOTE — Addendum Note (Signed)
Addended by: Cheron Every on: 10/28/2021 12:53 PM   Modules accepted: Orders

## 2021-10-28 NOTE — Telephone Encounter (Signed)
Received VM from pt requesting referral to Dr. Donnetta Hutching since he does see patients here in [place]. She also had several questions regarding CT scan vs MRI scan and being pre medicated. Cyril Mourning can you please call patient and discuss with her? Thanks!

## 2021-10-28 NOTE — Telephone Encounter (Signed)
We can definitely refer her to Dr. Donnetta Hutching. I will defer next best imaging to him as well as discussion about what/if premedications are needed.   Mindy: Please place referral. Dx: Celiac artery stenosis, history of SMA stenosis, [place] vascular [place] of state, chronic abdominal pain, chronic nausea.

## 2021-10-28 NOTE — Telephone Encounter (Signed)
Referral sent and mychart message sent

## 2021-10-31 ENCOUNTER — Ambulatory Visit: Payer: Medicare Other | Admitting: Cardiology

## 2021-10-31 ENCOUNTER — Encounter: Payer: Self-pay | Admitting: Cardiology

## 2021-10-31 VITALS — BP 120/80 | HR 64 | Ht 63.0 in | Wt 173.2 lb

## 2021-10-31 DIAGNOSIS — I447 Left bundle-branch block, unspecified: Secondary | ICD-10-CM | POA: Diagnosis not present

## 2021-10-31 DIAGNOSIS — I2510 Atherosclerotic heart disease of native coronary artery without angina pectoris: Secondary | ICD-10-CM | POA: Diagnosis not present

## 2021-10-31 DIAGNOSIS — E782 Mixed hyperlipidemia: Secondary | ICD-10-CM

## 2021-10-31 NOTE — Progress Notes (Signed)
Clinical Summary Ms. Ozga is a 66 y.o.female seen today for follow up of the following medical problems.   1.Coronary artery calcifications/atherosclerosis - noted by 02/2021 chest CT - no chest pains. SOB is improving with resolving COVID infection.  CAD risk factor: hyperlipidemia - she is already on statin  -Jan 2023 nuclear stress: artifact vs very small area of ischemia     2. Abnormal CT scan - 02/2021 CT scan showed enlarged pulmonic trunk, suggesting pulm HTN - 02/2021 echo LVEF 00-37%, normal diastolic, normal RV     3. Interstitial lung disease - followed by pulmonary    4. Severe persistent asthma - followed by pulmonary   5. LBBB - unclear chronicity, no prior EKGs in system -  02/2021 echo LVEF 04-88%, normal diastolic, normal RV   6. Hyperlipidemia - LDL 01/2021 100 - crestor causes some joint pains. Taking rosuvatatin 61m 4 times a week.   7. Celiaic artery stenosis - upcmoing appt with vascular Past Medical History:  Diagnosis Date   Abnormal glandular Papanicolaou smear of cervix    Anxiety    Asthma    Body mass index 30.0-30.9, adult    Celiac disease    Chronic vascular disorder of intestine (HCC)    Fibromyalgia    Graves disease    Headache    Migrains   Hyperlipidemia    Hypertension    Hypothyroidism    IBS (irritable bowel syndrome) With constipation    ILD (interstitial lung disease) (HCC)    Lupus (systemic lupus erythematosus) (HCC)    Obesity, unspecified    Osteoporosis    Raynaud disease    Sjogren's disease (HCC)      Allergies  Allergen Reactions   Prednisone Palpitations   Azithromycin     vomiting   Bactrim [Sulfamethoxazole-Trimethoprim] Nausea And Vomiting    Migraines    Codeine Nausea And Vomiting   Denosumab     Other reaction(s): Unknown   Gluten Meal     Ceiliac disease    Keflex [Cephalexin] Nausea And Vomiting   Latex Hives   Levaquin [Levofloxacin] Nausea And Vomiting   Sulfa Antibiotics  Nausea And Vomiting   Contrast Media [Iodinated Contrast Media] Nausea And Vomiting and Rash    Per patient, did fine with premedication with IV steroids before    Doxycycline Palpitations    Made body feel crazy, increased heart rate     Current Outpatient Medications  Medication Sig Dispense Refill   acidophilus (RISAQUAD) CAPS capsule Take 1 capsule by mouth daily.     albuterol (VENTOLIN HFA) 108 (90 Base) MCG/ACT inhaler Inhale 1-2 puffs into the lungs every 6 (six) hours as needed for wheezing or shortness of breath.     aspirin EC 81 MG tablet Take 1 tablet (81 mg total) by mouth daily. Swallow whole. 90 tablet 3   atenolol (TENORMIN) 50 MG tablet Take 50 mg by mouth 2 (two) times daily.      BREO ELLIPTA 100-25 MCG/ACT AEPB Inhale 1 puff by mouth once daily (Patient taking differently: Inhale 1 puff into the lungs daily.) 60 each 0   calcium carbonate (TUMS EX) 750 MG chewable tablet Chew 2 tablets by mouth daily.     Cholecalciferol (VITAMIN D3 GUMMIES) 25 MCG (1000 UT) CHEW Chew 6,000 Units by mouth daily.     clonazePAM (KLONOPIN) 0.5 MG tablet Take 0.25-0.5 mg by mouth 2 (two) times daily as needed for anxiety.      ELDERBERRY  PO Take 1 capsule by mouth daily.     linaclotide (LINZESS) 145 MCG CAPS capsule Take 1 capsule (145 mcg total) by mouth daily before breakfast. 30 capsule 3   Omega-3 Fatty Acids (FISH OIL) 1000 MG CAPS Take 2,000 mg by mouth daily.     ondansetron (ZOFRAN) 4 MG tablet Take 1 tablet (4 mg total) by mouth every 8 (eight) hours as needed for nausea or vomiting. 12 tablet 0   pantoprazole (PROTONIX) 40 MG tablet Take 1 tablet (40 mg total) by mouth daily before breakfast. 30 tablet 3   promethazine (PHENERGAN) 12.5 MG tablet Take 1 tablet (12.5 mg total) by mouth every 6 (six) hours as needed for nausea or vomiting. 15 tablet 0   RESTASIS 0.05 % ophthalmic emulsion Place 1 drop into both eyes 2 (two) times daily.     rosuvastatin (CRESTOR) 5 MG tablet Take 5  mg by mouth 4 (four) times a week.     SYNTHROID 112 MCG tablet Take 112 mcg by mouth daily before breakfast.     No current facility-administered medications for this visit.     Past Surgical History:  Procedure Laterality Date   APPENDECTOMY     CHOLECYSTECTOMY     COLONOSCOPY WITH PROPOFOL N/A 06/10/2020   Surgeon: Eloise Harman, DO;  diverticulosis and nonbleeding internal hemorrhoids, due for repeat in 2032.   OTHER SURGICAL HISTORY     biopsies on both feet   REDUCTION MAMMAPLASTY       Allergies  Allergen Reactions   Prednisone Palpitations   Azithromycin     vomiting   Bactrim [Sulfamethoxazole-Trimethoprim] Nausea And Vomiting    Migraines    Codeine Nausea And Vomiting   Denosumab     Other reaction(s): Unknown   Gluten Meal     Ceiliac disease    Keflex [Cephalexin] Nausea And Vomiting   Latex Hives   Levaquin [Levofloxacin] Nausea And Vomiting   Sulfa Antibiotics Nausea And Vomiting   Contrast Media [Iodinated Contrast Media] Nausea And Vomiting and Rash    Per patient, did fine with premedication with IV steroids before    Doxycycline Palpitations    Made body feel crazy, increased heart rate      Family History  Problem Relation Age of Onset   Congestive Heart Failure Mother    Aneurysm Father    Celiac disease Brother    Melanoma Brother    Aneurysm Brother        stomach   Stroke Sister    Celiac disease Sister    Irritable bowel syndrome Sister    Celiac disease Brother    Aneurysm Sister    Other Sister        multiple back surgeries   Colon cancer Neg Hx      Social History Ms. Maggi reports that she has never smoked. She has never used smokeless tobacco. Ms. Carvin reports that she does not currently use alcohol.   Review of Systems CONSTITUTIONAL: No weight loss, fever, chills, weakness or fatigue.  HEENT: Eyes: No visual loss, blurred vision, double vision or yellow sclerae.No hearing loss, sneezing, congestion, runny nose  or sore throat.  SKIN: No rash or itching.  CARDIOVASCULAR: per hpi RESPIRATORY: No shortness of breath, cough or sputum.  GASTROINTESTINAL: No anorexia, nausea, vomiting or diarrhea. No abdominal pain or blood.  GENITOURINARY: No burning on urination, no polyuria NEUROLOGICAL: No headache, dizziness, syncope, paralysis, ataxia, numbness or tingling in the extremities. No change in bowel  or bladder control.  MUSCULOSKELETAL: No muscle, back pain, joint pain or stiffness.  LYMPHATICS: No enlarged nodes. No history of splenectomy.  PSYCHIATRIC: No history of depression or anxiety.  ENDOCRINOLOGIC: No reports of sweating, cold or heat intolerance. No polyuria or polydipsia.  Marland Kitchen   Physical Examination Today's Vitals   10/31/21 0959  BP: 120/80  Pulse: 64  SpO2: 97%  Weight: 173 lb 3.2 oz (78.6 kg)  Height: 5' 3"  (1.6 m)   Body mass index is 30.68 kg/m.  Gen: resting comfortably, no acute distress HEENT: no scleral icterus, pupils equal round and reactive, no palptable cervical adenopathy,  CV: RRR, no m/r/g, no jvd Resp: Clear to auscultation bilaterally GI: abdomen is soft, non-tender, non-distended, normal bowel sounds, no hepatosplenomegaly MSK: extremities are warm, no edema.  Skin: warm, no rash Neuro:  no focal deficits Psych: appropriate affect   Diagnostic Studies  Jan 2023 nuclear stress  The study is low risk.   No ST deviation was noted.   LV perfusion is abnormal. Defect 1: There is a small defect with mild reduction in uptake present in the apical to mid anteroseptal location(s) and apex that is partially reversible. There is normal wall motion in the defect area. May be consistent with artifact given normal wall motion vs small area of ischemia. Overall low risk.   Left ventricular function is normal. End diastolic cavity size is normal.   Assessment and Plan   1.Coronary atherosclerosis/LBBB - coronary atherosclerosis noted on CT scan - denies chest pain,  chronic SOB related to lung disease though improving - LBBB on EKG that is chrnoic - lexiscan was low risk - continue risk factor modification  2. Hyperlipidemia - continue crestor, some trouble tolerating. Taking 56m 4 days a week - request labs from pcp       JArnoldo Lenis M.D.

## 2021-10-31 NOTE — Patient Instructions (Signed)
Medication Instructions:  Your physician recommends that you continue on your current medications as directed. Please refer to the Current Medication list given to you today.   Labwork: None  Testing/Procedures: None  Follow-Up: Follow up with Dr. Harl Bowie in 6 months.   Any Other Special Instructions Will Be Listed Below (If Applicable).     If you need a refill on your cardiac medications before your next appointment, please call your pharmacy.

## 2021-11-03 ENCOUNTER — Encounter: Payer: Self-pay | Admitting: Internal Medicine

## 2021-11-03 ENCOUNTER — Encounter: Payer: Self-pay | Admitting: Women's Health

## 2021-11-03 ENCOUNTER — Ambulatory Visit (INDEPENDENT_AMBULATORY_CARE_PROVIDER_SITE_OTHER): Payer: Medicare Other

## 2021-11-03 ENCOUNTER — Other Ambulatory Visit: Payer: Self-pay | Admitting: Women's Health

## 2021-11-03 DIAGNOSIS — Z86018 Personal history of other benign neoplasm: Secondary | ICD-10-CM

## 2021-11-03 DIAGNOSIS — R102 Pelvic and perineal pain: Secondary | ICD-10-CM

## 2021-11-03 NOTE — Progress Notes (Signed)
PELVIC US TA/TV:(Limited view T/A),heterogeneous anteflexed uterus,intramural fundal right fibroid 1.3 x 1.2 x 1 cm,EEC 2.6 mm,normal ovaries,ovaries appear mobile,no pain during ultrasound,no free fluid  Chaperone Terrence Dupont

## 2021-11-05 ENCOUNTER — Encounter: Payer: Self-pay | Admitting: Vascular Surgery

## 2021-11-05 ENCOUNTER — Encounter (HOSPITAL_COMMUNITY)
Admission: RE | Admit: 2021-11-05 | Discharge: 2021-11-05 | Disposition: A | Discharge status: Home / Self Care | Payer: Medicare Other | Source: Ambulatory Visit | Attending: Internal Medicine | Admitting: Internal Medicine

## 2021-11-05 ENCOUNTER — Encounter (HOSPITAL_COMMUNITY): Payer: Self-pay

## 2021-11-05 ENCOUNTER — Ambulatory Visit: Payer: Medicare Other | Admitting: Vascular Surgery

## 2021-11-05 VITALS — BP 131/84 | HR 72 | Temp 97.3°F | Ht 63.0 in | Wt 175.0 lb

## 2021-11-05 DIAGNOSIS — I771 Stricture of artery: Secondary | ICD-10-CM

## 2021-11-05 NOTE — Progress Notes (Signed)
[facility] [place]  Patient name: Katie Davis MRN: [id number] DOB: [birth year] Sex: female  REASON FOR CONSULT: Follow-up history of celiac artery stenosis  HPI: Katie Davis is a 67 y.o. female, who is here today to establish vascular care.  She has a very complicated past history.  She has a decades long history of abdominal issues with constipation alternating with diarrhea and abdominal discomfort.  She was diagnosed with celiac disease.  She continues to have issues with irritable bowel syndrome.  Also has multiple other autoimmune diseases such as lupus and fibromyalgia.  She was living in [place] when she underwent CT scan for evaluation of an episode of abdominal inflammation flareup.  She had an incidental finding of narrowing of her celiac artery.  She is seen today for follow-up of this.  She was told that time that this was moderate and that there was no treatment needed for this.  She has had several duplex evaluation including showing no evidence of critical disease.  She recently underwent duplex of her mesenteric vessels at [facility] on 10/23/2021 and we have this for discussion.  She has been unable to be active due to her medical issues.  She reports that she has had weight gain secondary to this.  Past Medical History:  Diagnosis Date   Abnormal glandular Papanicolaou smear of cervix    Anxiety    Asthma    Body mass index 30.0-30.9, adult    Celiac disease    Chronic vascular disorder of intestine (HCC)    Fibromyalgia    Graves disease    Headache    Migrains   Hyperlipidemia    Hypertension    Hypothyroidism    IBS (irritable bowel syndrome) With constipation    ILD (interstitial lung disease) (HCC)    Lupus (systemic lupus erythematosus) (HCC)    Obesity, unspecified    Osteoporosis    Raynaud disease    Sjogren's disease ([place])     Family History  Problem Relation Age of Onset    Congestive Heart Failure Mother    Aneurysm Father    Celiac disease Brother    Melanoma Brother    Aneurysm Brother        stomach   Stroke Sister    Celiac disease Sister    Irritable bowel syndrome Sister    Celiac disease Brother    Aneurysm Sister    Other Sister        multiple back surgeries   Colon cancer Neg Hx     SOCIAL HISTORY: Social History   Socioeconomic History   Marital status: Married    Spouse name: Not on file   Number of children: Not on file   Years of education: Not on file   Highest education level: Not on file  Occupational History   Not on file  Tobacco Use   Smoking status: Never   Smokeless tobacco: Never  Vaping Use   Vaping Use: Never used  Substance and Sexual Activity   Alcohol use: Not Currently    Comment: wine occ   Drug use: Never   Sexual activity: Not Currently    Birth control/protection: Post-menopausal  Other Topics Concern   Not on file  Social History Narrative   Not on file   Social Determinants of Health   Financial Resource Strain: Low Risk  (05/14/2021)   Overall Financial Resource Strain (CARDIA)    Difficulty of Paying Living Expenses: Not hard at  all  Food Insecurity: No Food Insecurity (05/14/2021)   Hunger Vital Sign    Worried About Running Out of Food in the Last Year: Never true    Ran Out of Food in the Last Year: Never true  Transportation Needs: No Transportation Needs (05/14/2021)   PRAPARE - Hydrologist (Medical): No    Lack of Transportation (Non-Medical): No  Physical Activity: Insufficiently Active (05/14/2021)   Exercise Vital Sign    Days of Exercise per Week: 2 days    Minutes of Exercise per Session: 50 min  Stress: Stress Concern Present (05/14/2021)   [place]    Feeling of Stress : To some extent  Social Connections: Moderately Integrated (05/14/2021)   Social Connection and Isolation Panel [NHANES]     Frequency of Communication with Friends and Family: More than three times a week    Frequency of Social Gatherings with Friends and Family: Once a week    Attends Religious Services: More than 4 times per year    Active Member of [employer] or Organizations: No    Attends Archivist Meetings: Never    Marital Status: Married  Human resources officer Violence: Not At Risk (05/14/2021)   Humiliation, Afraid, Rape, and Kick questionnaire    Fear of Current or Ex-Partner: No    Emotionally Abused: No    Physically Abused: No    Sexually Abused: No    Allergies  Allergen Reactions   Prednisone Palpitations   Azithromycin     vomiting   Bactrim [Sulfamethoxazole-Trimethoprim] Nausea And Vomiting    Migraines    Codeine Nausea And Vomiting   Denosumab     Other reaction(s): Unknown   Gluten Meal     Ceiliac disease    Keflex [Cephalexin] Nausea And Vomiting   Latex Hives   Levaquin [Levofloxacin] Nausea And Vomiting   Sulfa Antibiotics Nausea And Vomiting   Contrast Media [Iodinated Contrast Media] Nausea And Vomiting and Rash    Per patient, did fine with premedication with IV steroids before    Doxycycline Palpitations    Made body feel crazy, increased heart rate    Current Outpatient Medications  Medication Sig Dispense Refill   acidophilus (RISAQUAD) CAPS capsule Take 1 capsule by mouth daily.     albuterol (VENTOLIN HFA) 108 (90 Base) MCG/ACT inhaler Inhale 1-2 puffs into the lungs every 6 (six) hours as needed for wheezing or shortness of breath.     aspirin EC 81 MG tablet Take 1 tablet (81 mg total) by mouth daily. Swallow whole. 90 tablet 3   atenolol (TENORMIN) 50 MG tablet Take 50 mg by mouth 2 (two) times daily.      BREO ELLIPTA 100-25 MCG/ACT AEPB Inhale 1 puff by mouth once daily (Patient taking differently: Inhale 1 puff into the lungs daily.) 60 each 0   calcium carbonate (TUMS EX) 750 MG chewable tablet Chew 2 tablets by mouth daily.     Cholecalciferol  (VITAMIN D3 GUMMIES) 25 MCG (1000 UT) CHEW Chew 6,000 Units by mouth daily.     clonazePAM (KLONOPIN) 0.5 MG tablet Take 0.25-0.5 mg by mouth 2 (two) times daily as needed for anxiety.      ELDERBERRY PO Take 1 capsule by mouth daily.     linaclotide (LINZESS) 145 MCG CAPS capsule Take 1 capsule (145 mcg total) by mouth daily before breakfast. 30 capsule 3   Omega-3 Fatty Acids (FISH OIL) 1000  MG CAPS Take 2,000 mg by mouth daily.     ondansetron (ZOFRAN) 4 MG tablet Take 1 tablet (4 mg total) by mouth every 8 (eight) hours as needed for nausea or vomiting. 12 tablet 0   pantoprazole (PROTONIX) 40 MG tablet Take 1 tablet (40 mg total) by mouth daily before breakfast. 30 tablet 3   pilocarpine (SALAGEN) 5 MG tablet Take 5 mg by mouth 3 (three) times daily.     promethazine (PHENERGAN) 12.5 MG tablet Take 1 tablet (12.5 mg total) by mouth every 6 (six) hours as needed for nausea or vomiting. 15 tablet 0   RESTASIS 0.05 % ophthalmic emulsion Place 1 drop into both eyes 2 (two) times daily.     rosuvastatin (CRESTOR) 5 MG tablet Take 5 mg by mouth 4 (four) times a week.     SYNTHROID 112 MCG tablet Take 112 mcg by mouth daily before breakfast.     No current facility-administered medications for this visit.    REVIEW OF SYSTEMS:  [X]  denotes positive finding, [ ]  denotes negative finding Cardiac  Comments:  Chest pain or chest pressure:    Shortness of breath upon exertion:    Short of breath when lying flat:    Irregular heart rhythm:        Vascular    Pain in calf, thigh, or hip brought on by ambulation:    Pain in feet at night that wakes you up from your sleep:     Blood clot in your veins:    Leg swelling:         Pulmonary    Oxygen at home:    Productive cough:     Wheezing:         Neurologic    Sudden weakness in arms or legs:     Sudden numbness in arms or legs:     Sudden onset of difficulty speaking or slurred speech:    Temporary loss of vision in one eye:      Problems with dizziness:         Gastrointestinal    Blood in stool:     Vomited blood:         Genitourinary    Burning when urinating:     Blood in urine:        Psychiatric    Major depression:         Hematologic    Bleeding problems:    Problems with blood clotting too easily:        Skin    Rashes or ulcers:        Constitutional    Fever or chills:      PHYSICAL EXAM: Vitals:   11/05/21 1145  BP: 131/84  Pulse: 72  Temp: (!) 97.3 F (36.3 C)  SpO2: 95%  Weight: 175 lb (79.4 kg)  Height: 5' 3"  (1.6 m)    GENERAL: The patient is a well-nourished female, in no acute distress. The vital signs are documented  PULMONARY: There is good air exchange  MUSCULOSKELETAL: There are no major deformities or cyanosis. NEUROLOGIC: No focal weakness or paresthesias are detected. SKIN: There are no ulcers or rashes noted. PSYCHIATRIC: The patient has a normal affect.  DATA:  I reviewed her duplex with her.  This showed no evidence of significant stenosis in her celiac, SMA or IMA.  She had a noncontrast study in our system of her abdomen and pelvis from November 2022.  This for evaluation of abdominal  discomfort.  I reviewed her actual films with her.  This showed minimal atherosclerotic plaque in her aorta and mesenteric vessels.  I explained that this is a noncontrast study making it impossible to rule out any narrowing.  She certainly does not have any evidence of severe atherosclerotic disease.  She also does not have any evidence of median arcuate ligament syndrome on this noncontrast study.  MEDICAL ISSUES: I feel that she has minimal if any risk of mesenteric ischemia related to what must be a mild of celiac stenosis documented 5 years ago.  I reviewed this with the patient.  Would not recommend any ongoing duplex since her recent study was near normal.  She has no symptoms of mesenteric ischemia such as postprandial pain.  She was reassured with this discussion and will  see [place] again on an as-needed basis   Rosetta Posner, MD [facility] [phone] Pager [phone]  Note: Portions of this report may have been transcribed using voice recognition software.  Every effort has been made to ensure accuracy; however, inadvertent computerized transcription errors may still be present.

## 2021-11-06 ENCOUNTER — Ambulatory Visit: Payer: Medicare Other | Admitting: Obstetrics & Gynecology

## 2021-11-06 ENCOUNTER — Other Ambulatory Visit (HOSPITAL_COMMUNITY): Payer: Self-pay | Admitting: Family Medicine

## 2021-11-06 ENCOUNTER — Other Ambulatory Visit (HOSPITAL_COMMUNITY): Payer: Medicare Other

## 2021-11-06 DIAGNOSIS — Z1231 Encounter for screening mammogram for malignant neoplasm of breast: Secondary | ICD-10-CM

## 2021-11-07 ENCOUNTER — Telehealth: Payer: Self-pay | Admitting: Internal Medicine

## 2021-11-07 ENCOUNTER — Telehealth: Payer: Self-pay | Admitting: Pulmonary Disease

## 2021-11-07 MED ORDER — CEFUROXIME AXETIL 500 MG PO TABS
500.0000 mg | ORAL_TABLET | Freq: Two times a day (BID) | ORAL | 0 refills | Status: DC
Start: 2021-11-07 — End: 2021-11-20

## 2021-11-07 MED ORDER — METHYLPREDNISOLONE 4 MG PO TABS: ORAL_TABLET | ORAL | 0 refills | Status: AC

## 2021-11-07 NOTE — Telephone Encounter (Signed)
Patient is aware of below message/recommendations and voiced her understanding.  Nothing further needed.  

## 2021-11-07 NOTE — Telephone Encounter (Signed)
Called and spoke to patient.  C/o low grade temp of 99, chills, sweats, nasal/chest congestion, headache, increased fatigued, prod cough with white sputum, slight increased SOB and wheezing x5d. Sx have worsened over the past 2 days. Negative home covid test today.  She is using albuterol QHS and Breo once daily. Preferred pharmacy is [facility].    Dr. Halford Chessman, please advise. Thanks

## 2021-11-07 NOTE — Telephone Encounter (Signed)
Please let her know I have sent scripts for cefuroxime antibiotic and medrol.  If not improving over the weekend, then she should call the office to schedule an ROV next week.

## 2021-11-07 NOTE — Telephone Encounter (Signed)
Patient called this afternoon.  Congested having respiratory issues.  Feels that she needs to cancel her EGD for this coming Monday.  She is on the phone with her pulmonologist trying to get some antibiotics.  I told her that we will go ahead and cancel her procedure for Monday and when she is feeling better call the office and we will reschedule.

## 2021-11-10 ENCOUNTER — Ambulatory Visit (HOSPITAL_COMMUNITY): Admission: RE | Admit: 2021-11-10 | Payer: Medicare Other | Source: Home / Self Care

## 2021-11-10 ENCOUNTER — Encounter (HOSPITAL_COMMUNITY): Admission: RE | Payer: Self-pay | Source: Home / Self Care

## 2021-11-10 SURGERY — ESOPHAGOGASTRODUODENOSCOPY (EGD) WITH PROPOFOL
Anesthesia: Monitor Anesthesia Care

## 2021-11-10 NOTE — Telephone Encounter (Signed)
Noted. Will await pt call back to reschedule

## 2021-11-10 NOTE — Telephone Encounter (Signed)
Communication noted. Routing to [employer].

## 2021-11-11 ENCOUNTER — Other Ambulatory Visit: Payer: Self-pay | Admitting: Pulmonary Disease

## 2021-11-20 ENCOUNTER — Ambulatory Visit: Payer: Medicare Other | Admitting: Pulmonary Disease

## 2021-11-20 ENCOUNTER — Telehealth: Payer: Self-pay | Admitting: Pulmonary Disease

## 2021-11-20 ENCOUNTER — Encounter: Payer: Self-pay | Admitting: Pulmonary Disease

## 2021-11-20 VITALS — BP 132/76 | HR 68 | Temp 97.7°F | Ht 63.0 in | Wt 174.6 lb

## 2021-11-20 DIAGNOSIS — J849 Interstitial pulmonary disease, unspecified: Secondary | ICD-10-CM | POA: Diagnosis not present

## 2021-11-20 DIAGNOSIS — J4550 Severe persistent asthma, uncomplicated: Secondary | ICD-10-CM

## 2021-11-20 DIAGNOSIS — Z8616 Personal history of COVID-19: Secondary | ICD-10-CM

## 2021-11-20 NOTE — Patient Instructions (Signed)
Follow up in 6 months 

## 2021-11-20 NOTE — Telephone Encounter (Signed)
"  Pt forgot to ask Dr. Halford Chessman about getting the most recent covid booster and pneumonia shot.  Safe to get them, separate time wise, etc."  Dr. Halford Chessman please advise, do you recommend patient get the most recent covid booster and pneumonia shot and if so when should she get these?  Thanks!

## 2021-11-20 NOTE — Progress Notes (Signed)
[name]  Chief Complaint  Patient presents with   Follow-up    Pt is still not feeling well. Had sick call on 8/25     Constitutional:  BP 132/76 (BP Location: Right Arm, Patient Position: Sitting)   Pulse 68   Temp 97.7 F (36.5 C) (Temporal)   Ht 5' 3"  (1.6 m)   Wt 174 lb 9.6 oz (79.2 kg)   SpO2 97% Comment: ra  BMI 30.93 kg/m   Past Medical History:  Anxiety, Celiac disease, Fibromyalgia, Grave's disease, HLD, Migraine headaches, HTN, IBS, Lupus, Osteoporosis, Raynaud disease, Sjogren's disease, COVID 19 August/September/October 2022  Past Surgical History:  She  has a past surgical history that includes Reduction mammaplasty; Cholecystectomy; Appendectomy; OTHER SURGICAL HISTORY; and Colonoscopy with propofol (N/A, 06/10/2020).  Brief Summary:  Katie Davis is a 66 y.o. female with asthma and history of COVID.      Subjective:   She called in for cefuroxime and medrol scripts at the end of August.  These helped tremendously.  She isn't fully recovered but feels much better.  She is planning to resume exercise program at the gym.  Breo working well.  She still has some rough days, but these aren't happening as often.  She feels like she is continuing to slowly improve.  She has appointment with endocrinology to discuss management of osteoporosis.  Thyroid function has been stable.  Physical Exam:   Appearance - well kempt   ENMT - no sinus tenderness, no oral exudate, no LAN, Mallampati 3 airway, no stridor  Respiratory - equal breath sounds bilaterally, no wheezing or rales  CV - s1s2 regular rate and rhythm, no murmurs  Ext - no clubbing, no edema  Skin - no rashes  Psych - normal mood and affect    Pulmonary testing:  PFT 02/27/21 >> FEV1 1.95 (82%), FEV1% 83, TLC 4.75 (96%), DLCO 68%  Chest Imaging:  HRCT chest 03/05/21 >> enlarged PA, atherosclerosis, basilar and peripheral GGO with bronchiectasis,  calcified granuloma LLL, air trapping  Cardiac testing:  Echo 03/07/21 >> EF 55 to 60%, mild LVH  Social History:  She  reports that she has never smoked. She has never used smokeless tobacco. She reports that she does not currently use alcohol. She reports that she does not use drugs.  Family History:  Her family history includes Aneurysm in her brother, father, and sister; Celiac disease in her brother, brother, and sister; Congestive Heart Failure in her mother; Irritable bowel syndrome in her sister; Melanoma in her brother; Other in her sister; Stroke in her sister.     Assessment/Plan:   Severe, persistent asthma. - she was not able to tolerate LAMAs - continue breo 100 one puff daily; can increase to breo 200 as needed during allergy seasons - prn albuterol - she has multiple medication sensitivities/allergies; she has been able to tolerate cefuroxime and medrol when she has exacerbations more recently  Interstitial lung disease. - changes consistent with prior COVID 19 infections - she will resume her exercise regimen at her local gym  Grave's disease. - she is followed by Dr. Buddy Duty with Endocrinology  Celiac disease. - followed by Dr. Abbey Chatters with [facility]  Hx of Sjogren's disease, Lupus. - previously followed by rheumatology while living in [place] - she isn't able to travel to [place] or [place] for rheumatology follow up - managed by her PCP  Time Spent Involved in Patient Care on Day of Examination:  26  minutes  Follow up:   Patient Instructions  Follow up in 6 months  Medication List:   Allergies as of 11/20/2021       Reactions   Prednisone Palpitations   Azithromycin    vomiting   Bactrim [sulfamethoxazole-trimethoprim] Nausea And Vomiting   Migraines    Codeine Nausea And Vomiting   Denosumab    Other reaction(s): Unknown   Gluten Meal    Ceiliac disease   Keflex [cephalexin] Nausea And Vomiting   Latex Hives    Levaquin [levofloxacin] Nausea And Vomiting   Sulfa Antibiotics Nausea And Vomiting   Contrast Media [iodinated Contrast Media] Nausea And Vomiting, Rash   Per patient, did fine with premedication with IV steroids before    Doxycycline Palpitations   Made body feel crazy, increased heart rate        Medication List        Accurate as of November 20, 2021  2:04 PM. If you have any questions, ask your nurse or doctor.          STOP taking these medications    cefUROXime 500 MG tablet Commonly known as: CEFTIN Stopped by: Chesley Mires, MD       TAKE these medications    acidophilus Caps capsule Take 1 capsule by mouth daily.   albuterol 108 (90 Base) MCG/ACT inhaler Commonly known as: VENTOLIN HFA Inhale 1-2 puffs into the lungs every 6 (six) hours as needed for wheezing or shortness of breath.   aspirin EC 81 MG tablet Take 1 tablet (81 mg total) by mouth daily. Swallow whole.   atenolol 50 MG tablet Commonly known as: TENORMIN Take 50 mg by mouth 2 (two) times daily.   Breo Ellipta 100-25 MCG/ACT Aepb Generic drug: fluticasone furoate-vilanterol INHALE 1 PUFF BY MOUTH ONCE DAILY . APPOINTMENT REQUIRED FOR FUTURE REFILLS   calcium carbonate 750 MG chewable tablet Commonly known as: TUMS EX Chew 2 tablets by mouth daily.   clonazePAM 0.5 MG tablet Commonly known as: KLONOPIN Take 0.25-0.5 mg by mouth 2 (two) times daily as needed for anxiety.   ELDERBERRY PO Take 1 capsule by mouth daily.   Fish Oil 1000 MG Caps Take 2,000 mg by mouth daily.   linaclotide 145 MCG Caps capsule Commonly known as: Linzess Take 1 capsule (145 mcg total) by mouth daily before breakfast.   ondansetron 4 MG tablet Commonly known as: Zofran Take 1 tablet (4 mg total) by mouth every 8 (eight) hours as needed for nausea or vomiting.   pantoprazole 40 MG tablet Commonly known as: PROTONIX Take 1 tablet (40 mg total) by mouth daily before breakfast.   pilocarpine 5 MG  tablet Commonly known as: SALAGEN Take 5 mg by mouth 3 (three) times daily.   promethazine 12.5 MG tablet Commonly known as: PHENERGAN Take 1 tablet (12.5 mg total) by mouth every 6 (six) hours as needed for nausea or vomiting.   Restasis 0.05 % ophthalmic emulsion Generic drug: cycloSPORINE Place 1 drop into both eyes 2 (two) times daily.   rosuvastatin 5 MG tablet Commonly known as: CRESTOR Take 5 mg by mouth 4 (four) times a week.   Synthroid 112 MCG tablet Generic drug: levothyroxine Take 112 mcg by mouth daily before breakfast.   Vitamin D3 Gummies 25 MCG (1000 UT) Chew Generic drug: Cholecalciferol Chew 6,000 Units by mouth daily.        Signature:  Chesley Mires, MD [place] Pager - [phone] 11/20/2021, 2:04 PM

## 2021-11-20 NOTE — Telephone Encounter (Signed)
Notified patient of Dr. Collie Siad response and she voiced understanding. Nothing further needed.

## 2021-11-20 NOTE — Telephone Encounter (Signed)
She should plan to get her COVID booster when this is available.  The CDC should be making an announcement about this in the next couple of weeks.  She needs to check with her PCP about which pneumonia vaccines she received before and how long ago these were.  This will determine whether she needs another pneumonia shot or if she is done with pneumonia shots.

## 2021-11-25 ENCOUNTER — Ambulatory Visit (HOSPITAL_COMMUNITY): Payer: Medicare Other | Admitting: Physical Therapy

## 2021-12-02 ENCOUNTER — Other Ambulatory Visit: Payer: Medicare Other

## 2021-12-02 ENCOUNTER — Ambulatory Visit: Payer: Medicare Other | Admitting: Women's Health

## 2021-12-03 ENCOUNTER — Encounter: Payer: Self-pay | Admitting: *Deleted

## 2021-12-08 ENCOUNTER — Ambulatory Visit: Payer: Medicare Other | Admitting: Orthopedic Surgery

## 2021-12-10 ENCOUNTER — Ambulatory Visit: Payer: Medicare Other | Admitting: Orthopedic Surgery

## 2021-12-15 ENCOUNTER — Ambulatory Visit: Payer: Medicare Other | Admitting: Gastroenterology

## 2021-12-18 DIAGNOSIS — E782 Mixed hyperlipidemia: Secondary | ICD-10-CM | POA: Diagnosis not present

## 2021-12-18 DIAGNOSIS — E039 Hypothyroidism, unspecified: Secondary | ICD-10-CM | POA: Diagnosis not present

## 2021-12-18 DIAGNOSIS — R7301 Impaired fasting glucose: Secondary | ICD-10-CM | POA: Diagnosis not present

## 2021-12-23 DIAGNOSIS — K900 Celiac disease: Secondary | ICD-10-CM | POA: Diagnosis not present

## 2021-12-23 DIAGNOSIS — M797 Fibromyalgia: Secondary | ICD-10-CM | POA: Diagnosis not present

## 2021-12-23 DIAGNOSIS — K581 Irritable bowel syndrome with constipation: Secondary | ICD-10-CM | POA: Diagnosis not present

## 2021-12-23 DIAGNOSIS — I10 Essential (primary) hypertension: Secondary | ICD-10-CM | POA: Diagnosis not present

## 2021-12-25 ENCOUNTER — Ambulatory Visit (HOSPITAL_COMMUNITY)
Admission: RE | Admit: 2021-12-25 | Discharge: 2021-12-25 | Disposition: A | Discharge status: Home / Self Care | Payer: Medicare Other | Source: Ambulatory Visit | Attending: Family Medicine | Admitting: Family Medicine

## 2021-12-25 DIAGNOSIS — Z1231 Encounter for screening mammogram for malignant neoplasm of breast: Secondary | ICD-10-CM | POA: Insufficient documentation

## 2022-01-08 DIAGNOSIS — D2271 Melanocytic nevi of right lower limb, including hip: Secondary | ICD-10-CM | POA: Diagnosis not present

## 2022-01-08 DIAGNOSIS — D225 Melanocytic nevi of trunk: Secondary | ICD-10-CM | POA: Diagnosis not present

## 2022-01-08 DIAGNOSIS — Z1283 Encounter for screening for malignant neoplasm of skin: Secondary | ICD-10-CM | POA: Diagnosis not present

## 2022-01-08 DIAGNOSIS — D485 Neoplasm of uncertain behavior of skin: Secondary | ICD-10-CM | POA: Diagnosis not present

## 2022-01-12 ENCOUNTER — Telehealth: Payer: Self-pay | Admitting: *Deleted

## 2022-01-12 ENCOUNTER — Telehealth: Payer: Self-pay | Admitting: Pulmonary Disease

## 2022-01-12 MED ORDER — METHYLPREDNISOLONE 4 MG PO TBPK: ORAL_TABLET | ORAL | 0 refills | Status: AC

## 2022-01-12 MED ORDER — CEFUROXIME AXETIL 500 MG PO TABS: 500.0000 mg | ORAL_TABLET | Freq: Two times a day (BID) | ORAL | 0 refills | Status: AC

## 2022-01-12 NOTE — Telephone Encounter (Signed)
Recommend flu and COVID testing.  I sent same regimen to Dr. Halford Chessman in  August 2023.  She has allergies to cephalosporins as well as prednisone.  However she tolerated cefuroxime and methylprednisolone at that time.  Cefuroxime 500 mg twice a day for 7 days  Solu-Medrol Dosepak with taper   Both medications sent to [facility].  If symptoms or not improving in the next 48 hours or worsen any time, recommend she seek care in the emergency room.  Please inform her.  Thank you.

## 2022-01-12 NOTE — Telephone Encounter (Signed)
pharmacy: [facility] in [place].

## 2022-01-12 NOTE — Telephone Encounter (Signed)
Primary Pulmonologist: Dr. Halford Chessman  Last office visit and with whom: Dr. Halford Chessman 11/20/21 What do we see them for (pulmonary problems): severe persistent asthma/ ILD Last OV assessment/plan: see below   Was appointment offered to patient (explain)?  No None available in [facility] office    Reason for call: Patient called and states she has severe chest congestion. Sore throat. Persistent cough but feels like she cant get the phlegm up. Wheezing. Night sweats.Believes she had a fever but is not sure. States she has tried throat lozenges and OTC cough medicine. States she has not had covid or flu tests done. She states this is similar to what she had going on in August when Dr. Halford Chessman sent in [place] for her.   Uses [facility]   Dr. Halford Chessman is out of the office will send to DOD Dr. Silas Flood    Allergies  Allergen Reactions   Prednisone Palpitations   Azithromycin     vomiting   Bactrim [Sulfamethoxazole-Trimethoprim] Nausea And Vomiting    Migraines    Codeine Nausea And Vomiting   Denosumab     Other reaction(s): Unknown   Gluten Meal     Ceiliac disease    Keflex [Cephalexin] Nausea And Vomiting   Latex Hives   Levaquin [Levofloxacin] Nausea And Vomiting   Sulfa Antibiotics Nausea And Vomiting   Contrast Media [Iodinated Contrast Media] Nausea And Vomiting and Rash    Per patient, did fine with premedication with IV steroids before    Doxycycline Palpitations    Made body feel crazy, increased heart rate    Immunization History  Administered Date(s) Administered   Janssen (J&J) SARS-COV-2 Vaccination 06/21/2019   PFIZER(Purple Top)SARS-COV-2 Vaccination 04/22/2020   Unspecified SARS-COV-2 Vaccination 06/24/2019    Assessment/Plan:    Severe, persistent asthma. - she was not able to tolerate LAMAs - continue breo 100 one puff daily; can increase to breo 200 as needed during allergy seasons - prn albuterol - she has multiple medication sensitivities/allergies; she has been able to  tolerate cefuroxime and medrol when she has exacerbations more recently   Interstitial lung disease. - changes consistent with prior COVID 19 infections - she will resume her exercise regimen at her local gym   Grave's disease. - she is followed by Dr. Buddy Duty with Endocrinology   Celiac disease. - followed by Dr. Abbey Chatters with [facility]   Hx of Sjogren's disease, Lupus. - previously followed by rheumatology while living in [place] - she isn't able to travel to [place] or [place] for rheumatology follow up - managed by her PCP   Time Spent Involved in Patient Care on Day of Examination:  26 minutes   Follow up:    Patient Instructions  Follow up in 6 months

## 2022-01-12 NOTE — Telephone Encounter (Signed)
Called and spoke with patient, she states that she has to have a virtual visit with her pcp before she can be tested for Covid, they are the only one's that do rapid tests.  She cannot have her Covid test until tomorrow.  She says she has had false negatives in the past with home covid tests.  She is unable to be covid tested at the [place] office as the [place] there is not in the office this afternoon.  Advised I would attempt to call her PCP office and see what I can do for her and then call her back.  ATC the PCP office and reached a message that they were closed for lunch until 2pm.  Attempted to call again and speak with with the nurse, reached the vm of the front staff.  Did not leave a message.  Called patient back and let her know I was unable to get in touch with her PCP's nurse.  I advised her to let us know if she tests positive for Covid.  She verbalized understanding.

## 2022-01-12 NOTE — Telephone Encounter (Signed)
Spoke with the pt and notified of response per Dr Hunsucker  She verbalized understanding  Nothing further needed 

## 2022-01-13 ENCOUNTER — Telehealth: Payer: Self-pay | Admitting: Pulmonary Disease

## 2022-01-13 DIAGNOSIS — R059 Cough, unspecified: Secondary | ICD-10-CM | POA: Diagnosis not present

## 2022-01-13 DIAGNOSIS — J029 Acute pharyngitis, unspecified: Secondary | ICD-10-CM | POA: Diagnosis not present

## 2022-01-13 DIAGNOSIS — J069 Acute upper respiratory infection, unspecified: Secondary | ICD-10-CM | POA: Diagnosis not present

## 2022-01-14 NOTE — Telephone Encounter (Signed)
Noted. Will add in to new encounter that patient was tested for both if she calls back feeling unwell.

## 2022-01-15 ENCOUNTER — Ambulatory Visit: Payer: Medicare Other | Admitting: Internal Medicine

## 2022-01-19 ENCOUNTER — Ambulatory Visit: Payer: Medicare Other | Admitting: Internal Medicine

## 2022-01-22 ENCOUNTER — Telehealth: Payer: Self-pay | Admitting: Pulmonary Disease

## 2022-01-22 DIAGNOSIS — L98499 Non-pressure chronic ulcer of skin of other sites with unspecified severity: Secondary | ICD-10-CM | POA: Diagnosis not present

## 2022-01-22 DIAGNOSIS — D485 Neoplasm of uncertain behavior of skin: Secondary | ICD-10-CM | POA: Diagnosis not present

## 2022-01-22 MED ORDER — AZITHROMYCIN 250 MG PO TABS: ORAL_TABLET | ORAL | 0 refills | Status: AC

## 2022-01-22 NOTE — Telephone Encounter (Signed)
Patient states she would like a z pak sent in. She states she wants another round of medication but antibiotic has been rough on her stomach.   She states her covid and flu tests were negative but has a lingering cough with some mucus coming up to the back of her throat.  Has a surgery scheduled for her foot today and wants to be able to have medicine called in to start after her surgery.   Pharmacy is [facility]   Dr. Halford Chessman please advise

## 2022-01-22 NOTE — Telephone Encounter (Signed)
Okay to send script for a Zpak.

## 2022-01-22 NOTE — Telephone Encounter (Signed)
Called and notified patient and she voiced understanding.  Z pak sent in to [facility]. Nothing further needed at this time.

## 2022-01-27 ENCOUNTER — Telehealth: Payer: Self-pay | Admitting: Pulmonary Disease

## 2022-01-27 NOTE — Telephone Encounter (Signed)
She can get robitussin over the counter in liquid form.  Okay to write a letter for her gym.

## 2022-01-27 NOTE — Telephone Encounter (Signed)
Patient called and states she is still sick (phone note from 01/22/22. Has finished z pak and states she now can feel sputum coming up to back of her throat and when she spits out sputum it is white. Feels she needs cough syrup that has Guaifenesin to help her get sputum up. She states that mucinex makes her sick so she feels she needs something else to help her get mucus up  Patient is also asking for a note for her gym stating that she is sick and may not be able to return to the gym until January because she sick so that her membership can be put on hold.   Please advise.

## 2022-01-27 NOTE — Telephone Encounter (Signed)
Called and spoke with patient. Offered to make her an acute visit since and she was agreeable to come in this Thursday 11/16 at 9:00 am. Nothing further needed

## 2022-01-29 ENCOUNTER — Ambulatory Visit: Payer: Medicare Other | Admitting: Pulmonary Disease

## 2022-01-29 ENCOUNTER — Encounter: Payer: Self-pay | Admitting: Pulmonary Disease

## 2022-01-29 ENCOUNTER — Ambulatory Visit: Payer: Medicare Other | Admitting: Internal Medicine

## 2022-01-29 VITALS — BP 134/82 | HR 76 | Temp 98.2°F | Ht 63.0 in | Wt 173.6 lb

## 2022-01-29 DIAGNOSIS — R0609 Other forms of dyspnea: Secondary | ICD-10-CM | POA: Diagnosis not present

## 2022-01-29 DIAGNOSIS — E039 Hypothyroidism, unspecified: Secondary | ICD-10-CM | POA: Diagnosis not present

## 2022-01-29 DIAGNOSIS — J4550 Severe persistent asthma, uncomplicated: Secondary | ICD-10-CM | POA: Diagnosis not present

## 2022-01-29 DIAGNOSIS — J209 Acute bronchitis, unspecified: Secondary | ICD-10-CM

## 2022-01-29 NOTE — Patient Instructions (Signed)
Follow up in 3 months

## 2022-01-29 NOTE — Progress Notes (Signed)
Pulmonary, Critical Care, and Sleep Medicine  Chief Complaint  Patient presents with   Follow-up    Sick call on 01/27/22    Constitutional:  BP 134/82   Pulse 76   Temp 98.2 F (36.8 C)   Ht 5' 3"  (1.6 m)   Wt 173 lb 9.6 oz (78.7 kg)   SpO2 96% Comment: ra  BMI 30.75 kg/m   Past Medical History:  Anxiety, Celiac disease, Fibromyalgia, Grave's disease, HLD, Migraine headaches, HTN, IBS, Lupus, Osteoporosis, Raynaud disease, Sjogren's disease, COVID 19 August/September/October 2022  Past Surgical History:  She  has a past surgical history that includes Reduction mammaplasty; Cholecystectomy; Appendectomy; OTHER SURGICAL HISTORY; and Colonoscopy with propofol (N/A, 06/10/2020).  Brief Summary:  Katie Davis is a 66 y.o. female with asthma and history of COVID.      Subjective:   She was feeling well over the Summer and started going to the gym.  She was improving her exercise capacity.  In August she got a respiratory infection.  She improved some with therapy, but then had a relapse in October.  She did two rounds of antibiotic and medrol.  She is feeling better, but her symptoms haven't resolved completely.  She is still having sinus congestion.  She gets coughing spells and brings up clear sputum.  Not having fever, chest pain, or wheeze.  Still feels fatigued.  Physical Exam:   Appearance - well kempt   ENMT - no sinus tenderness, no oral exudate, no LAN, Mallampati 3 airway, no stridor  Respiratory - equal breath sounds bilaterally, no wheezing or rales  CV - s1s2 regular rate and rhythm, no murmurs  Ext - no clubbing, no edema  Skin - no rashes  Psych - normal mood and affect     Pulmonary testing:  PFT 02/27/21 >> FEV1 1.95 (82%), FEV1% 83, TLC 4.75 (96%), DLCO 68%  Chest Imaging:  HRCT chest 03/05/21 >> enlarged PA, atherosclerosis, basilar and peripheral GGO with bronchiectasis, calcified granuloma LLL, air trapping  Cardiac testing:   Echo 03/07/21 >> EF 55 to 60%, mild LVH  Social History:  She  reports that she has never smoked. She has never used smokeless tobacco. She reports that she does not currently use alcohol. She reports that she does not use drugs.  Family History:  Her family history includes Aneurysm in her brother, father, and sister; Celiac disease in her brother, brother, and sister; Congestive Heart Failure in her mother; Irritable bowel syndrome in her sister; Melanoma in her brother; Other in her sister; Stroke in her sister.     Assessment/Plan:   Bronchitis with sinusitis. - slow to resolve, but improving - don't think she needs additional antibiotics or medrol at this time - continue prn robitussin  Severe, persistent asthma. - she was not able to tolerate LAMAs - continue breo 100 one puff daily; can increase to breo 200 as needed during allergy seasons - prn albuterol - she has multiple medication sensitivities/allergies; she has been able to tolerate cefuroxime and medrol when she has exacerbations more recently  Interstitial lung disease. - changes consistent with prior COVID 19 infections - she can resume exercising at the gym once she recovers from current episode of bronchitis  Grave's disease. - she is followed by Dr. Buddy Duty with Endocrinology  Celiac disease. - followed by Dr. Abbey Chatters with [facility]  Hx of Sjogren's disease, Lupus. - previously followed by rheumatology while living in [place] - she isn't able to travel to  [place] or [place] for rheumatology follow up - managed by her PCP  Time Spent Involved in Patient Care on Day of Examination:  36 minutes  Follow up:   Patient Instructions  Follow up in 3 months  Medication List:   Allergies as of 01/29/2022       Reactions   Prednisone Palpitations   Azithromycin    vomiting   Bactrim [sulfamethoxazole-trimethoprim] Nausea And Vomiting   Migraines    Codeine Nausea And Vomiting    Denosumab    Other reaction(s): Unknown   Gluten Meal    Ceiliac disease   Keflex [cephalexin] Nausea And Vomiting   Latex Hives   Levaquin [levofloxacin] Nausea And Vomiting   Sulfa Antibiotics Nausea And Vomiting   Contrast Media [iodinated Contrast Media] Nausea And Vomiting, Rash   Per patient, did fine with premedication with IV steroids before    Doxycycline Palpitations   Made body feel crazy, increased heart rate        Medication List        Accurate as of January 29, 2022 10:32 AM. If you have any questions, ask your nurse or doctor.          acidophilus Caps capsule Take 1 capsule by mouth daily.   albuterol 108 (90 Base) MCG/ACT inhaler Commonly known as: VENTOLIN HFA Inhale 1-2 puffs into the lungs every 6 (six) hours as needed for wheezing or shortness of breath.   aspirin EC 81 MG tablet Take 1 tablet (81 mg total) by mouth daily. Swallow whole.   atenolol 50 MG tablet Commonly known as: TENORMIN Take 50 mg by mouth 2 (two) times daily.   Breo Ellipta 100-25 MCG/ACT Aepb Generic drug: fluticasone furoate-vilanterol INHALE 1 PUFF BY MOUTH ONCE DAILY . APPOINTMENT REQUIRED FOR FUTURE REFILLS   calcium carbonate 750 MG chewable tablet Commonly known as: TUMS EX Chew 2 tablets by mouth daily.   clonazePAM 0.5 MG tablet Commonly known as: KLONOPIN Take 0.25-0.5 mg by mouth 2 (two) times daily as needed for anxiety.   ELDERBERRY PO Take 1 capsule by mouth daily.   Fish Oil 1000 MG Caps Take 2,000 mg by mouth daily.   linaclotide 145 MCG Caps capsule Commonly known as: Linzess Take 1 capsule (145 mcg total) by mouth daily before breakfast.   ondansetron 4 MG tablet Commonly known as: Zofran Take 1 tablet (4 mg total) by mouth every 8 (eight) hours as needed for nausea or vomiting.   pantoprazole 40 MG tablet Commonly known as: PROTONIX Take 1 tablet (40 mg total) by mouth daily before breakfast.   pilocarpine 5 MG tablet Commonly known  as: SALAGEN Take 5 mg by mouth 3 (three) times daily.   promethazine 12.5 MG tablet Commonly known as: PHENERGAN Take 1 tablet (12.5 mg total) by mouth every 6 (six) hours as needed for nausea or vomiting.   Restasis 0.05 % ophthalmic emulsion Generic drug: cycloSPORINE Place 1 drop into both eyes 2 (two) times daily.   rosuvastatin 5 MG tablet Commonly known as: CRESTOR Take 5 mg by mouth 4 (four) times a week.   Synthroid 112 MCG tablet Generic drug: levothyroxine Take 112 mcg by mouth daily before breakfast.   Vitamin D3 Gummies 25 MCG (1000 UT) Chew Generic drug: Cholecalciferol Chew 6,000 Units by mouth daily.        Signature:  Chesley Mires, MD [place] Pager - [phone] 01/29/2022, 10:32 AM

## 2022-02-12 DIAGNOSIS — D485 Neoplasm of uncertain behavior of skin: Secondary | ICD-10-CM | POA: Diagnosis not present

## 2022-02-18 ENCOUNTER — Encounter: Payer: Self-pay | Admitting: Internal Medicine

## 2022-02-18 ENCOUNTER — Encounter: Payer: Self-pay | Admitting: *Deleted

## 2022-02-18 ENCOUNTER — Ambulatory Visit (INDEPENDENT_AMBULATORY_CARE_PROVIDER_SITE_OTHER): Payer: Medicare Other | Admitting: Internal Medicine

## 2022-02-18 DIAGNOSIS — R1084 Generalized abdominal pain: Secondary | ICD-10-CM | POA: Diagnosis not present

## 2022-02-18 DIAGNOSIS — R198 Other specified symptoms and signs involving the digestive system and abdomen: Secondary | ICD-10-CM | POA: Diagnosis not present

## 2022-02-18 DIAGNOSIS — K219 Gastro-esophageal reflux disease without esophagitis: Secondary | ICD-10-CM

## 2022-02-18 DIAGNOSIS — R110 Nausea: Secondary | ICD-10-CM | POA: Diagnosis not present

## 2022-02-18 MED ORDER — LINACLOTIDE 145 MCG PO CAPS: 145.0000 ug | ORAL_CAPSULE | Freq: Every day | ORAL | 3 refills | Status: DC

## 2022-02-18 MED ORDER — PANTOPRAZOLE SODIUM 40 MG PO TBEC: 40.0000 mg | DELAYED_RELEASE_TABLET | Freq: Every day | ORAL | 3 refills | Status: DC

## 2022-02-18 NOTE — Patient Instructions (Addendum)
Continue on pantoprazole daily for your chronic acid reflux.  I sent in a year supply to your pharmacy today.  Continue Linzess for your chronic constipation and IBS.  I sent in a year supply of refills today as well.  We will schedule you for upper endoscopy to evaluate your celiac disease.  Continue to avoid gluten.  Your most recent blood work in July showed negative celiac antibodies which is great.  It was very nice seeing you again today.  I hope you have a fantastic holiday season.  Dr. Abbey Chatters

## 2022-02-18 NOTE — Progress Notes (Signed)
Referring Provider: Valentino Nose, FNP Primary Care Physician:  Valentino Nose, FNP Primary GI:  Dr. Abbey Chatters  Chief Complaint  Patient presents with   Irritable Bowel Syndrome    Follow up on IBS - C, celiac, gerd and nausea. Reports doing about the same and no concerns.     HPI:   Katie Davis is a 66 y.o. female who presents to clinic for follow up visit. She has history of  IBS-C, diverticulosis, celiac disease (biopsy confirmed), chronic nausea, 50% blockage of SMA, previously followed  by vascular prior to moving to [place]. 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  Saw Dr. Donnetta Hutching 11/05/21 who recommended supportive care.   Colonoscopy up-to-date March 2022 revealing diverticulosis and nonbleeding internal hemorrhoids, due for repeat in 2032.   Chronic reflux well controlled on pantoprazole 40 mg daily. No dysphagia/odynophagia. No epigastric pain.   IBS with constipation well controlled on Linzess 145 mcg daily.   Celiac disease controlled, adheres to a strict gluten free diet. TTG 10/13/21 unremarkable.   Was scheduled for EGD August 2023 but cancelled due to being sick with pneumonia.    Past Medical History:  Diagnosis Date   Abnormal glandular Papanicolaou smear of cervix    Anxiety    Asthma    Body mass index 30.0-30.9, adult    Celiac disease    Chronic vascular disorder of intestine (HCC)    Fibromyalgia    Graves disease    Headache    Migrains   Hyperlipidemia    Hypertension    Hypothyroidism    IBS (irritable bowel syndrome) With constipation    ILD (interstitial lung disease) (HCC)    Lupus (systemic lupus erythematosus) ([place])    Obesity, unspecified    Osteoporosis    Raynaud disease    Sjogren's disease ([place])     Past Surgical History:  Procedure Laterality Date   APPENDECTOMY     CHOLECYSTECTOMY     COLONOSCOPY WITH PROPOFOL N/A 06/10/2020    Surgeon: Eloise Harman, DO;  diverticulosis and nonbleeding internal hemorrhoids, due for repeat in 2032.   OTHER SURGICAL HISTORY     biopsies on both feet   REDUCTION MAMMAPLASTY      Current Outpatient Medications  Medication Sig Dispense Refill   acidophilus (RISAQUAD) CAPS capsule Take 1 capsule by mouth daily.     albuterol (VENTOLIN HFA) 108 (90 Base) MCG/ACT inhaler Inhale 1-2 puffs into the lungs every 6 (six) hours as needed for wheezing or shortness of breath.     aspirin EC 81 MG tablet Take 1 tablet (81 mg total) by mouth daily. Swallow whole. 90 tablet 3   atenolol (TENORMIN) 50 MG tablet Take 50 mg by mouth 2 (two) times daily.      BREO ELLIPTA 100-25 MCG/ACT AEPB INHALE 1 PUFF BY MOUTH ONCE DAILY . APPOINTMENT REQUIRED FOR FUTURE REFILLS 60 each 3   calcium carbonate (TUMS EX) 750 MG chewable tablet Chew 2 tablets by mouth daily.     Cholecalciferol (VITAMIN D3 GUMMIES) 25 MCG (1000 UT) CHEW Chew 6,000 Units by mouth daily.     clonazePAM (KLONOPIN) 0.5 MG tablet Take 0.25-0.5 mg by mouth 2 (two) times daily as needed for anxiety.      ELDERBERRY PO Take 1 capsule by mouth daily.     levothyroxine (SYNTHROID) 100 MCG tablet Take 100 mcg by mouth. Takes every  other day. Alternates with 112 mcg     linaclotide (LINZESS) 145 MCG CAPS capsule Take 1 capsule (145 mcg total) by mouth daily before breakfast. 30 capsule 3   Omega-3 Fatty Acids (FISH OIL) 1000 MG CAPS Take 2,000 mg by mouth daily.     ondansetron (ZOFRAN) 4 MG tablet Take 1 tablet (4 mg total) by mouth every 8 (eight) hours as needed for nausea or vomiting. 12 tablet 0   pantoprazole (PROTONIX) 40 MG tablet Take 1 tablet (40 mg total) by mouth daily before breakfast. 30 tablet 3   RESTASIS 0.05 % ophthalmic emulsion Place 1 drop into both eyes 2 (two) times daily.     rosuvastatin (CRESTOR) 5 MG tablet Take 5 mg by mouth 4 (four) times a week.     SYNTHROID 112 MCG tablet Take 112 mcg by mouth. Alternates every  other day with 100 mcg     No current facility-administered medications for this visit.    Allergies as of 02/18/2022 - Review Complete 02/18/2022  Allergen Reaction Noted   Prednisone Palpitations 12/17/2020   Azithromycin  02/21/2021   Bactrim [sulfamethoxazole-trimethoprim] Nausea And Vomiting 11/16/2018   Codeine Nausea And Vomiting 02/21/2021   Denosumab  02/21/2021   Gluten meal  11/16/2018   Keflex [cephalexin] Nausea And Vomiting 12/09/2018   Latex Hives 11/16/2018   Levaquin [levofloxacin] Nausea And Vomiting 11/16/2018   Sulfa antibiotics Nausea And Vomiting 11/16/2018   Contrast media [iodinated contrast media] Nausea And Vomiting and Rash 11/16/2018   Doxycycline Palpitations 05/07/2020    Family History  Problem Relation Age of Onset   Congestive Heart Failure Mother    Aneurysm Father    Celiac disease Brother    Melanoma Brother    Aneurysm Brother        stomach   Stroke Sister    Celiac disease Sister    Irritable bowel syndrome Sister    Celiac disease Brother    Aneurysm Sister    Other Sister        multiple back surgeries   Colon cancer Neg Hx     Social History   Socioeconomic History   Marital status: Married    Spouse name: Not on file   Number of children: Not on file   Years of education: Not on file   Highest education level: Not on file  Occupational History   Not on file  Tobacco Use   Smoking status: Never    Passive exposure: Never   Smokeless tobacco: Never  Vaping Use   Vaping Use: Never used  Substance and Sexual Activity   Alcohol use: Not Currently    Comment: wine occ   Drug use: Never   Sexual activity: Not Currently    Birth control/protection: Post-menopausal  Other Topics Concern   Not on file  Social History Narrative   Not on file   Social Determinants of Health   Financial Resource Strain: Low Risk  (05/14/2021)   Overall Financial Resource Strain (CARDIA)    Difficulty of Paying Living Expenses: Not hard  at all  Food Insecurity: No Food Insecurity (05/14/2021)   Hunger Vital Sign    Worried About Running Out of Food in the Last Year: Never true    Ran Out of Food in the Last Year: Never true  Transportation Needs: No Transportation Needs (05/14/2021)   PRAPARE - Hydrologist (Medical): No    Lack of Transportation (Non-Medical): No  Physical Activity: Insufficiently  Active (05/14/2021)   Exercise Vital Sign    Days of Exercise per Week: 2 days    Minutes of Exercise per Session: 50 min  Stress: Stress Concern Present (05/14/2021)   [place]    Feeling of Stress : To some extent  Social Connections: Moderately Integrated (05/14/2021)   Social Connection and Isolation Panel [NHANES]    Frequency of Communication with Friends and Family: More than three times a week    Frequency of Social Gatherings with Friends and Family: Once a week    Attends Religious Services: More than 4 times per year    Active Member of [employer] or Organizations: No    Attends Archivist Meetings: Never    Marital Status: Married    Subjective: Review of Systems  Constitutional:  Negative for chills and fever.  HENT:  Negative for congestion and hearing loss.   Eyes:  Negative for blurred vision and double vision.  Respiratory:  Negative for cough and shortness of breath.   Cardiovascular:  Negative for chest pain and palpitations.  Gastrointestinal:  Negative for abdominal pain, blood in stool, constipation, diarrhea, heartburn, melena and vomiting.  Genitourinary:  Negative for dysuria and urgency.  Musculoskeletal:  Negative for joint pain and myalgias.  Skin:  Negative for itching and rash.  Neurological:  Negative for dizziness and headaches.  Psychiatric/Behavioral:  Negative for depression. The patient is not nervous/anxious.      Objective: BP 112/76 (BP Location: Left Arm, Patient Position: Sitting,  Cuff Size: Large)   Pulse 66   Ht 5' 3"  (1.6 m)   Wt 173 lb 12.8 oz (78.8 kg)   BMI 30.79 kg/m  Physical Exam Constitutional:      Appearance: Normal appearance.  HENT:     Head: Normocephalic and atraumatic.  Eyes:     Extraocular Movements: Extraocular movements intact.     Conjunctiva/sclera: Conjunctivae normal.  Cardiovascular:     Rate and Rhythm: Normal rate and regular rhythm.  Pulmonary:     Effort: Pulmonary effort is normal.     Breath sounds: Normal breath sounds.  Abdominal:     General: Bowel sounds are normal.     Palpations: Abdomen is soft.  Musculoskeletal:        General: No swelling. Normal range of motion.     Cervical back: Normal range of motion and neck supple.  Skin:    General: Skin is warm and dry.     Coloration: Skin is not jaundiced.  Neurological:     General: No focal deficit present.     Mental Status: She is alert and oriented to person, place, and time.  Psychiatric:        Mood and Affect: Mood normal.        Behavior: Behavior normal.      Assessment: *Chronic GERD *Celiac disease *IBS with constipation *Abdominal bloating  *Abdominal pain-improved  Plan: Chronic GERD well controlled on pantoprazole daily. Will continue.  Continue gluten free diet in regards to celiac disease. TTG levels normal 10/13/21.  Will schedule for EGD for surveillance as it has been 7 years. The risks including infection, bleed, or perforation as well as benefits, limitations, alternatives and imponderables have been reviewed with the patient. Potential for esophageal dilation, biopsy, etc. have also been reviewed.  Questions have been answered. All parties agreeable.  IBS C well controlled on Linzess, we will continue.   Follow up after procedure.  02/18/2022 11:21 AM   Disclaimer: This note was dictated with voice recognition software. Similar sounding words can inadvertently be transcribed and may not be corrected upon review.

## 2022-02-19 ENCOUNTER — Encounter: Payer: Self-pay | Admitting: *Deleted

## 2022-02-19 ENCOUNTER — Encounter: Payer: Self-pay | Admitting: Internal Medicine

## 2022-02-19 ENCOUNTER — Ambulatory Visit (INDEPENDENT_AMBULATORY_CARE_PROVIDER_SITE_OTHER): Payer: Medicare Other | Admitting: Internal Medicine

## 2022-02-19 VITALS — BP 111/70 | HR 68 | Ht 63.0 in | Wt 173.4 lb

## 2022-02-19 DIAGNOSIS — Z0001 Encounter for general adult medical examination with abnormal findings: Secondary | ICD-10-CM | POA: Diagnosis not present

## 2022-02-19 DIAGNOSIS — M329 Systemic lupus erythematosus, unspecified: Secondary | ICD-10-CM

## 2022-02-19 DIAGNOSIS — K219 Gastro-esophageal reflux disease without esophagitis: Secondary | ICD-10-CM

## 2022-02-19 DIAGNOSIS — Z8679 Personal history of other diseases of the circulatory system: Secondary | ICD-10-CM

## 2022-02-19 DIAGNOSIS — E119 Type 2 diabetes mellitus without complications: Secondary | ICD-10-CM | POA: Diagnosis not present

## 2022-02-19 DIAGNOSIS — K581 Irritable bowel syndrome with constipation: Secondary | ICD-10-CM

## 2022-02-19 DIAGNOSIS — M3500 Sicca syndrome, unspecified: Secondary | ICD-10-CM

## 2022-02-19 DIAGNOSIS — M810 Age-related osteoporosis without current pathological fracture: Secondary | ICD-10-CM

## 2022-02-19 DIAGNOSIS — E1169 Type 2 diabetes mellitus with other specified complication: Secondary | ICD-10-CM

## 2022-02-19 DIAGNOSIS — K900 Celiac disease: Secondary | ICD-10-CM

## 2022-02-19 DIAGNOSIS — E039 Hypothyroidism, unspecified: Secondary | ICD-10-CM | POA: Diagnosis not present

## 2022-02-19 DIAGNOSIS — E785 Hyperlipidemia, unspecified: Secondary | ICD-10-CM | POA: Diagnosis not present

## 2022-02-19 DIAGNOSIS — J4550 Severe persistent asthma, uncomplicated: Secondary | ICD-10-CM

## 2022-02-19 NOTE — Patient Instructions (Signed)
It was a pleasure to see you today.  Thank you for giving [place] the opportunity to be involved in your care.  Below is a brief recap of your visit and next steps.  We will plan to see you again in 6 weeks.  Summary You have established care today We will check labs in early January and plan for follow up in 6 weeks.

## 2022-02-19 NOTE — Progress Notes (Signed)
New Patient Office Visit  Subjective    Patient ID: Katie Davis, female    DOB: 10-29-1955  Age: 66 y.o. MRN: [id number]  CC:  Chief Complaint  Patient presents with   Establish Care   HPI Katie Davis presents to establish care.  She is a 66 year old woman who endorses an extensive past medical history notable for secondary hypothyroidism, Graves' disease, IBS, celiac disease, celiac artery stenosis, IBS, Sjogren's syndrome, Raynaud's disease, SLE, osteoporosis, hyperlipidemia, and type 2 diabetes mellitus.  She was previously followed by Valentino Nose, NP at [facility]. Katie Davis reports feeling well today.  She has no specific concerns to discuss today aside from wanting to establish care.  We have reviewed her medical history today and will update outstanding preventative care items.  Outpatient Encounter Medications as of 02/19/2022  Medication Sig   acidophilus (RISAQUAD) CAPS capsule Take 1 capsule by mouth daily.   albuterol (VENTOLIN HFA) 108 (90 Base) MCG/ACT inhaler Inhale 1-2 puffs into the lungs every 6 (six) hours as needed for wheezing or shortness of breath.   aspirin EC 81 MG tablet Take 1 tablet (81 mg total) by mouth daily. Swallow whole.   atenolol (TENORMIN) 50 MG tablet Take 50 mg by mouth 2 (two) times daily.    calcium carbonate (TUMS EX) 750 MG chewable tablet Chew 2 tablets by mouth daily.   Cholecalciferol (VITAMIN D3 GUMMIES) 25 MCG (1000 UT) CHEW Chew 6,000 Units by mouth daily.   clonazePAM (KLONOPIN) 0.5 MG tablet Take 0.25-0.5 mg by mouth 2 (two) times daily as needed for anxiety.    ELDERBERRY PO Take 1 capsule by mouth daily.   levothyroxine (SYNTHROID) 100 MCG tablet Take 100 mcg by mouth. Takes every other day. Alternates with 112 mcg   linaclotide (LINZESS) 145 MCG CAPS capsule Take 1 capsule (145 mcg total) by mouth daily before breakfast.   Omega-3 Fatty Acids (FISH OIL) 1000 MG CAPS Take 2,000 mg by mouth daily.   ondansetron (ZOFRAN) 4 MG  tablet Take 1 tablet (4 mg total) by mouth every 8 (eight) hours as needed for nausea or vomiting.   pantoprazole (PROTONIX) 40 MG tablet Take 1 tablet (40 mg total) by mouth daily before breakfast.   RESTASIS 0.05 % ophthalmic emulsion Place 1 drop into both eyes 2 (two) times daily.   rosuvastatin (CRESTOR) 5 MG tablet Take 5 mg by mouth 4 (four) times a week.   SYNTHROID 112 MCG tablet Take 112 mcg by mouth. Alternates every other day with 100 mcg   [DISCONTINUED] BREO ELLIPTA 100-25 MCG/ACT AEPB INHALE 1 PUFF BY MOUTH ONCE DAILY . APPOINTMENT REQUIRED FOR FUTURE REFILLS   No facility-administered encounter medications on file as of 02/19/2022.    Past Medical History:  Diagnosis Date   Abnormal glandular Papanicolaou smear of cervix    Allergy 2008   Latex ..after surgery   Anxiety    Asthma    Body mass index 30.0-30.9, adult    Celiac disease    Chronic vascular disorder of intestine (HCC)    Fibromyalgia    GERD (gastroesophageal reflux disease) 14 years ago   Graves disease    Headache    Migrains   Hyperlipidemia    Hypertension    Hypothyroidism    IBS (irritable bowel syndrome) With constipation    ILD (interstitial lung disease) (HCC)    Lupus (systemic lupus erythematosus) (HCC)    Obesity, unspecified    Osteoporosis    Raynaud disease  Sjogren's disease ([place])    Type 2 diabetes mellitus without complications ([place]) 82/42/3536    Past Surgical History:  Procedure Laterality Date   APPENDECTOMY     CHOLECYSTECTOMY     COLONOSCOPY WITH PROPOFOL N/A 06/10/2020   Surgeon: Eloise Harman, DO;  diverticulosis and nonbleeding internal hemorrhoids, due for repeat in 2032.   EYE SURGERY  2019   Cataract. Right eye   OTHER SURGICAL HISTORY     biopsies on both feet   REDUCTION MAMMAPLASTY      Family History  Problem Relation Age of Onset   Congestive Heart Failure Mother    Arthritis Mother    Aneurysm Father    Celiac disease Brother    Melanoma  Brother    Aneurysm Brother        stomach   Asthma Brother    Cancer Brother    Stroke Sister    Celiac disease Sister    Irritable bowel syndrome Sister    Cancer Sister    Celiac disease Brother    Aneurysm Sister    Other Sister        multiple back surgeries   Colon cancer Neg Hx     Social History   Socioeconomic History   Marital status: Married    Spouse name: Not on file   Number of children: Not on file   Years of education: Not on file   Highest education level: Not on file  Occupational History   Not on file  Tobacco Use   Smoking status: Never    Passive exposure: Never   Smokeless tobacco: Never  Vaping Use   Vaping Use: Never used  Substance and Sexual Activity   Alcohol use: Not Currently    Comment: wine occ   Drug use: Never   Sexual activity: Not Currently    Birth control/protection: Abstinence, Post-menopausal, None  Other Topics Concern   Not on file  Social History Narrative   Not on file   Social Determinants of Health   Financial Resource Strain: Low Risk  (05/14/2021)   Overall Financial Resource Strain (CARDIA)    Difficulty of Paying Living Expenses: Not hard at all  Food Insecurity: No Food Insecurity (05/14/2021)   Hunger Vital Sign    Worried About Running Out of Food in the Last Year: Never true    Ran Out of Food in the Last Year: Never true  Transportation Needs: No Transportation Needs (05/14/2021)   PRAPARE - Hydrologist (Medical): No    Lack of Transportation (Non-Medical): No  Physical Activity: Insufficiently Active (05/14/2021)   Exercise Vital Sign    Days of Exercise per Week: 2 days    Minutes of Exercise per Session: 50 min  Stress: Stress Concern Present (05/14/2021)   [place]    Feeling of Stress : To some extent  Social Connections: Moderately Integrated (05/14/2021)   Social Connection and Isolation Panel [NHANES]     Frequency of Communication with Friends and Family: More than three times a week    Frequency of Social Gatherings with Friends and Family: Once a week    Attends Religious Services: More than 4 times per year    Active Member of [employer] or Organizations: No    Attends Archivist Meetings: Never    Marital Status: Married  Human resources officer Violence: Not At Risk (05/14/2021)   Humiliation, Afraid, Rape, and Kick questionnaire  Fear of Current or Ex-Partner: No    Emotionally Abused: No    Physically Abused: No    Sexually Abused: No   Review of Systems  Constitutional:  Negative for chills and fever.  HENT:  Negative for sore throat.   Respiratory:  Negative for cough and shortness of breath.   Cardiovascular:  Negative for chest pain, palpitations and leg swelling.  Gastrointestinal:  Negative for abdominal pain, blood in stool, constipation, diarrhea, nausea and vomiting.  Genitourinary:  Negative for dysuria and hematuria.  Musculoskeletal:  Negative for myalgias.  Skin:  Negative for itching and rash.  Neurological:  Negative for dizziness and headaches.  Psychiatric/Behavioral:  Negative for depression and suicidal ideas.     Objective    BP 111/70   Pulse 68   Ht 5' 3"  (1.6 m)   Wt 173 lb 6.4 oz (78.7 kg)   SpO2 95%   BMI 30.72 kg/m   Physical Exam Vitals reviewed.  Constitutional:      General: She is not in acute distress.    Appearance: Normal appearance. She is not toxic-appearing.  HENT:     Head: Normocephalic and atraumatic.     Right Ear: External ear normal.     Left Ear: External ear normal.     Nose: Nose normal. No congestion or rhinorrhea.     Mouth/Throat:     Mouth: Mucous membranes are moist.     Pharynx: Oropharynx is clear. No oropharyngeal exudate or posterior oropharyngeal erythema.  Eyes:     General: No scleral icterus.    Extraocular Movements: Extraocular movements intact.     Conjunctiva/sclera: Conjunctivae normal.      Pupils: Pupils are equal, round, and reactive to light.  Cardiovascular:     Rate and Rhythm: Normal rate and regular rhythm.     Pulses: Normal pulses.     Heart sounds: Normal heart sounds. No murmur heard.    No friction rub. No gallop.  Pulmonary:     Effort: Pulmonary effort is normal.     Breath sounds: Normal breath sounds. No wheezing, rhonchi or rales.  Abdominal:     General: Abdomen is flat. Bowel sounds are normal. There is no distension.     Palpations: Abdomen is soft.     Tenderness: There is no abdominal tenderness.  Musculoskeletal:        General: No swelling. Normal range of motion.     Cervical back: Normal range of motion.     Right lower leg: No edema.     Left lower leg: No edema.  Lymphadenopathy:     Cervical: No cervical adenopathy.  Skin:    General: Skin is warm and dry.     Capillary Refill: Capillary refill takes less than 2 seconds.     Coloration: Skin is not jaundiced.     Comments: Band-Aid covering site of recent skin biopsy on dorsum of right foot.  Neurological:     General: No focal deficit present.     Mental Status: She is alert and oriented to person, place, and time.  Psychiatric:        Mood and Affect: Mood normal.        Behavior: Behavior normal.    Assessment & Plan:   Problem List Items Addressed This Visit       Severe persistent asthma    Followed by pulmonology (Dr. Halford Chessman).  She is currently prescribed Breo Ellipta for daily use and albuterol for as needed  use.  Unremarkable pulmonary exam today.      Celiac disease    Symptoms well-controlled currently. Followed by GI (Dr. Abbey Chatters).  Last seen yesterday (12/6).  She will undergo repeat EGD in 1 month.      Gastroesophageal reflux disease    Symptoms currently well-controlled with Protonix 40 mg daily.      IBS (irritable bowel syndrome)    IBS-C.  Symptoms controlled with use of Linzess.  Followed by GI.      Type 2 diabetes mellitus without complications (HCC)     Diet controlled.  Not currently on any medication for treatment of diabetes.  Repeat A1c ordered today. -We will address outstanding diabetes related preventative care items at follow-up      Hyperlipidemia associated with type 2 diabetes mellitus ([place])    Currently prescribed rosuvastatin 5 mg 4 times weekly and fish oil supplementation.  Repeat lipid panel ordered today.      Hypothyroidism    Followed by endocrinology (Dr. Buddy Duty).  Previous history of Graves' disease s/p radioactive iodine ablation.  She is currently prescribed levothyroxine in alternating doses of 112 and 100 mcg.  Repeat TSH/T4 ordered today      Osteoporosis    DEXA scan updated in August.  T-score -2.9.  She is not currently on any specific therapy for treatment of osteoporosis.  Baseline labs ordered today.      Encounter for general adult medical examination with abnormal findings - Primary    Presenting today to establish care.  Previous available records and labs have been reviewed. -Baseline labs ordered today -Follow-up in January to review lab results and address outstanding preventative care items      History of Raynaud's syndrome    Asymptomatic currently.  She is not on any specific treatment for Raynaud's.      History of Sjogren's disease ([place])    She endorses a history of Sjogren syndrome.  Not on any specific medication currently.  Previously followed by rheumatology in [place], but this has most recently been managed by her PCP.  No changes today.      History of systemic lupus erythematosus (SLE) ([place])    She endorses a history of lupus today.  She is not on any specific medication for treatment of SLE.  Previously followed by rheumatology in [place], but this has most recently been managed by her PCP.  Baseline labs ordered today.       Return in about 6 weeks (around 04/02/2022).   Johnette Abraham, MD

## 2022-02-22 ENCOUNTER — Other Ambulatory Visit: Payer: Self-pay | Admitting: Pulmonary Disease

## 2022-02-25 ENCOUNTER — Encounter: Payer: Self-pay | Admitting: Internal Medicine

## 2022-02-25 DIAGNOSIS — M810 Age-related osteoporosis without current pathological fracture: Secondary | ICD-10-CM | POA: Insufficient documentation

## 2022-02-25 DIAGNOSIS — E119 Type 2 diabetes mellitus without complications: Secondary | ICD-10-CM | POA: Insufficient documentation

## 2022-02-25 DIAGNOSIS — K589 Irritable bowel syndrome without diarrhea: Secondary | ICD-10-CM | POA: Insufficient documentation

## 2022-02-25 DIAGNOSIS — M3500 Sicca syndrome, unspecified: Secondary | ICD-10-CM | POA: Insufficient documentation

## 2022-02-25 DIAGNOSIS — Z8679 Personal history of other diseases of the circulatory system: Secondary | ICD-10-CM | POA: Insufficient documentation

## 2022-02-25 DIAGNOSIS — J4550 Severe persistent asthma, uncomplicated: Secondary | ICD-10-CM | POA: Insufficient documentation

## 2022-02-25 DIAGNOSIS — M329 Systemic lupus erythematosus, unspecified: Secondary | ICD-10-CM | POA: Insufficient documentation

## 2022-02-25 DIAGNOSIS — E039 Hypothyroidism, unspecified: Secondary | ICD-10-CM | POA: Insufficient documentation

## 2022-02-25 DIAGNOSIS — R7303 Prediabetes: Secondary | ICD-10-CM | POA: Insufficient documentation

## 2022-02-25 DIAGNOSIS — E785 Hyperlipidemia, unspecified: Secondary | ICD-10-CM | POA: Insufficient documentation

## 2022-02-25 HISTORY — DX: Type 2 diabetes mellitus without complications: E11.9

## 2022-02-25 NOTE — Assessment & Plan Note (Signed)
DEXA scan updated in August.  T-score -2.9.  She is not currently on any specific therapy for treatment of osteoporosis.  Baseline labs ordered today.

## 2022-02-25 NOTE — Assessment & Plan Note (Signed)
IBS-C.  Symptoms controlled with use of Linzess.  Followed by GI.

## 2022-02-25 NOTE — Assessment & Plan Note (Addendum)
Followed by endocrinology (Dr. Buddy Duty).  Previous history of Graves' disease s/p radioactive iodine ablation.  She is currently prescribed levothyroxine in alternating doses of 112 and 100 mcg.  Repeat TSH/T4 ordered today

## 2022-02-25 NOTE — Assessment & Plan Note (Addendum)
Symptoms well-controlled currently. Followed by GI (Dr. Abbey Chatters).  Last seen yesterday (12/6).  She will undergo repeat EGD in 1 month.

## 2022-02-25 NOTE — Assessment & Plan Note (Signed)
Diet controlled.  Not currently on any medication for treatment of diabetes.  Repeat A1c ordered today. -We will address outstanding diabetes related preventative care items at follow-up

## 2022-02-25 NOTE — Assessment & Plan Note (Signed)
Asymptomatic currently.  She is not on any specific treatment for Raynaud's.

## 2022-02-25 NOTE — Assessment & Plan Note (Signed)
She endorses a history of Sjogren syndrome.  Not on any specific medication currently.  Previously followed by rheumatology in [place], but this has most recently been managed by her PCP.  No changes today.

## 2022-02-25 NOTE — Assessment & Plan Note (Signed)
Currently prescribed rosuvastatin 5 mg 4 times weekly and fish oil supplementation.  Repeat lipid panel ordered today.

## 2022-02-25 NOTE — Assessment & Plan Note (Signed)
She endorses a history of lupus today.  She is not on any specific medication for treatment of SLE.  Previously followed by rheumatology in [place], but this has most recently been managed by her PCP.  Baseline labs ordered today.

## 2022-02-25 NOTE — Assessment & Plan Note (Signed)
Presenting today to establish care.  Previous available records and labs have been reviewed. -Baseline labs ordered today -Follow-up in January to review lab results and address outstanding preventative care items

## 2022-02-25 NOTE — Assessment & Plan Note (Signed)
Symptoms currently well-controlled with Protonix 40 mg daily.

## 2022-02-25 NOTE — Assessment & Plan Note (Signed)
Followed by pulmonology (Dr. Halford Chessman).  She is currently prescribed Breo Ellipta for daily use and albuterol for as needed use.  Unremarkable pulmonary exam today.

## 2022-02-26 ENCOUNTER — Telehealth: Payer: Self-pay | Admitting: Internal Medicine

## 2022-02-26 ENCOUNTER — Other Ambulatory Visit: Payer: Self-pay

## 2022-02-26 DIAGNOSIS — E119 Type 2 diabetes mellitus without complications: Secondary | ICD-10-CM

## 2022-02-26 NOTE — Telephone Encounter (Signed)
Pt wants to know if she can please get a referral for a nutritionist? Prefers [facility]   Wants to go for general nutrition to make sure she is doing right with her dietary restrictions

## 2022-02-26 NOTE — Telephone Encounter (Signed)
Returned call and placed order.

## 2022-02-26 NOTE — Telephone Encounter (Signed)
Pt called back stating she forgot to mention she wanted to have some blood work ordered? Can you please call her back when available?

## 2022-03-03 ENCOUNTER — Other Ambulatory Visit: Payer: Self-pay

## 2022-03-03 ENCOUNTER — Telehealth: Payer: Self-pay | Admitting: Internal Medicine

## 2022-03-03 DIAGNOSIS — Z8679 Personal history of other diseases of the circulatory system: Secondary | ICD-10-CM

## 2022-03-03 DIAGNOSIS — M3500 Sicca syndrome, unspecified: Secondary | ICD-10-CM

## 2022-03-03 DIAGNOSIS — M329 Systemic lupus erythematosus, unspecified: Secondary | ICD-10-CM

## 2022-03-03 NOTE — Telephone Encounter (Signed)
Patient called back to let nurse know to go ahead and put the referral in that was discuss with provider, will need the approval for referral to be sent call back # [phone].

## 2022-03-03 NOTE — Telephone Encounter (Signed)
Referral placed.

## 2022-03-13 ENCOUNTER — Telehealth: Payer: Self-pay | Admitting: Internal Medicine

## 2022-03-13 NOTE — Telephone Encounter (Signed)
Patient called in regard to lab orders wants a call back to ensure allorders  Are in at  the same time

## 2022-03-13 NOTE — Telephone Encounter (Signed)
Spoke with patient.

## 2022-03-17 NOTE — Patient Instructions (Signed)
Katie Davis  03/17/2022     @PREFPERIOPPHARMACY @   Your procedure is scheduled on 03/23/2022.   Report to [facility] at  [phone]  A.M.   Call this number if you have problems the morning of surgery:  [phone]  If you experience any cold or flu symptoms such as cough, fever, chills, shortness of breath, etc. between now and your scheduled surgery, please notify us at the above number.   Remember:  Follow the diet instructions given to you by the office.     Take these medicines the morning of surgery with A SIP OF WATER             atenolol, clonazepam, levothyroxine or synthroid,zofran, pantoprazole.     Do not wear jewelry, make-up or nail polish.  Do not wear lotions, powders, or perfumes, or deodorant.  Do not shave 48 hours prior to surgery.  Men may shave face and neck.  Do not bring valuables to the hospital.  [facility] is not responsible for any belongings or valuables.  Contacts, dentures or bridgework may not be worn into surgery.  Leave your suitcase in the car.  After surgery it may be brought to your room.  For patients admitted to the hospital, discharge time will be determined by your treatment team.  Patients discharged the day of surgery will not be allowed to drive home and must have someone with them for 24 hours.    Special instructions:   DO NOT smoke tobacco or vape for 24 hours before your procedure.  Please read over the following fact sheets that you were given. Anesthesia Post-op Instructions and Care and Recovery After Surgery       Upper Endoscopy, Adult, Care After After the procedure, it is common to have a sore throat. It is also common to have: Mild stomach pain or discomfort. Bloating. Nausea. Follow these instructions at home: The instructions below may help you care for yourself at home. Your health care provider may give you more instructions. If you have questions, ask your health care provider. If you were given a  sedative during the procedure, it can affect you for several hours. Do not drive or operate machinery until your health care provider says that it is safe. If you will be going home right after the procedure, plan to have a responsible adult: Take you home from the hospital or clinic. You will not be allowed to drive. Care for you for the time you are told. Follow instructions from your health care provider about what you may eat and drink. Return to your normal activities as told by your health care provider. Ask your health care provider what activities are safe for you. Take over-the-counter and prescription medicines only as told by your health care provider. Contact a health care provider if you: Have a sore throat that lasts longer than one day. Have trouble swallowing. Have a fever. Get help right away if you: Vomit blood or your vomit looks like coffee grounds. Have bloody, black, or tarry stools. Have a very bad sore throat or you cannot swallow. Have difficulty breathing or very bad pain in your chest or abdomen. These symptoms may be an emergency. Get help right away. Call 911. Do not wait to see if the symptoms will go away. Do not drive yourself to the hospital. Summary After the procedure, it is common to have a sore throat, mild stomach discomfort, bloating, and nausea. If you were  given a sedative during the procedure, it can affect you for several hours. Do not drive until your health care provider says that it is safe. Follow instructions from your health care provider about what you may eat and drink. Return to your normal activities as told by your health care provider. This information is not intended to replace advice given to you by your health care provider. Make sure you discuss any questions you have with your health care provider. Document Revised: 06/11/2021 Document Reviewed: 06/11/2021 Elsevier Patient Education  [place] After The following information offers guidance on how to care for yourself after your procedure. Your health care provider may also give you more specific instructions. If you have problems or questions, contact your health care provider. What can I expect after the procedure? After the procedure, it is common to have: Tiredness. Little or no memory about what happened during or after the procedure. Impaired judgment when it comes to making decisions. Nausea or vomiting. Some trouble with balance. Follow these instructions at home: For the time period you were told by your health care provider:  Rest. Do not participate in activities where you could fall or become injured. Do not drive or use machinery. Do not drink alcohol. Do not take sleeping pills or medicines that cause drowsiness. Do not make important decisions or sign legal documents. Do not take care of children on your own. Medicines Take over-the-counter and prescription medicines only as told by your health care provider. If you were prescribed antibiotics, take them as told by your health care provider. Do not stop using the antibiotic even if you start to feel better. Eating and drinking Follow instructions from your health care provider about what you may eat and drink. Drink enough fluid to keep your urine pale yellow. If you vomit: Drink clear fluids slowly and in small amounts as you are able. Clear fluids include water, ice chips, low-calorie sports drinks, and fruit juice that has water added to it (diluted fruit juice). Eat light and bland foods in small amounts as you are able. These foods include bananas, applesauce, rice, lean meats, toast, and crackers. General instructions  Have a responsible adult stay with you for the time you are told. It is important to have someone help care for you until you are awake and alert. If you have sleep apnea, surgery and some medicines can increase your risk for  breathing problems. Follow instructions from your health care provider about wearing your sleep device: When you are sleeping. This includes during daytime naps. While taking prescription pain medicines, sleeping medicines, or medicines that make you drowsy. Do not use any products that contain nicotine or tobacco. These products include cigarettes, chewing tobacco, and vaping devices, such as e-cigarettes. If you need help quitting, ask your health care provider. Contact a health care provider if: You feel nauseous or vomit every time you eat or drink. You feel light-headed. You are still sleepy or having trouble with balance after 24 hours. You get a rash. You have a fever. You have redness or swelling around the IV site. Get help right away if: You have trouble breathing. You have new confusion after you get home. These symptoms may be an emergency. Get help right away. Call 911. Do not wait to see if the symptoms will go away. Do not drive yourself to the hospital. This information is not intended to replace advice given to you by your health care  provider. Make sure you discuss any questions you have with your health care provider. Document Revised: 07/28/2021 Document Reviewed: 07/28/2021 Elsevier Patient Education  [place].

## 2022-03-18 ENCOUNTER — Encounter (HOSPITAL_COMMUNITY)
Admission: RE | Admit: 2022-03-18 | Discharge: 2022-03-18 | Disposition: A | Discharge status: Home / Self Care | Payer: BLUE CROSS/BLUE SHIELD | Source: Ambulatory Visit | Attending: Internal Medicine | Admitting: Internal Medicine

## 2022-03-18 ENCOUNTER — Other Ambulatory Visit: Payer: Self-pay

## 2022-03-18 ENCOUNTER — Telehealth: Payer: Self-pay | Admitting: Internal Medicine

## 2022-03-18 ENCOUNTER — Encounter (HOSPITAL_COMMUNITY): Payer: Self-pay

## 2022-03-18 VITALS — Ht 63.0 in | Wt 170.0 lb

## 2022-03-18 DIAGNOSIS — Z794 Long term (current) use of insulin: Secondary | ICD-10-CM

## 2022-03-18 DIAGNOSIS — E119 Type 2 diabetes mellitus without complications: Secondary | ICD-10-CM

## 2022-03-18 DIAGNOSIS — E1169 Type 2 diabetes mellitus with other specified complication: Secondary | ICD-10-CM

## 2022-03-18 DIAGNOSIS — E785 Hyperlipidemia, unspecified: Secondary | ICD-10-CM | POA: Diagnosis not present

## 2022-03-18 DIAGNOSIS — E039 Hypothyroidism, unspecified: Secondary | ICD-10-CM

## 2022-03-18 NOTE — Telephone Encounter (Signed)
Ref sent to Dr Benjamine Mola

## 2022-03-18 NOTE — Telephone Encounter (Signed)
Dr Amil Amen was cancel needs a [facility] referral.  Needs a new referral to [facility] needs a new referral fax #  [phone]. please contact patient once referral out.

## 2022-03-19 ENCOUNTER — Telehealth: Payer: Self-pay | Admitting: *Deleted

## 2022-03-19 ENCOUNTER — Encounter: Payer: Medicare Other | Admitting: Nutrition

## 2022-03-19 ENCOUNTER — Encounter: Payer: Self-pay | Admitting: Internal Medicine

## 2022-03-19 ENCOUNTER — Other Ambulatory Visit: Payer: Self-pay | Admitting: Emergency Medicine

## 2022-03-19 ENCOUNTER — Telehealth: Payer: Self-pay | Admitting: Internal Medicine

## 2022-03-19 ENCOUNTER — Other Ambulatory Visit: Payer: Self-pay | Admitting: Internal Medicine

## 2022-03-19 LAB — CMP14+EGFR
ALT: 15 IU/L (ref 0–32)
AST: 17 IU/L (ref 0–40)
Albumin/Globulin Ratio: 1.4 (ref 1.2–2.2)
Albumin: 4.2 g/dL (ref 3.9–4.9)
Alkaline Phosphatase: 73 IU/L (ref 44–121)
BUN/Creatinine Ratio: 20 (ref 12–28)
BUN: 14 mg/dL (ref 8–27)
Bilirubin Total: 0.3 mg/dL (ref 0.0–1.2)
CO2: 23 mmol/L (ref 20–29)
Calcium: 9.8 mg/dL (ref 8.7–10.3)
Chloride: 104 mmol/L (ref 96–106)
Creatinine, Ser: 0.69 mg/dL (ref 0.57–1.00)
Globulin, Total: 3 g/dL (ref 1.5–4.5)
Glucose: 101 mg/dL — ABNORMAL HIGH (ref 70–99)
Potassium: 3.8 mmol/L (ref 3.5–5.2)
Sodium: 141 mmol/L (ref 134–144)
Total Protein: 7.2 g/dL (ref 6.0–8.5)
eGFR: 96 mL/min/{1.73_m2} (ref >59)

## 2022-03-19 LAB — CBC WITH DIFFERENTIAL/PLATELET
Basophils Absolute: 0 10*3/uL (ref 0.0–0.2)
Basos: 1 %
EOS (ABSOLUTE): 0.1 10*3/uL (ref 0.0–0.4)
Eos: 1 %
Hematocrit: 40.8 % (ref 34.0–46.6)
Hemoglobin: 13.8 g/dL (ref 11.1–15.9)
Immature Grans (Abs): 0 10*3/uL (ref 0.0–0.1)
Immature Granulocytes: 0 %
Lymphocytes Absolute: 1.2 10*3/uL (ref 0.7–3.1)
Lymphs: 30 %
MCH: 31.4 pg (ref 26.6–33.0)
MCHC: 33.8 g/dL (ref 31.5–35.7)
MCV: 93 fL (ref 79–97)
Monocytes Absolute: 0.3 10*3/uL (ref 0.1–0.9)
Monocytes: 6 %
Neutrophils Absolute: 2.5 10*3/uL (ref 1.4–7.0)
Neutrophils: 62 %
Platelets: 207 10*3/uL (ref 150–450)
RBC: 4.4 x10E6/uL (ref 3.77–5.28)
RDW: 13.5 % (ref 11.7–15.4)
WBC: 4.1 10*3/uL (ref 3.4–10.8)

## 2022-03-19 LAB — LIPID PANEL
Chol/HDL Ratio: 4.4 ratio (ref 0.0–4.4)
Cholesterol, Total: 166 mg/dL (ref 100–199)
HDL: 38 mg/dL — ABNORMAL LOW (ref >39)
LDL Chol Calc (NIH): 92 mg/dL (ref 0–99)
Triglycerides: 214 mg/dL — ABNORMAL HIGH (ref 0–149)
VLDL Cholesterol Cal: 36 mg/dL (ref 5–40)

## 2022-03-19 LAB — HEMOGLOBIN A1C
Est. average glucose Bld gHb Est-mCnc: 117 mg/dL
Hgb A1c MFr Bld: 5.7 % — ABNORMAL HIGH (ref 4.8–5.6)

## 2022-03-19 LAB — TSH+FREE T4
Free T4: 1.45 ng/dL (ref 0.82–1.77)
TSH: 1.9 u[IU]/mL (ref 0.450–4.500)

## 2022-03-19 LAB — VITAMIN D 25 HYDROXY (VIT D DEFICIENCY, FRACTURES): Vit D, 25-Hydroxy: 46.9 ng/mL (ref 30.0–100.0)

## 2022-03-19 LAB — B12 AND FOLATE PANEL
Folate: 5.8 ng/mL (ref >3.0)
Vitamin B-12: 444 pg/mL (ref 232–1245)

## 2022-03-19 MED ORDER — CLONAZEPAM 0.5 MG PO TABS: 0.2500 mg | ORAL_TABLET | Freq: Two times a day (BID) | ORAL | 0 refills | Status: DC | PRN

## 2022-03-19 NOTE — Telephone Encounter (Signed)
Pt called to cancel procedure for 03/23/22 at 9:15 due to being sick. She is uncertain if she may have COVID. She says she will call back to reschedule once she is feeling better. FYI Dr.Carver

## 2022-03-19 NOTE — Telephone Encounter (Signed)
Katie Davis presented yesterday for routine labs.  She was notified of results today and contacted our office to discuss the results.  She was specifically concerned about her A1c which was 5.7.  I stated that this reflected adequate control in the setting of a prior history of diabetes.  She established care at [facility] on 02/19/2022.  A recent hemoglobin A1c value of 6.6 was noted on labs scanned to our office from her previous PCP office, meeting criteria for the diagnosis of diabetes mellitus.  Accordingly, this was added to her past medical history.  [name] relates today that she has never been diagnosed with diabetes mellitus highest her A1c has ever been 5.9.  She is aware of a history of prediabetes, but states that she has never been diagnosed with diabetes mellitus.  On review of previously scanned documents again today, it appears that we were sent labs for a different patient.  I will update Ms. Leonhard's past medical history to reflect a history of prediabetes.  We will also attempt to remove the scanned documents pertaining to a different patient from her record.

## 2022-03-20 DIAGNOSIS — J019 Acute sinusitis, unspecified: Secondary | ICD-10-CM | POA: Diagnosis not present

## 2022-03-20 DIAGNOSIS — J029 Acute pharyngitis, unspecified: Secondary | ICD-10-CM | POA: Diagnosis not present

## 2022-03-20 DIAGNOSIS — J069 Acute upper respiratory infection, unspecified: Secondary | ICD-10-CM | POA: Diagnosis not present

## 2022-03-23 ENCOUNTER — Encounter (HOSPITAL_COMMUNITY): Admission: RE | Payer: Self-pay | Source: Home / Self Care

## 2022-03-23 ENCOUNTER — Ambulatory Visit (HOSPITAL_COMMUNITY): Admission: RE | Admit: 2022-03-23 | Payer: Medicare Other | Source: Home / Self Care

## 2022-03-23 SURGERY — ESOPHAGOGASTRODUODENOSCOPY (EGD) WITH PROPOFOL
Anesthesia: Monitor Anesthesia Care

## 2022-04-01 ENCOUNTER — Other Ambulatory Visit: Payer: Self-pay | Admitting: Internal Medicine

## 2022-04-01 ENCOUNTER — Encounter: Payer: Self-pay | Admitting: Internal Medicine

## 2022-04-02 ENCOUNTER — Ambulatory Visit: Payer: BLUE CROSS/BLUE SHIELD | Admitting: Nutrition

## 2022-04-02 ENCOUNTER — Ambulatory Visit: Payer: Medicare Other | Admitting: Internal Medicine

## 2022-04-02 ENCOUNTER — Encounter: Payer: Self-pay | Admitting: Internal Medicine

## 2022-04-02 ENCOUNTER — Other Ambulatory Visit: Payer: Self-pay

## 2022-04-02 ENCOUNTER — Other Ambulatory Visit: Payer: Self-pay | Admitting: Emergency Medicine

## 2022-04-02 MED ORDER — ONDANSETRON HCL 4 MG PO TABS: 4.0000 mg | ORAL_TABLET | Freq: Three times a day (TID) | ORAL | 0 refills | Status: DC | PRN

## 2022-04-05 MED ORDER — SYNTHROID 112 MCG PO TABS: 112.0000 ug | ORAL_TABLET | ORAL | 1 refills | Status: DC

## 2022-04-06 ENCOUNTER — Ambulatory Visit: Payer: Medicare Other | Admitting: Internal Medicine

## 2022-04-21 ENCOUNTER — Encounter: Payer: Medicare Other | Admitting: Internal Medicine

## 2022-04-21 ENCOUNTER — Telehealth: Payer: Self-pay | Admitting: Internal Medicine

## 2022-04-21 ENCOUNTER — Ambulatory Visit (INDEPENDENT_AMBULATORY_CARE_PROVIDER_SITE_OTHER): Payer: Medicare Other | Admitting: Internal Medicine

## 2022-04-21 ENCOUNTER — Encounter: Payer: Self-pay | Admitting: Internal Medicine

## 2022-04-21 VITALS — BP 117/78 | HR 69 | Ht 63.0 in | Wt 173.0 lb

## 2022-04-21 DIAGNOSIS — Z636 Dependent relative needing care at home: Secondary | ICD-10-CM | POA: Diagnosis not present

## 2022-04-21 DIAGNOSIS — M810 Age-related osteoporosis without current pathological fracture: Secondary | ICD-10-CM | POA: Diagnosis not present

## 2022-04-21 DIAGNOSIS — E782 Mixed hyperlipidemia: Secondary | ICD-10-CM | POA: Diagnosis not present

## 2022-04-21 NOTE — Progress Notes (Signed)
Established Patient Office Visit  Subjective   Patient ID: Katie Davis, female    DOB: May 23, 1955  Age: 67 y.o. MRN: [id number]  Chief Complaint  Patient presents with   Hyperlipidemia    Follow up   Katie Davis returns to care today.  She was last seen by me to establish care on 02/19/22.  Baseline labs were ordered and 4-week follow-up was arranged.  There have been no acute interval events.  Katie Davis reports feeling well today.  She is asymptomatic and has no acute concerns to discuss.  Past Medical History:  Diagnosis Date   Abnormal glandular Papanicolaou smear of cervix    Allergy 2008   Latex ..after surgery   Anxiety    Asthma    Body mass index 30.0-30.9, adult    Celiac disease    Chronic vascular disorder of intestine (HCC)    Encounter for general adult medical examination with abnormal findings 05/07/2020   Fibromyalgia    GERD (gastroesophageal reflux disease) 14 years ago   Graves disease    Headache    Migrains   Hyperlipidemia    Hypertension    Hypothyroidism    IBS (irritable bowel syndrome) With constipation    ILD (interstitial lung disease) ([place])    Lupus (systemic lupus erythematosus) ([place])    Obesity, unspecified    Osteoporosis    Raynaud disease    Sjogren's disease ([place])    Type 2 diabetes mellitus without complications ([place]) 85/27/7824   Past Surgical History:  Procedure Laterality Date   APPENDECTOMY     CHOLECYSTECTOMY     COLONOSCOPY WITH PROPOFOL N/A 06/10/2020   Surgeon: Eloise Harman, DO;  diverticulosis and nonbleeding internal hemorrhoids, due for repeat in 2032.   DG THUMB RIGHT HAND ([place] HX)     EYE SURGERY  2019   Cataract. Right eye   little finger Right    placement of pin   OTHER SURGICAL HISTORY     biopsies on both feet   REDUCTION MAMMAPLASTY     Social History   Tobacco Use   Smoking status: Never    Passive exposure: Never   Smokeless tobacco: Never  Vaping Use   Vaping Use: Never used  Substance Use  Topics   Alcohol use: Not Currently    Comment: wine occ   Drug use: Never   Family History  Problem Relation Age of Onset   Congestive Heart Failure Mother    Arthritis Mother    Aneurysm Father    Celiac disease Brother    Melanoma Brother    Aneurysm Brother        stomach   Asthma Brother    Cancer Brother    Stroke Sister    Celiac disease Sister    Irritable bowel syndrome Sister    Cancer Sister    Celiac disease Brother    Aneurysm Sister    Other Sister        multiple back surgeries   Colon cancer Neg Hx    Allergies  Allergen Reactions   Prednisone Palpitations   Azithromycin     vomiting   Bactrim [Sulfamethoxazole-Trimethoprim] Nausea And Vomiting    Migraines    Codeine Nausea And Vomiting   Denosumab     Other reaction(s): Unknown   Gluten Meal Other (See Comments)    Ceiliac disease   Influenza Vaccines Swelling   Keflex [Cephalexin] Nausea And Vomiting   Levaquin [Levofloxacin] Nausea And Vomiting   Sulfa  Antibiotics Nausea And Vomiting   Contrast Media [Iodinated Contrast Media] Nausea And Vomiting and Rash    Per patient, did fine with premedication with IV steroids before    Doxycycline Palpitations    Made body feel crazy, increased heart rate   Latex Hives and Rash   Review of Systems  Constitutional:  Negative for chills and fever.  HENT:  Negative for sore throat.   Respiratory:  Negative for cough and shortness of breath.   Cardiovascular:  Negative for chest pain, palpitations and leg swelling.  Gastrointestinal:  Negative for abdominal pain, blood in stool, constipation, diarrhea, nausea and vomiting.  Genitourinary:  Negative for dysuria and hematuria.  Musculoskeletal:  Negative for myalgias.  Skin:  Negative for itching and rash.  Neurological:  Negative for dizziness and headaches.  Psychiatric/Behavioral:  Negative for depression and suicidal ideas.       Objective:     BP 117/78   Pulse 69   Ht 5\' 3"  (1.6 m)   Wt 173  lb (78.5 kg)   SpO2 95%   BMI 30.65 kg/m  BP Readings from Last 3 Encounters:  04/21/22 117/78  02/19/22 111/70  02/18/22 112/76   Physical Exam Vitals reviewed.  Constitutional:      General: She is not in acute distress.    Appearance: Normal appearance. She is not toxic-appearing.  HENT:     Head: Normocephalic and atraumatic.     Right Ear: External ear normal.     Left Ear: External ear normal.     Nose: Nose normal. No congestion or rhinorrhea.     Mouth/Throat:     Mouth: Mucous membranes are moist.     Pharynx: Oropharynx is clear. No oropharyngeal exudate or posterior oropharyngeal erythema.  Eyes:     General: No scleral icterus.    Extraocular Movements: Extraocular movements intact.     Conjunctiva/sclera: Conjunctivae normal.     Pupils: Pupils are equal, round, and reactive to light.  Cardiovascular:     Rate and Rhythm: Normal rate and regular rhythm.     Pulses: Normal pulses.     Heart sounds: Normal heart sounds. No murmur heard.    No friction rub. No gallop.  Pulmonary:     Effort: Pulmonary effort is normal.     Breath sounds: Normal breath sounds. No wheezing, rhonchi or rales.  Abdominal:     General: Abdomen is flat. Bowel sounds are normal. There is no distension.     Palpations: Abdomen is soft.     Tenderness: There is no abdominal tenderness.  Musculoskeletal:        General: No swelling. Normal range of motion.     Cervical back: Normal range of motion.     Right lower leg: No edema.     Left lower leg: No edema.  Lymphadenopathy:     Cervical: No cervical adenopathy.  Skin:    General: Skin is warm and dry.     Capillary Refill: Capillary refill takes less than 2 seconds.     Coloration: Skin is not jaundiced.  Neurological:     General: No focal deficit present.     Mental Status: She is alert and oriented to person, place, and time.  Psychiatric:        Mood and Affect: Mood normal.        Behavior: Behavior normal.   Last  CBC Lab Results  Component Value Date   WBC 4.1 03/18/2022   HGB 13.8  03/18/2022   HCT 40.8 03/18/2022   MCV 93 03/18/2022   MCH 31.4 03/18/2022   RDW 13.5 03/18/2022   PLT 207 28/31/5176   Last metabolic panel Lab Results  Component Value Date   GLUCOSE 101 (H) 03/18/2022   NA 141 03/18/2022   K 3.8 03/18/2022   CL 104 03/18/2022   CO2 23 03/18/2022   BUN 14 03/18/2022   CREATININE 0.69 03/18/2022   EGFR 96 03/18/2022   CALCIUM 9.8 03/18/2022   PROT 7.2 03/18/2022   ALBUMIN 4.2 03/18/2022   LABGLOB 3.0 03/18/2022   AGRATIO 1.4 03/18/2022   BILITOT 0.3 03/18/2022   ALKPHOS 73 03/18/2022   AST 17 03/18/2022   ALT 15 03/18/2022   Last lipids Lab Results  Component Value Date   CHOL 166 03/18/2022   HDL 38 (L) 03/18/2022   LDLCALC 92 03/18/2022   TRIG 214 (H) 03/18/2022   CHOLHDL 4.4 03/18/2022   Last hemoglobin A1c Lab Results  Component Value Date   HGBA1C 5.7 (H) 03/18/2022   Last thyroid functions Lab Results  Component Value Date   TSH 1.900 03/18/2022   Last vitamin D Lab Results  Component Value Date   VD25OH 46.9 03/18/2022   Last vitamin B12 and Folate Lab Results  Component Value Date   VITAMINB12 444 03/18/2022   FOLATE 5.8 03/18/2022   The 10-year ASCVD risk score (Arnett DK, et al., 2019) is: 12.4%    Assessment & Plan:   Problem List Items Addressed This Visit       Osteoporosis - Primary    Previously documented history of osteoporosis based on DEXA scan completed in August 2023.  T-score -2.9 at that time.  She is not currently on any specific therapy for treatment of osteoporosis, but reports today that she has previously been prescribed Prolia but experienced adverse side effects. -Discussed starting oral bisphosphonate, however she is not interested in additional treatment at this time.  She remains on vitamin D and calcium supplementation.      Hyperlipidemia    Lipid panel updated in early January.  Total cholesterol 166  and LDL 92.  She is currently prescribed rosuvastatin 5 mg 4 times weekly and is also on fish oil supplementation. -No changes today.  Continue current medication regimen.      Caregiver stress    Today she relays multiple concerns related to her husband's health.  This has taken a significant toll on Katie Davis as well.  She became tearful during our discussion.  She is interested in counseling services. -Psychiatry referral placed today       Return in about 3 months (around 07/20/2022).    Johnette Abraham, MD

## 2022-04-21 NOTE — Assessment & Plan Note (Signed)
Today she relays multiple concerns related to her husband's health.  This has taken a significant toll on Katie Davis as well.  She became tearful during our discussion.  She is interested in counseling services. -Psychiatry referral placed today

## 2022-04-21 NOTE — Patient Instructions (Signed)
It was a pleasure to see you today.  Thank you for giving [place] the opportunity to be involved in your care.  Below is a brief recap of your visit and next steps.  We will plan to see you again in 3 months.  Summary No medication changes today I have placed a referral to psychiatry  We will plan follow up in 3 months

## 2022-04-21 NOTE — Telephone Encounter (Signed)
On 10/03/22 Katie Davis has a lab report scanned into the media tab regarding Diabetes.  This was the wrong lab result scanned into this chart.  Katie Davis does not have Diabetes.  Kristen the [place] for Dr Doren Custard entered a Epic ticket for this correction.  As of today this is still not corrected.  [employer].  I have sent this to Dian Queen to review as well.  I am contacting back IT to let them know it is still in the chart.  We do have a notice it is corrected but it is not. I have talked with the patient to let her know we are working with many sources to correct this.  Dr Doren Custard is worried about this since it keeps triggering test that do not need to be done. Per Dr Doren Custard the patient is not Diabetic and does not need any follow up for this.  He has removed the Diabetic DX.

## 2022-04-21 NOTE — Assessment & Plan Note (Signed)
Lipid panel updated in early January.  Total cholesterol 166 and LDL 92.  She is currently prescribed rosuvastatin 5 mg 4 times weekly and is also on fish oil supplementation. -No changes today.  Continue current medication regimen.

## 2022-04-21 NOTE — Assessment & Plan Note (Signed)
Previously documented history of osteoporosis based on DEXA scan completed in August 2023.  T-score -2.9 at that time.  She is not currently on any specific therapy for treatment of osteoporosis, but reports today that she has previously been prescribed Prolia but experienced adverse side effects. -Discussed starting oral bisphosphonate, however she is not interested in additional treatment at this time.  She remains on vitamin D and calcium supplementation.

## 2022-04-23 ENCOUNTER — Telehealth: Payer: Self-pay | Admitting: Internal Medicine

## 2022-04-23 NOTE — Telephone Encounter (Signed)
Called patient to let her know the chart correction is done and all the diabetic information has been removed per Dian Queen.  Dr Doren Custard is aware this is corrected.

## 2022-04-27 DIAGNOSIS — H04123 Dry eye syndrome of bilateral lacrimal glands: Secondary | ICD-10-CM | POA: Diagnosis not present

## 2022-04-28 ENCOUNTER — Encounter (INDEPENDENT_AMBULATORY_CARE_PROVIDER_SITE_OTHER): Payer: Self-pay

## 2022-05-05 ENCOUNTER — Ambulatory Visit: Payer: Medicare Other | Admitting: Pulmonary Disease

## 2022-05-06 ENCOUNTER — Encounter: Payer: Self-pay | Admitting: Internal Medicine

## 2022-05-14 ENCOUNTER — Encounter: Payer: Self-pay | Admitting: Radiology

## 2022-05-19 ENCOUNTER — Ambulatory Visit: Payer: Medicare Other | Admitting: Cardiology

## 2022-05-21 ENCOUNTER — Ambulatory Visit: Payer: Self-pay | Admitting: Pulmonary Disease

## 2022-05-22 ENCOUNTER — Telehealth: Payer: Self-pay | Admitting: Internal Medicine

## 2022-05-22 NOTE — Telephone Encounter (Signed)
Patient did COVID test negative and advised if she didn't start feeling better to let us know.

## 2022-05-22 NOTE — Telephone Encounter (Signed)
Pt called stating she has been sick a couple days, vomiting last night, waiting on home covid test results. Can't eat anything stomach gets up set, sweaty & cold, runny nose & congestion. Wants to know what to do?

## 2022-05-27 ENCOUNTER — Encounter: Payer: Self-pay | Admitting: Internal Medicine

## 2022-05-28 ENCOUNTER — Other Ambulatory Visit: Payer: Self-pay

## 2022-05-28 MED ORDER — IBUPROFEN 800 MG PO TABS: 800.0000 mg | ORAL_TABLET | Freq: Three times a day (TID) | ORAL | 0 refills | Status: DC | PRN

## 2022-06-04 ENCOUNTER — Other Ambulatory Visit: Payer: Self-pay

## 2022-06-04 DIAGNOSIS — R198 Other specified symptoms and signs involving the digestive system and abdomen: Secondary | ICD-10-CM

## 2022-06-04 MED ORDER — LINACLOTIDE 145 MCG PO CAPS: 145.0000 ug | ORAL_CAPSULE | Freq: Every day | ORAL | 3 refills | Status: DC

## 2022-06-15 ENCOUNTER — Other Ambulatory Visit: Payer: Self-pay

## 2022-06-15 DIAGNOSIS — E039 Hypothyroidism, unspecified: Secondary | ICD-10-CM

## 2022-06-15 MED ORDER — ATENOLOL 50 MG PO TABS: 50.0000 mg | ORAL_TABLET | Freq: Two times a day (BID) | ORAL | 3 refills | Status: DC

## 2022-06-16 ENCOUNTER — Encounter: Payer: Self-pay | Admitting: Pulmonary Disease

## 2022-06-16 ENCOUNTER — Ambulatory Visit: Payer: Medicare Other | Admitting: Pulmonary Disease

## 2022-06-16 VITALS — BP 132/66 | HR 68 | Ht 63.0 in | Wt 172.6 lb

## 2022-06-16 DIAGNOSIS — J4550 Severe persistent asthma, uncomplicated: Secondary | ICD-10-CM

## 2022-06-16 DIAGNOSIS — E039 Hypothyroidism, unspecified: Secondary | ICD-10-CM | POA: Diagnosis not present

## 2022-06-16 MED ORDER — FLUTICASONE FUROATE-VILANTEROL 200-25 MCG/ACT IN AEPB: 1.0000 | INHALATION_SPRAY | Freq: Every day | RESPIRATORY_TRACT | 1 refills | Status: DC

## 2022-06-16 NOTE — Patient Instructions (Signed)
Will change to Breo 200 one puff daily, and rinse your mouth after each use.  Follow up in 4 to 5 months.

## 2022-06-16 NOTE — Progress Notes (Signed)
[name]  Chief Complaint  Patient presents with   Follow-up    Doing well  Going to gym/ weather change is hard on pt     Constitutional:  BP 132/66   Pulse 68   Ht 5\' 3"  (1.6 m)   Wt 172 lb 9.6 oz (78.3 kg)   SpO2 97% Comment: ra  BMI 30.57 kg/m   Past Medical History:  Anxiety, Celiac disease, Fibromyalgia, Grave's disease, HLD, Migraine headaches, HTN, IBS, Lupus, Osteoporosis, Raynaud disease, Sjogren's disease, COVID 19 August/September/October 2022  Past Surgical History:  She  has a past surgical history that includes Reduction mammaplasty; Cholecystectomy; Appendectomy; OTHER SURGICAL HISTORY; Colonoscopy with propofol (N/A, 06/10/2020); Eye surgery (2019); DG THUMB RIGHT HAND ([place] [facility]X); and little finger (Right).  Brief Summary:  Katie Davis is a 67 y.o. female with asthma and history of COVID.      Subjective:   She has been doing well.  She goes to the gym every day.  She does both cardio and strength training.  She has more chest congestion with Spring allergy season.  She uses flonase and this helps with her sinuses.  She continues to have trouble with dry eyes and mouth.  Physical Exam:   Appearance - well kempt   ENMT - no sinus tenderness, no oral exudate, no LAN, Mallampati 3 airway, no stridor  Respiratory - equal breath sounds bilaterally, no wheezing or rales  CV - s1s2 regular rate and rhythm, no murmurs  Ext - no clubbing, no edema  Skin - no rashes  Psych - normal mood and affect     Pulmonary testing:  PFT 02/27/21 >> FEV1 1.95 (82%), FEV1% 83, TLC 4.75 (96%), DLCO 68%  Chest Imaging:  HRCT chest 03/05/21 >> enlarged PA, atherosclerosis, basilar and peripheral GGO with bronchiectasis, calcified granuloma LLL, air trapping  Cardiac testing:  Echo 03/07/21 >> EF 55 to 60%, mild LVH  Social History:  She  reports that she has never smoked. She has never been exposed to tobacco  smoke. She has never used smokeless tobacco. She reports that she does not currently use alcohol. She reports that she does not use drugs.  Family History:  Her family history includes Aneurysm in her brother, father, and sister; Arthritis in her mother; Asthma in her brother; Cancer in her brother and sister; Celiac disease in her brother, brother, and sister; Congestive Heart Failure in her mother; Irritable bowel syndrome in her sister; Melanoma in her brother; Other in her sister; Stroke in her sister.     Assessment/Plan:   Severe, persistent asthma. - she was not able to tolerate LAMAs and singulair - will increase to breo 200 during Spring allergy season; can switch back to breo 100 after this - prn albuterol - she has multiple medication sensitivities/allergies; she has been able to tolerate cefuroxime and medrol when she has exacerbations more recently  Interstitial lung disease. - changes consistent with prior COVID 19 infections - functional status much improved  Grave's disease. - she is followed by Dr. Buddy Duty with Endocrinology  Celiac disease. - followed by Dr. Abbey Chatters with [facility]  Hx of Sjogren's disease, Lupus. - previously followed by rheumatology while living in [place] - she has rheumatology appointment with Dr. Ignacia Marvel in May, and will be seeing ophthalmology in [place]   Time Spent Involved in Patient Care on Day of Examination:  28 minutes  Follow up:   Patient Instructions  Will change  to Breo 200 one puff daily, and rinse your mouth after each use.  Follow up in 4 to 5 months.  Medication List:   Allergies as of 06/16/2022       Reactions   Prednisone Palpitations   Azithromycin    vomiting   Bactrim [sulfamethoxazole-trimethoprim] Nausea And Vomiting   Migraines    Codeine Nausea And Vomiting   Denosumab    Other reaction(s): Unknown   Gluten Meal Other (See Comments)   Ceiliac disease   Influenza Vaccines  Swelling   Keflex [cephalexin] Nausea And Vomiting   Levaquin [levofloxacin] Nausea And Vomiting   Sulfa Antibiotics Nausea And Vomiting   Contrast Media [iodinated Contrast Media] Nausea And Vomiting, Rash   Per patient, did fine with premedication with IV steroids before    Doxycycline Palpitations   Made body feel crazy, increased heart rate   Latex Hives, Rash        Medication List        Accurate as of June 16, 2022 11:04 AM. If you have any questions, ask your nurse or doctor.          STOP taking these medications    Breo Ellipta 100-25 MCG/ACT Aepb Generic drug: fluticasone furoate-vilanterol Replaced by: fluticasone furoate-vilanterol 200-25 MCG/ACT Aepb Stopped by: Chesley Mires, MD       TAKE these medications    acidophilus Caps capsule Take 1 capsule by mouth daily.   albuterol 108 (90 Base) MCG/ACT inhaler Commonly known as: VENTOLIN HFA Inhale 1-2 puffs into the lungs every 6 (six) hours as needed for wheezing or shortness of breath.   aspirin EC 81 MG tablet Take 1 tablet (81 mg total) by mouth daily. Swallow whole.   atenolol 50 MG tablet Commonly known as: TENORMIN Take 1 tablet (50 mg total) by mouth 2 (two) times daily.   CALCIUM CARBONATE ANTACID PO Take 2 tablets by mouth daily.   clonazePAM 0.5 MG tablet Commonly known as: KLONOPIN Take 0.5-1 tablets (0.25-0.5 mg total) by mouth 2 (two) times daily as needed for anxiety.   ELDERBERRY PO Take 200 mg by mouth daily. 100 mg   Fish Oil 1000 MG Caps Take 2,000 mg by mouth daily.   fluticasone 50 MCG/ACT nasal spray Commonly known as: FLONASE Place 1 spray into both nostrils daily as needed (Sinus).   fluticasone furoate-vilanterol 200-25 MCG/ACT Aepb Commonly known as: Breo Ellipta Inhale 1 puff into the lungs daily. Replaces: Breo Ellipta 100-25 MCG/ACT Aepb Started by: Chesley Mires, MD   ibuprofen 800 MG tablet Commonly known as: ADVIL Take 1 tablet (800 mg total) by mouth  every 8 (eight) hours as needed for moderate pain.   linaclotide 145 MCG Caps capsule Commonly known as: Linzess Take 1 capsule (145 mcg total) by mouth daily before breakfast.   ondansetron 4 MG tablet Commonly known as: Zofran Take 1 tablet (4 mg total) by mouth every 8 (eight) hours as needed for nausea or vomiting.   pantoprazole 40 MG tablet Commonly known as: PROTONIX Take 1 tablet (40 mg total) by mouth daily before breakfast.   rosuvastatin 5 MG tablet Commonly known as: CRESTOR Take 5 mg by mouth every Monday, Wednesday, and Friday.   Synthroid 100 MCG tablet Generic drug: levothyroxine Take 100 mcg by mouth See admin instructions. Takes every other day. Alternates with 112 mcg  "Brand Name"   Synthroid 112 MCG tablet Generic drug: levothyroxine Take 1 tablet (112 mcg total) by mouth See admin instructions. Alternates every  other day with 100 mcg "Brand Name"   Systane Hydration PF 0.4-0.3 % Soln Generic drug: Polyethyl Glyc-Propyl Glyc PF Place 1 drop into both eyes daily as needed (Dry eye).   VITAMIN D3 GUMMIES PO Take 6,000 Units by mouth daily. 2000 units        Signature:  Chesley Mires, MD [place] Pager - [phone] 06/16/2022, 11:04 AM

## 2022-06-17 ENCOUNTER — Encounter: Payer: Self-pay | Admitting: Internal Medicine

## 2022-06-17 ENCOUNTER — Ambulatory Visit: Payer: Medicare Other | Admitting: Student

## 2022-06-17 LAB — TSH: TSH: 1.62 u[IU]/mL (ref 0.450–4.500)

## 2022-06-17 LAB — T4, FREE: Free T4: 1.33 ng/dL (ref 0.82–1.77)

## 2022-06-17 NOTE — Progress Notes (Deleted)
   Cardiology Office Note    Date:  06/17/2022  ID:  Katie Davis, DOB [birth year], MRN [id number] Cardiologist: None    History of Present Illness:    Katie Davis is a 67 y.o. female with past medical history of coronary calcifications by CT (low-risk NST in 03/2021), interstitial lung disease, severe persistent asthma, LBBB, HLD, Sjogren's Disease, Lupus and celiac artery stenosis who presents to the office today for 10-month follow-up.  She was examined by Dr. Harl Bowie in 10/2021 and denied any recent chest pain at that time.  Did report muscle aches with statins and was taking Rosuvastatin 5 mg 4 times per week.  No changes were made to her medications at that time.  ROS: ***  Studies Reviewed:   EKG: EKG is*** ordered today and demonstrates ***  Echocardiogram: 02/2021 IMPRESSIONS     1. Mild septal hypokinesis consistent with conduction delay.. Left  ventricular ejection fraction, by estimation, is 55 to 60%. The left  ventricle has normal function. There is mild left ventricular hypertrophy.  Left ventricular diastolic parameters were   normal.   2. Right ventricular systolic function is normal. The right ventricular  size is normal.   3. The mitral valve is normal in structure. Trivial mitral valve  regurgitation.   4. The aortic valve is tricuspid. Aortic valve regurgitation is trivial.  Aortic valve sclerosis is present, with no evidence of aortic valve  stenosis.   5. The inferior vena cava is normal in size with greater than 50%  respiratory variability, suggesting right atrial pressure of 3 mmHg.    NST: 03/2021   The study is low risk.   No ST deviation was noted.   LV perfusion is abnormal. Defect 1: There is a small defect with mild reduction in uptake present in the apical to mid anteroseptal location(s) and apex that is partially reversible. There is normal wall motion in the defect area. May be consistent with artifact given normal wall motion vs small area of  ischemia. Overall low risk.   Left ventricular function is normal. End diastolic cavity size is normal.  Risk Assessment/Calculations:   {Does this patient have ATRIAL FIBRILLATION?[phone]}           Physical Exam:   VS:  There were no vitals taken for this visit.   Wt Readings from Last 3 Encounters:  06/16/22 172 lb 9.6 oz (78.3 kg)  04/21/22 173 lb (78.5 kg)  03/18/22 170 lb (77.1 kg)     GEN: Well nourished, well developed in no acute distress NECK: No JVD; No carotid bruits CARDIAC: ***RRR, no murmurs, rubs, gallops RESPIRATORY:  Clear to auscultation without rales, wheezing or rhonchi  ABDOMEN: Appears non-distended. No obvious abdominal masses. EXTREMITIES: No clubbing or cyanosis. No edema.  Distal pedal pulses are 2+ bilaterally.   Assessment and Plan:   1. Coronary Calcification by CT - She was noted to have coronary calcifications by prior CT imaging and underwent stress testing in 03/2021 which was low risk. ***  2. HLD - FLP in 03/2022 showed total cholesterol 166, triglycerides 214, HDL 38 and LDL 92.  She has been intolerant to high intensity statin therapy and remains on Crestor 5 mg. ***  3. LBBB - ***  Signed, Erma Heritage, PA-C

## 2022-06-19 ENCOUNTER — Ambulatory Visit: Payer: Medicare Other | Admitting: Student

## 2022-06-24 ENCOUNTER — Ambulatory Visit: Payer: Medicare Other | Admitting: Obstetrics & Gynecology

## 2022-06-25 ENCOUNTER — Encounter: Payer: Self-pay | Admitting: Student

## 2022-06-25 ENCOUNTER — Other Ambulatory Visit: Payer: Self-pay | Admitting: Internal Medicine

## 2022-06-25 ENCOUNTER — Ambulatory Visit: Payer: Medicare Other | Attending: Student | Admitting: Student

## 2022-06-25 VITALS — BP 110/66 | HR 77 | Ht 63.0 in | Wt 172.0 lb

## 2022-06-25 DIAGNOSIS — I447 Left bundle-branch block, unspecified: Secondary | ICD-10-CM | POA: Diagnosis not present

## 2022-06-25 DIAGNOSIS — E782 Mixed hyperlipidemia: Secondary | ICD-10-CM | POA: Diagnosis not present

## 2022-06-25 DIAGNOSIS — I2510 Atherosclerotic heart disease of native coronary artery without angina pectoris: Secondary | ICD-10-CM

## 2022-06-25 MED ORDER — ONDANSETRON HCL 4 MG PO TABS: 4.0000 mg | ORAL_TABLET | Freq: Three times a day (TID) | ORAL | 0 refills | Status: DC | PRN

## 2022-06-25 NOTE — Patient Instructions (Signed)
Medication Instructions:  Your physician recommends that you continue on your current medications as directed. Please refer to the Current Medication list given to you today.  *If you need a refill on your cardiac medications before your next appointment, please call your pharmacy*   Lab Work: NONE   If you have labs (blood work) drawn today and your tests are completely normal, you will receive your results only by: MyChart Message (if you have MyChart) OR A paper copy in the mail If you have any lab test that is abnormal or we need to change your treatment, we will call you to review the results.   Testing/Procedures: NONE    Follow-Up: At [facility], you and your health needs are our priority.  As part of our continuing mission to provide you with exceptional heart care, we have created designated Provider Care Teams.  These Care Teams include your primary Cardiologist (physician) and Advanced Practice Providers (APPs -  Physician Assistants and Nurse Practitioners) who all work together to provide you with the care you need, when you need it.  We recommend signing up for the patient portal called "MyChart".  Sign up information is provided on this After Visit Summary.  MyChart is used to connect with patients for Virtual Visits (Telemedicine).  Patients are able to view lab/test results, encounter notes, upcoming appointments, etc.  Non-urgent messages can be sent to your provider as well.   To learn more about what you can do with MyChart, go to [email or web address].    Your next appointment:   1 year(s)  Provider:   You may see Branch, Jonathan, MD or one of the following Advanced Practice Providers on your designated Care Team:   Brittany Strader, PA-C  Michele Lenze, PA-C     Other Instructions Thank you for choosing [facility]!    

## 2022-06-25 NOTE — Progress Notes (Signed)
Cardiology Office Note    Date:  06/25/2022  ID:  Katie Davis, DOB [birth year], MRN [id number] Cardiologist: Dina Rich, MD    History of Present Illness:    Katie Davis is a 67 y.o. female with past medical history of coronary calcification by CT (low-risk NST in 03/2021), interstitial lung disease, severe persistent asthma, LBBB, HLD, Sjogren's Disease, Lupus and celiac disease who presents to the office today for 65-month follow-up.   She was examined by Dr. Wyline Mood in 10/2021 and denied any recent chest pain at that time. Did report muscle aches with statins and was taking Rosuvastatin 5 mg 4 times per week. No changes were made to her medications at that time.  In talking with the patient today, she reports her respiratory status and energy level have significantly improved over the past several months. She previously had COVID over 4 separate times in 2022. She has started exercising at the gym at least 5 days a week and denies any anginal symptoms with this. No recent orthopnea, PND or pitting edema. She denies any recent exertional chest pain or palpitations. She has been under increased stress as she is the primary caregiver for her husband who suffered a massive stroke several years ago.   Studies Reviewed:   EKG: EKG is ordered today and demonstrates NSR, HR 66 with known LBBB.   Echocardiogram: 02/2021 IMPRESSIONS     1. Mild septal hypokinesis consistent with conduction delay.. Left  ventricular ejection fraction, by estimation, is 55 to 60%. The left  ventricle has normal function. There is mild left ventricular hypertrophy.  Left ventricular diastolic parameters were   normal.   2. Right ventricular systolic function is normal. The right ventricular  size is normal.   3. The mitral valve is normal in structure. Trivial mitral valve  regurgitation.   4. The aortic valve is tricuspid. Aortic valve regurgitation is trivial.  Aortic valve sclerosis is present,  with no evidence of aortic valve  stenosis.   5. The inferior vena cava is normal in size with greater than 50%  respiratory variability, suggesting right atrial pressure of 3 mmHg.      NST: 03/2021   The study is low risk.   No ST deviation was noted.   LV perfusion is abnormal. Defect 1: There is a small defect with mild reduction in uptake present in the apical to mid anteroseptal location(s) and apex that is partially reversible. There is normal wall motion in the defect area. May be consistent with artifact given normal wall motion vs small area of ischemia. Overall low risk.   Left ventricular function is normal. End diastolic cavity size is normal.  Physical Exam:   VS:  BP 110/66   Pulse 77   Ht 5\' 3"  (1.6 m)   Wt 172 lb (78 kg)   SpO2 98%   BMI 30.47 kg/m    Wt Readings from Last 3 Encounters:  06/25/22 172 lb (78 kg)  06/16/22 172 lb 9.6 oz (78.3 kg)  04/21/22 173 lb (78.5 kg)     GEN: Well nourished, well developed female appearing in no acute distress NECK: No JVD; No carotid bruits CARDIAC: RRR, no murmurs, rubs, gallops RESPIRATORY:  Clear to auscultation without rales, wheezing or rhonchi  ABDOMEN: Appears non-distended. No obvious abdominal masses. EXTREMITIES: No clubbing or cyanosis. No pitting edema.  Distal pedal pulses are 2+ bilaterally.   Assessment and Plan:   1. Coronary Calcification by CT - She was noted to  have coronary calcifications by prior CT imaging and underwent stress testing in 03/2021 which was low-risk.  - She denies any recent anginal symptoms. Continue with risk factor modification. She is on ASA 81 mg daily and Crestor 5 mg MWF.  Of note, she does take Atenolol 50 mg twice daily but has been on this given her history of migraine headaches.   2. HLD - FLP in 03/2022 showed total cholesterol 166, triglycerides 214, HDL 38 and LDL 92. She has been intolerant to high intensity statin therapy and remains on Crestor 5 mg with no reported  side effects.   3. LBBB - Noted on prior EKG tracings. Prior stress testing last year was low-risk as outlined above.   Signed, Ellsworth Lennox, PA-C

## 2022-07-02 ENCOUNTER — Ambulatory Visit (HOSPITAL_COMMUNITY): Payer: Self-pay | Admitting: Clinical

## 2022-07-03 ENCOUNTER — Encounter: Payer: Self-pay | Admitting: Internal Medicine

## 2022-07-03 ENCOUNTER — Other Ambulatory Visit: Payer: Self-pay

## 2022-07-03 ENCOUNTER — Telehealth: Payer: Self-pay

## 2022-07-03 ENCOUNTER — Encounter: Payer: Self-pay | Admitting: Pulmonary Disease

## 2022-07-03 MED ORDER — AZITHROMYCIN 250 MG PO TABS: ORAL_TABLET | ORAL | 0 refills | Status: AC

## 2022-07-03 NOTE — Telephone Encounter (Signed)
Okay to send a zpak.

## 2022-07-03 NOTE — Telephone Encounter (Signed)
Called and spoke w/ pt after receiving pt advice message she reports that 3 days ago she started experiencing. She states that she is having nasal congestion/ sinus drainage that she feels like is draining to her lung. She reports that he baseline is a low grade fever and she cannot tell difference between her regular temp.   She is requesting a zpak to help her over this sickness. Dr.Sood please advise?

## 2022-07-06 ENCOUNTER — Other Ambulatory Visit: Payer: Self-pay

## 2022-07-06 ENCOUNTER — Encounter: Payer: Self-pay | Admitting: Internal Medicine

## 2022-07-06 MED ORDER — LEVOTHYROXINE SODIUM 100 MCG PO TABS: 100.0000 ug | ORAL_TABLET | ORAL | 0 refills | Status: DC

## 2022-07-08 ENCOUNTER — Ambulatory Visit (INDEPENDENT_AMBULATORY_CARE_PROVIDER_SITE_OTHER): Payer: Medicare Other | Admitting: Psychiatry

## 2022-07-08 ENCOUNTER — Ambulatory Visit (HOSPITAL_COMMUNITY): Payer: Self-pay | Admitting: Clinical

## 2022-07-08 ENCOUNTER — Ambulatory Visit (HOSPITAL_COMMUNITY): Payer: Medicare Other | Admitting: Psychiatry

## 2022-07-08 DIAGNOSIS — F4323 Adjustment disorder with mixed anxiety and depressed mood: Secondary | ICD-10-CM

## 2022-07-09 ENCOUNTER — Encounter (HOSPITAL_COMMUNITY): Payer: Self-pay | Admitting: Psychiatry

## 2022-07-09 ENCOUNTER — Encounter: Payer: Self-pay | Admitting: Internal Medicine
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
## 2022-07-15 ENCOUNTER — Ambulatory Visit: Payer: Medicare Other | Attending: Internal Medicine | Admitting: Internal Medicine

## 2022-07-15 ENCOUNTER — Encounter: Payer: Self-pay | Admitting: Internal Medicine

## 2022-07-15 VITALS — BP 117/76 | HR 61 | Resp 12 | Ht 62.5 in | Wt 172.0 lb

## 2022-07-15 DIAGNOSIS — Z8679 Personal history of other diseases of the circulatory system: Secondary | ICD-10-CM

## 2022-07-15 DIAGNOSIS — M3500 Sicca syndrome, unspecified: Secondary | ICD-10-CM | POA: Diagnosis not present

## 2022-07-15 DIAGNOSIS — M329 Systemic lupus erythematosus, unspecified: Secondary | ICD-10-CM

## 2022-07-15 NOTE — Progress Notes (Signed)
Office Visit Note  Patient: Katie Davis             Date of Birth: July 01, 1955           MRN: [id number]             PCP: Billie Lade, MD Referring: Billie Lade, MD Visit Date: 07/15/2022   Subjective:  New Patient (Initial Visit) (Patient states she is not on medications for autoimmune. Patient states she has costal chondritis. Patient states she is constantly tired.)   History of Present Illness: Katie Davis is a 67 y.o. female here to establish care with history of SLE and sjogren syndrome.  Also with history of celiac disease and with Graves' disease status post thyroid ablation.  She previously saw rheumatology in [place] years ago but was never on any long-term DMARD.  She has a number of associated symptoms lupus was characterized by joint pain, costochondritis, photosensitivity, lymphadenopathy no history of nephritis.  Sjogren's syndrome with chronic dry eye and mouth with associated dental complications TMJ pain but no ulcers or lesions.  She currently treats this with Systane eyedrops and more recently prescribed Miebo with her eye doctor. Previously had left [facility] joint surgery due to osteoarthritis and fall injury.  She is still having pretty frequent chest wall pain sometimes occurring on its own without other systemic inflammatory symptoms.  Her other biggest complaint today is just the severe persistent fatigue feels tired all the time independent of sleep duration. Raynaud's symptoms slightly better in warmer climate although still easily experiences such as reaching into freezer or under air conditioning air flow.    Activities of Daily Living:  Patient reports morning stiffness for 5-15 minutes.   Patient Reports nocturnal pain.  Difficulty dressing/grooming: Denies Difficulty climbing stairs: Denies Difficulty getting out of chair: Denies Difficulty using hands for taps, buttons, cutlery, and/or writing: Reports  Review of Systems   Constitutional:  Positive for fatigue.  HENT:  Positive for mouth dryness. Negative for mouth sores.   Eyes:  Positive for dryness.  Respiratory:  Negative for shortness of breath.   Cardiovascular:  Positive for palpitations. Negative for chest pain.  Gastrointestinal:  Positive for constipation and diarrhea. Negative for blood in stool.  Endocrine: Negative for increased urination.  Genitourinary:  Negative for involuntary urination.  Musculoskeletal:  Positive for joint pain, joint pain, joint swelling, myalgias, muscle weakness, morning stiffness, muscle tenderness and myalgias. Negative for gait problem.  Skin:  Positive for hair loss and sensitivity to sunlight. Negative for color change and rash.  Allergic/Immunologic: Negative for susceptible to infections.  Neurological:  Positive for dizziness and headaches.  Hematological:  Negative for swollen glands.  Psychiatric/Behavioral:  Positive for sleep disturbance. Negative for depressed mood. The patient is nervous/anxious.     PMFS History:  Patient Active Problem List   Diagnosis Date Noted   Caregiver stress 04/21/2022   Prediabetes 02/25/2022   Hyperlipidemia 02/25/2022   Hypothyroidism 02/25/2022   IBS (irritable bowel syndrome) 02/25/2022   History of Raynaud's syndrome 02/25/2022   Severe persistent asthma 02/25/2022   History of Sjogren's disease (HCC) 02/25/2022   History of systemic lupus erythematosus (SLE) (HCC) 02/25/2022   Osteoporosis 02/25/2022   Gastroesophageal reflux disease 01/30/2021   Early satiety 01/30/2021   Bloating 01/30/2021   Celiac disease    Superior mesenteric artery stenosis (HCC)    DOE (dyspnea on exertion) 12/05/2020   Alternating constipation and diarrhea 05/07/2020   Encounter for general adult medical  examination with abnormal findings 05/07/2020   Nausea without vomiting 05/07/2020   Generalized abdominal pain 10/24/2019   Postmenopausal 12/14/2018   Vasomotor symptoms due to  menopause 12/14/2018   History of abnormal cervical Pap smear 12/14/2018    Past Medical History:  Diagnosis Date   Abnormal glandular Papanicolaou smear of cervix    Allergy 2008   Latex ..after surgery   Anxiety    Asthma    Body mass index 30.0-30.9, adult    Celiac disease    Chronic vascular disorder of intestine (HCC)    Encounter for general adult medical examination with abnormal findings 05/07/2020   Fibromyalgia    GERD (gastroesophageal reflux disease) 14 years ago   Graves disease    Headache    Migrains   Hyperlipidemia    Hypertension    Hypothyroidism    IBS (irritable bowel syndrome) With constipation    ILD (interstitial lung disease) (HCC)    Lupus (systemic lupus erythematosus) (HCC)    Obesity, unspecified    Osteoporosis    Raynaud disease    Sjogren's disease (HCC)    Type 2 diabetes mellitus without complications (HCC) 02/25/2022    Family History  Problem Relation Age of Onset   Congestive Heart Failure Mother    Arthritis Mother    Aneurysm Father    Celiac disease Brother    Melanoma Brother    Aneurysm Brother        stomach   Asthma Brother    Cancer Brother    Stroke Sister    Celiac disease Sister    Irritable bowel syndrome Sister    Cancer Sister    Celiac disease Brother    Aneurysm Sister    Other Sister        multiple back surgeries   Colon cancer Neg Hx    Past Surgical History:  Procedure Laterality Date   APPENDECTOMY     CHOLECYSTECTOMY     COLONOSCOPY WITH PROPOFOL N/A 06/10/2020   Surgeon: Lanelle Bal, DO;  diverticulosis and nonbleeding internal hemorrhoids, due for repeat in 2032.   DG THUMB RIGHT HAND ([facility] HX)     EYE SURGERY  2019   Cataract. Right eye   little finger Right    placement of pin   OTHER SURGICAL HISTORY     biopsies on both feet   REDUCTION MAMMAPLASTY     Social History   Social History Narrative   Not on file   Immunization History  Administered Date(s) Administered    Comptroller (J&J) SARS-COV-2 Vaccination 06/21/2019   PFIZER(Purple Top)SARS-COV-2 Vaccination 04/22/2020, 10/31/2020, 03/02/2022   Respiratory Syncytial Virus Vaccine,Recomb Aduvanted(Arexvy) 04/02/2022   Unspecified SARS-COV-2 Vaccination 06/24/2019     Objective: Vital Signs: BP 117/76 (BP Location: Right Arm, Patient Position: Sitting, Cuff Size: Normal)   Pulse 61   Resp 12   Ht 5' 2.5" (1.588 m)   Wt 172 lb (78 kg)   BMI 30.96 kg/m    Physical Exam HENT:     Mouth/Throat:     Mouth: Mucous membranes are dry.     Pharynx: Oropharynx is clear.     Comments: Loss of lingual papillae No ulcers or lesions Eyes:     Comments: Slight conjunctival erythema, no periorbital swelling  Cardiovascular:     Rate and Rhythm: Normal rate and regular rhythm.     Heart sounds: Murmur heard.  Pulmonary:     Effort: Pulmonary effort is normal.     Breath  sounds: Normal breath sounds.  Musculoskeletal:     Right lower leg: No edema.     Left lower leg: No edema.  Lymphadenopathy:     Cervical: No cervical adenopathy.  Skin:    Findings: No rash.     Comments: Normal appearing nailfold capillaries  Neurological:     Mental Status: She is alert.  Psychiatric:        Mood and Affect: Mood normal.      Musculoskeletal Exam:  Shoulders full ROM no tenderness or swelling Elbows full ROM no tenderness or swelling Wrists full ROM no tenderness or swelling Fingers Heberden's nodes throughout both hands decreased flexion range of motion, few with slight distal lateral deviation Hip internal rotation range of motion slightly decreased bilaterally, mild tenderness to pressure laterally Knees full ROM no tenderness or swelling bilateral patellofemoral crepitus   Investigation: No additional findings.  Imaging: No results found.  Recent Labs: Lab Results  Component Value Date   WBC 4.1 03/18/2022   HGB 13.8 03/18/2022   PLT 207 03/18/2022   NA 141 03/18/2022   K 3.8 03/18/2022   CL  104 03/18/2022   CO2 23 03/18/2022   GLUCOSE 101 (H) 03/18/2022   BUN 14 03/18/2022   CREATININE 0.69 03/18/2022   BILITOT 0.3 03/18/2022   ALKPHOS 73 03/18/2022   AST 17 03/18/2022   ALT 15 03/18/2022   PROT 7.2 03/18/2022   ALBUMIN 4.2 03/18/2022   CALCIUM 9.8 03/18/2022    Speciality Comments: No specialty comments available.  Procedures:  No procedures performed Allergies: Prednisone, Azithromycin, Bactrim [sulfamethoxazole-trimethoprim], Codeine, Denosumab, Gluten meal, Influenza vaccines, Keflex [cephalexin], Levaquin [levofloxacin], Sulfa antibiotics, Contrast media [iodinated contrast media], Doxycycline, and Latex   Assessment / Plan:     Visit Diagnoses: History of systemic lupus erythematosus (SLE) (HCC) - Plan: ANA, RNP Antibody, Anti-Smith antibody, Sjogrens syndrome-A extractable nuclear antibody, Sjogrens syndrome-B extractable nuclear antibody, Anti-DNA antibody, double-stranded, C3 and C4, Sedimentation rate, Protein / creatinine ratio, urine, Rheumatoid factor, IgG, IgA, IgM  History of systemic lupus current exam is pretty benign with no peripheral joint synovitis active skin lesions mucosal ulcers or adenopathy.  Will check extensive antibody panel starting with ANA all specific antibodies urine screening rheumatoid factor complements and immunoglobulin.  If abnormal lab results give low threshold to try adding rheumatic DMARD medication such as hydroxychloroquine.  If results entirely unremarkable may suggest more supportive treatment consider more of chronic pain or myofascial pain syndrome and symptomatic treatment for the dryness and Raynaud's.  History of Sjogren's disease (HCC)  Complaining of significant dryness involving eyes mouth also widespread with skin involvement dryness and frequently itchy without any localized lesions.  Currently just on supportive treatments with hydration and using topical emollient  No salivary gland or cervical lymph node swelling  on exam or reported by patient.  Lab workup as above.  History of Raynaud's syndrome  Not in current exacerbation no evidence of digital ischemia or residual injuries on exam today.  Normal nailfold capillaroscopy so not clear if related to connective tissue disease or independent problem.  Seems to doing adequately just with cold avoidance.  Orders: Orders Placed This Encounter  Procedures   ANA   RNP Antibody   Anti-Smith antibody   Sjogrens syndrome-A extractable nuclear antibody   Sjogrens syndrome-B extractable nuclear antibody   Anti-DNA antibody, double-stranded   C3 and C4   Sedimentation rate   Protein / creatinine ratio, urine   Rheumatoid factor   IgG, IgA, IgM  No orders of the defined types were placed in this encounter.    Follow-Up Instructions: Return in about 2 weeks (around 07/29/2022) for New pt SLE/SS f/u 2wks.   Fuller Plan, MD  Note - This record has been created using [employer].  Chart creation errors have been sought, but may not always  have been located. Such creation errors do not reflect on  the standard of medical care.

## 2022-07-16 ENCOUNTER — Telehealth: Payer: Self-pay

## 2022-07-16 LAB — RHEUMATOID FACTOR: Rheumatoid fact SerPl-aCnc: <10 IU/mL (ref <14)

## 2022-07-16 LAB — SJOGRENS SYNDROME-B EXTRACTABLE NUCLEAR ANTIBODY: SSB (La) (ENA) Antibody, IgG: <1 AI

## 2022-07-16 LAB — RNP ANTIBODY: Ribonucleic Protein(ENA) Antibody, IgG: <1 AI

## 2022-07-16 LAB — IGG, IGA, IGM: Immunoglobulin A: 279 mg/dL (ref 70–320)

## 2022-07-16 NOTE — Telephone Encounter (Signed)
Patient contacted the office to inquire if thyroid, CBC, and CMP labs can be added on to her existing labs from yesterday or if she should just go ahead and get them drawn at her PCP office. Advised patient to go ahead and get them drawn at her PCP incase we do not have the tubes to add those tests on. Patient verbalized understanding.

## 2022-07-17 ENCOUNTER — Other Ambulatory Visit: Payer: Self-pay | Admitting: Internal Medicine

## 2022-07-17 MED ORDER — CLONAZEPAM 0.5 MG PO TABS: 0.2500 mg | ORAL_TABLET | Freq: Two times a day (BID) | ORAL | 0 refills | Status: DC | PRN

## 2022-07-18 LAB — SJOGRENS SYNDROME-A EXTRACTABLE NUCLEAR ANTIBODY: SSA (Ro) (ENA) Antibody, IgG: <1 AI

## 2022-07-18 LAB — ANTI-NUCLEAR AB-TITER (ANA TITER)
ANA TITER: >=1:1280 {titer} — AB
ANA Titer 1: 1:320 {titer} — ABNORMAL HIGH

## 2022-07-18 LAB — ANTI-DNA ANTIBODY, DOUBLE-STRANDED: ds DNA Ab: 1 IU/mL

## 2022-07-18 LAB — PROTEIN / CREATININE RATIO, URINE
Creatinine, Urine: 154 mg/dL (ref 20–275)
Protein/Creat Ratio: 58 mg/g creat (ref 24–184)
Protein/Creatinine Ratio: 0.058 mg/mg creat (ref 0.024–0.184)
Total Protein, Urine: 9 mg/dL (ref 5–24)

## 2022-07-18 LAB — IGG, IGA, IGM
IgG (Immunoglobin G), Serum: 1514 mg/dL (ref 600–1540)
IgM, Serum: 53 mg/dL (ref 50–300)

## 2022-07-18 LAB — SEDIMENTATION RATE: Sed Rate: 17 mm/h (ref 0–30)

## 2022-07-18 LAB — ANTI-SMITH ANTIBODY: ENA SM Ab Ser-aCnc: <1 AI

## 2022-07-18 LAB — ANA: Anti Nuclear Antibody (ANA): POSITIVE — AB

## 2022-07-18 LAB — C3 AND C4
C3 Complement: 140 mg/dL (ref 83–193)
C4 Complement: 15 mg/dL (ref 15–57)

## 2022-07-20 ENCOUNTER — Encounter: Payer: Self-pay | Admitting: Internal Medicine

## 2022-07-20 ENCOUNTER — Ambulatory Visit (INDEPENDENT_AMBULATORY_CARE_PROVIDER_SITE_OTHER): Payer: Medicare Other | Admitting: Internal Medicine

## 2022-07-20 VITALS — BP 128/74 | HR 69 | Ht 63.0 in | Wt 171.8 lb

## 2022-07-20 DIAGNOSIS — E782 Mixed hyperlipidemia: Secondary | ICD-10-CM

## 2022-07-20 DIAGNOSIS — Z636 Dependent relative needing care at home: Secondary | ICD-10-CM

## 2022-07-20 NOTE — Assessment & Plan Note (Signed)
Lipid panel updated in January.  Total cholesterol 166 and LDL 99.  Her 10-year ASCVD risk is 16.1%.  She is currently prescribed rosuvastatin 5 mg daily but has most recently been taking it only 4 times per week.  She is also on fish oil supplementation.  Katie Davis reports today that she is gradually increasing the frequency at which she takes rosuvastatin with a goal of taking it on a daily basis. -No medication changes today. -We will plan for repeat lipid panel at follow-up in 6 months

## 2022-07-20 NOTE — Assessment & Plan Note (Signed)
She has establish care with counseling and feels that this has been very beneficial.  She is attending the gym regularly and also feels that this has been beneficial in managing her stress and improving her outlook.

## 2022-07-20 NOTE — Patient Instructions (Signed)
It was a pleasure to see you today.  Thank you for giving us the opportunity to be involved in your care.  Below is a brief recap of your visit and next steps.  We will plan to see you again in 6 months.  Summary No medication changes today We will plan for follow up in 6 months  

## 2022-07-20 NOTE — Progress Notes (Signed)
Established Patient Office Visit  Subjective   Patient ID: Katie Davis, female    DOB: 28-Jan-1956  Age: 67 y.o. MRN: [id number]  Chief Complaint  Patient presents with   Hypothyroidism    Follow up   Katie Davis returns to care today for follow-up.  She was last evaluated by me on 2/6.  She was referred to psychiatry at that time after endorsing significant caregiver stress.  No additional changes were made.  In the interim she has been seen by rheumatology, pulmonology, and cardiology.  She has also establish care with counseling services.  Katie Davis reports feeling well today.  She is asymptomatic and has no acute concerns to discuss.  She states that she is pleased with the providers that she has establish care with and feels that counseling has been very beneficial.  Past Medical History:  Diagnosis Date   Abnormal glandular Papanicolaou smear of cervix    Allergy 2008   Latex ..after surgery   Anxiety    Asthma    Body mass index 30.0-30.9, adult    Celiac disease    Chronic vascular disorder of intestine (HCC)    Encounter for general adult medical examination with abnormal findings 05/07/2020   Fibromyalgia    GERD (gastroesophageal reflux disease) 14 years ago   Graves disease    Headache    Migrains   Hyperlipidemia    Hypertension    Hypothyroidism    IBS (irritable bowel syndrome) With constipation    ILD (interstitial lung disease) (HCC)    Lupus (systemic lupus erythematosus) (HCC)    Obesity, unspecified    Osteoporosis    Raynaud disease    Sjogren's disease (HCC)    Type 2 diabetes mellitus without complications (HCC) 02/25/2022   Past Surgical History:  Procedure Laterality Date   APPENDECTOMY     CHOLECYSTECTOMY     COLONOSCOPY WITH PROPOFOL N/A 06/10/2020   Surgeon: Lanelle Bal, DO;  diverticulosis and nonbleeding internal hemorrhoids, due for repeat in 2032.   DG THUMB RIGHT HAND ([facility] HX)     EYE SURGERY  2019   Cataract. Right eye    little finger Right    placement of pin   OTHER SURGICAL HISTORY     biopsies on both feet   REDUCTION MAMMAPLASTY     Social History   Tobacco Use   Smoking status: Never    Passive exposure: Never   Smokeless tobacco: Never  Vaping Use   Vaping Use: Never used  Substance Use Topics   Alcohol use: Not Currently    Comment: wine occ   Drug use: Never   Family History  Problem Relation Age of Onset   Congestive Heart Failure Mother    Arthritis Mother    Aneurysm Father    Celiac disease Brother    Melanoma Brother    Aneurysm Brother        stomach   Asthma Brother    Cancer Brother    Stroke Sister    Celiac disease Sister    Irritable bowel syndrome Sister    Cancer Sister    Celiac disease Brother    Aneurysm Sister    Other Sister        multiple back surgeries   Colon cancer Neg Hx    Allergies  Allergen Reactions   Prednisone Palpitations   Azithromycin     vomiting   Bactrim [Sulfamethoxazole-Trimethoprim] Nausea And Vomiting    Migraines    Codeine  Nausea And Vomiting   Denosumab     Other reaction(s): Unknown   Gluten Meal Other (See Comments)    Ceiliac disease   Influenza Vaccines Swelling   Keflex [Cephalexin] Nausea And Vomiting   Levaquin [Levofloxacin] Nausea And Vomiting   Sulfa Antibiotics Nausea And Vomiting   Contrast Media [Iodinated Contrast Media] Nausea And Vomiting and Rash    Per patient, did fine with premedication with IV steroids before    Doxycycline Palpitations    Made body feel crazy, increased heart rate   Latex Hives and Rash   Review of Systems  Constitutional:  Negative for chills and fever.  HENT:  Negative for sore throat.   Respiratory:  Negative for cough and shortness of breath.   Cardiovascular:  Negative for chest pain, palpitations and leg swelling.  Gastrointestinal:  Negative for abdominal pain, blood in stool, constipation, diarrhea, nausea and vomiting.  Genitourinary:  Negative for dysuria and  hematuria.  Musculoskeletal:  Negative for myalgias.  Skin:  Negative for itching and rash.  Neurological:  Negative for dizziness and headaches.  Psychiatric/Behavioral:  Negative for depression and suicidal ideas.       Objective:     BP 128/74   Pulse 69   Ht 5\' 3"  (1.6 m)   Wt 171 lb 12.8 oz (77.9 kg)   SpO2 95%   BMI 30.43 kg/m  BP Readings from Last 3 Encounters:  07/20/22 128/74  07/15/22 117/76  06/25/22 110/66   Physical Exam Vitals reviewed.  Constitutional:      General: She is not in acute distress.    Appearance: Normal appearance. She is not toxic-appearing.  HENT:     Head: Normocephalic and atraumatic.     Right Ear: External ear normal.     Left Ear: External ear normal.     Nose: Nose normal. No congestion or rhinorrhea.     Mouth/Throat:     Mouth: Mucous membranes are moist.     Pharynx: Oropharynx is clear. No oropharyngeal exudate or posterior oropharyngeal erythema.  Eyes:     General: No scleral icterus.    Extraocular Movements: Extraocular movements intact.     Conjunctiva/sclera: Conjunctivae normal.     Pupils: Pupils are equal, round, and reactive to light.  Cardiovascular:     Rate and Rhythm: Normal rate and regular rhythm.     Pulses: Normal pulses.     Heart sounds: Normal heart sounds. No murmur heard.    No friction rub. No gallop.  Pulmonary:     Effort: Pulmonary effort is normal.     Breath sounds: Normal breath sounds. No wheezing, rhonchi or rales.  Abdominal:     General: Abdomen is flat. Bowel sounds are normal. There is no distension.     Palpations: Abdomen is soft.     Tenderness: There is no abdominal tenderness.  Musculoskeletal:        General: No swelling. Normal range of motion.     Cervical back: Normal range of motion.     Right lower leg: No edema.     Left lower leg: No edema.  Lymphadenopathy:     Cervical: No cervical adenopathy.  Skin:    General: Skin is warm and dry.     Capillary Refill:  Capillary refill takes less than 2 seconds.     Coloration: Skin is not jaundiced.  Neurological:     General: No focal deficit present.     Mental Status: She is alert and  oriented to person, place, and time.  Psychiatric:        Mood and Affect: Mood normal.        Behavior: Behavior normal.   Last CBC Lab Results  Component Value Date   WBC 4.1 03/18/2022   HGB 13.8 03/18/2022   HCT 40.8 03/18/2022   MCV 93 03/18/2022   MCH 31.4 03/18/2022   RDW 13.5 03/18/2022   PLT 207 03/18/2022   Last metabolic panel Lab Results  Component Value Date   GLUCOSE 101 (H) 03/18/2022   NA 141 03/18/2022   K 3.8 03/18/2022   CL 104 03/18/2022   CO2 23 03/18/2022   BUN 14 03/18/2022   CREATININE 0.69 03/18/2022   EGFR 96 03/18/2022   CALCIUM 9.8 03/18/2022   PROT 7.2 03/18/2022   ALBUMIN 4.2 03/18/2022   LABGLOB 3.0 03/18/2022   AGRATIO 1.4 03/18/2022   BILITOT 0.3 03/18/2022   ALKPHOS 73 03/18/2022   AST 17 03/18/2022   ALT 15 03/18/2022   Last lipids Lab Results  Component Value Date   CHOL 166 03/18/2022   HDL 38 (L) 03/18/2022   LDLCALC 92 03/18/2022   TRIG 214 (H) 03/18/2022   CHOLHDL 4.4 03/18/2022   Last hemoglobin A1c Lab Results  Component Value Date   HGBA1C 5.7 (H) 03/18/2022   Last thyroid functions Lab Results  Component Value Date   TSH 1.620 06/16/2022   Last vitamin D Lab Results  Component Value Date   VD25OH 46.9 03/18/2022   Last vitamin B12 and Folate Lab Results  Component Value Date   VITAMINB12 444 03/18/2022   FOLATE 5.8 03/18/2022   The 10-year ASCVD risk score (Arnett DK, et al., 2019) is: 16.1%    Assessment & Plan:   Problem List Items Addressed This Visit       Hyperlipidemia - Primary    Lipid panel updated in January.  Total cholesterol 166 and LDL 99.  Her 10-year ASCVD risk is 16.1%.  She is currently prescribed rosuvastatin 5 mg daily but has most recently been taking it only 4 times per week.  She is also on fish oil  supplementation.  Ms. Woolcott reports today that she is gradually increasing the frequency at which she takes rosuvastatin with a goal of taking it on a daily basis. -No medication changes today. -We will plan for repeat lipid panel at follow-up in 6 months      Caregiver stress    She has establish care with counseling and feels that this has been very beneficial.  She is attending the gym regularly and also feels that this has been beneficial in managing her stress and improving her outlook.       Return in about 6 months (around 01/20/2023).    Billie Lade, MD

## 2022-07-22 ENCOUNTER — Encounter: Payer: Self-pay | Admitting: Internal Medicine

## 2022-07-28 DIAGNOSIS — H04123 Dry eye syndrome of bilateral lacrimal glands: Secondary | ICD-10-CM | POA: Diagnosis not present

## 2022-07-28 DIAGNOSIS — H25812 Combined forms of age-related cataract, left eye: Secondary | ICD-10-CM | POA: Diagnosis not present

## 2022-07-30 ENCOUNTER — Ambulatory Visit (INDEPENDENT_AMBULATORY_CARE_PROVIDER_SITE_OTHER): Payer: Medicare Other | Admitting: Obstetrics & Gynecology

## 2022-07-30 ENCOUNTER — Encounter: Payer: Self-pay | Admitting: Internal Medicine

## 2022-07-30 ENCOUNTER — Other Ambulatory Visit (HOSPITAL_COMMUNITY)
Admission: RE | Admit: 2022-07-30 | Discharge: 2022-07-30 | Disposition: A | Discharge status: Home / Self Care | Payer: Medicare Other | Source: Ambulatory Visit | Attending: Obstetrics & Gynecology | Admitting: Obstetrics & Gynecology

## 2022-07-30 ENCOUNTER — Telehealth: Payer: Medicare Other | Admitting: Internal Medicine

## 2022-07-30 ENCOUNTER — Encounter: Payer: Self-pay | Admitting: Obstetrics & Gynecology

## 2022-07-30 VITALS — BP 114/69 | HR 62 | Ht 63.0 in | Wt 172.8 lb

## 2022-07-30 DIAGNOSIS — Z01419 Encounter for gynecological examination (general) (routine) without abnormal findings: Secondary | ICD-10-CM | POA: Diagnosis not present

## 2022-07-30 DIAGNOSIS — M329 Systemic lupus erythematosus, unspecified: Secondary | ICD-10-CM | POA: Diagnosis not present

## 2022-07-30 DIAGNOSIS — Z1151 Encounter for screening for human papillomavirus (HPV): Secondary | ICD-10-CM | POA: Insufficient documentation

## 2022-07-30 DIAGNOSIS — Z8619 Personal history of other infectious and parasitic diseases: Secondary | ICD-10-CM | POA: Diagnosis not present

## 2022-07-30 DIAGNOSIS — Z636 Dependent relative needing care at home: Secondary | ICD-10-CM | POA: Diagnosis not present

## 2022-07-30 DIAGNOSIS — M797 Fibromyalgia: Secondary | ICD-10-CM

## 2022-07-30 DIAGNOSIS — Z9889 Other specified postprocedural states: Secondary | ICD-10-CM | POA: Diagnosis not present

## 2022-07-30 DIAGNOSIS — M3500 Sicca syndrome, unspecified: Secondary | ICD-10-CM | POA: Diagnosis not present

## 2022-07-30 MED ORDER — DULOXETINE HCL 20 MG PO CPEP
20.0000 mg | ORAL_CAPSULE | Freq: Every day | ORAL | 2 refills | Status: DC
Start: 2022-07-30 — End: 2023-01-27

## 2022-07-30 NOTE — Progress Notes (Signed)
WELL-WOMAN EXAMINATION Patient name: Katie Davis MRN [id number]  Date of birth: [birth year] Chief Complaint:   Gynecologic Exam  History of Present Illness:   Katie Davis is a 67 y.o. G3P0030 PM female being seen today for a routine well-woman exam.   Today she notes no acute complaints or concerns   History of cervical dysplasia-laser completed 2017  Denies vaginal bleeding discharge or irritation.  Not sexually active.  Notes occasional pelvic pain, but she thinks this is likely related to her celiac disease and fibromyalgia.  No LMP recorded. Patient is postmenopausal.  Last pap 2023 negative 2021- HPV + laser 2017 .  Last mammogram: 10\2023. Last colonoscopy: 2022     07/30/2022    9:38 AM 07/20/2022   10:30 AM 07/08/2022    3:23 PM 04/21/2022    2:25 PM 02/19/2022    9:11 AM  Depression screen PHQ 2/9  Decreased Interest 0 1  1 0  Down, Depressed, Hopeless 0 1  0 1  PHQ - 2 Score 0 2  1 1   Altered sleeping 0 1  1   Tired, decreased energy 1 1  1    Change in appetite 1 0  0   Feeling bad or failure about yourself  0 0  0   Trouble concentrating 0 1  0   Moving slowly or fidgety/restless 0 0  0   Suicidal thoughts 0 0  0   PHQ-9 Score 2 5  3       Information is confidential and restricted. Go to Review Flowsheets to unlock data.      Review of Systems:   Pertinent items are noted in HPI Denies any headaches, blurred vision, fatigue, shortness of breath, chest pain, abdominal pain, urination, or intercourse unless otherwise stated above.  Pertinent History Reviewed:  Reviewed past medical,surgical, social and family history.  Reviewed problem list, medications and allergies. Physical Assessment:   Vitals:   07/30/22 0938  BP: 114/69  Pulse: 62  Weight: 172 lb 12.8 oz (78.4 kg)  Height: 5\' 3"  (1.6 m)  Body mass index is 30.61 kg/m.        Physical Examination:   General appearance - well appearing, and in no distress  Mental status - alert, oriented  to person, place, and time  Psych:  She has a normal mood and affect  Skin - warm and dry, normal color, no suspicious lesions noted  Chest - effort normal, all lung fields clear to auscultation bilaterally  Heart - normal rate and regular rhythm  Neck:  midline trachea, no thyromegaly or nodules  Breasts - breasts appear normal, no suspicious masses, no skin or nipple changes or  axillary nodes  Abdomen - soft, nontender, nondistended, no masses or organomegaly  Pelvic - VULVA: normal appearing vulva with no masses, tenderness or lesions  VAGINA: normal appearing vagina with normal color and discharge, no lesions  CERVIX: normal appearing cervix without discharge or lesions, no CMT  Thin prep pap is done with HR HPV cotesting  UTERUS: uterus is felt to be normal size, shape, consistency and nontender   ADNEXA: No adnexal masses or tenderness noted.  Extremities:  No swelling or varicosities noted  Chaperone: Faith Rogue     Assessment & Plan:  1) Well-Woman Exam -Pap collected due to prior history of HPV positive -Mammogram and colonoscopy up-to-date   Meds: No orders of the defined types were placed in this encounter.   Follow-up: Return in about 1 year (around 07/30/2023)  for Annual.   Myna Hidalgo, DO Attending Obstetrician & Gynecologist, [facility], [facility]

## 2022-07-30 NOTE — Addendum Note (Signed)
Addended by: Annamarie Dawley on: 07/30/2022 02:41 PM   Modules accepted: Orders

## 2022-08-04 ENCOUNTER — Ambulatory Visit (HOSPITAL_COMMUNITY): Payer: Medicare Other | Admitting: Psychiatry

## 2022-08-04 DIAGNOSIS — F4323 Adjustment disorder with mixed anxiety and depressed mood: Secondary | ICD-10-CM

## 2022-08-04 NOTE — Progress Notes (Signed)
IN- PERSON  THERAPIST PROGRESS NOTE  Session Time:  Tuesday 08/04/2022 11:12 AM - 12:05 AM   Participation Level: Active  Behavioral Response: CasualAlert/sad/tearful  Type of Therapy: Individual Therapy  Treatment Goals addressed: Establish rapport, process grief and loss issues  ProgressTowards Goals: will develop formal treatment plan next session   Interventions: Supportive  Summary: Katie Davis is a 67 y.o. female who is referred for services by PCP Dr. Durwin Nora due to pt experiencing symptoms of anxiety. She denies any psychiatric hospitalization. She participated in outpatient therapy briefly about 20 years ago when she was first diagnosed with health issues related to autoimmune disease.  Patient states needing new tools to cope with life changes as her husband had 2 strokes about 3 years ago.  Her husband's functioning has changed significantly and has had a great impact on their relationship.  Patient reports grief and loss related to her husband's changed functioning.  She reports additional stress related to both she and her husband now are on fixed incomes as they are both disabled.  She also reports continued adjustment issues to move into the local area from [place] 7 years ago.  Patient last was seen about 3 to 4 weeks ago for the assessment appointment.  She reports little to no changes in her symptoms.  She shares more information today regarding husband having stroke and the effects on his current functioning as well as their marriage.  Patient reports continued stress regarding adjusting.  She begins to verbalize changes/losses in the relationship.  Patient also reports stress regarding limited support system as her family remains in [place].  Suicidal/Homicidal: Nowithout intent/plan  Therapist Response: Reviewed symptoms, discussed stressors, facilitated expression of thoughts and feelings, validated feelings, began to assist patient identify ways her life has  changed since her husband's stroke, began to assist patient verbalize feelings of anger and disappointment, began to identify goals for treatment, Inc. discussed previous relaxation techniques patient has used in the past, encouraged to resume use, also began to assist patient identify realistic expectations of self regarding being husband's caretaker, encouraged patient to schedule time for self and continue going to the gym  Plan: Return again in 2 weeks.  Diagnosis: Adjustment disorder with mixed anxiety and depressed mood  Collaboration of Care: Primary care provider as patient works with PCP Dr. Durwin Nora  Patient/Guardian was advised Release of Information must be obtained prior to any record release in order to collaborate their care with an outside provider. Patient/Guardian was advised if they have not already done so to contact the registration department to sign all necessary forms in order for [place] to release information regarding their care.   Consent: Patient/Guardian gives verbal consent for treatment and assignment of benefits for services provided during this visit. Patient/Guardian expressed understanding and agreed to proceed.   Adah Salvage, LCSW 08/04/2022

## 2022-08-06 LAB — CYTOLOGY - PAP
Comment: NEGATIVE
Comment: NEGATIVE
Comment: NEGATIVE
Diagnosis: NEGATIVE
Diagnosis: REACTIVE
HPV 16: NEGATIVE
HPV 18 / 45: NEGATIVE
High risk HPV: POSITIVE — AB

## 2022-08-07 ENCOUNTER — Encounter: Payer: Self-pay | Admitting: Obstetrics & Gynecology

## 2022-08-11 ENCOUNTER — Telehealth: Payer: Self-pay | Admitting: Obstetrics & Gynecology

## 2022-08-11 NOTE — Telephone Encounter (Signed)
I called patient to set up her colpo. I've got her scheduled on 5/30 @ 8:30 am. She stated she's had paps and had biopsies for over 20 years for abnormal results. She said she had talked to you some when she came in about surgery. Now she's just wondering if she should get scheduled for surgery instead of doing more procedures. Please advise.

## 2022-08-13 ENCOUNTER — Encounter: Payer: Self-pay | Admitting: Obstetrics & Gynecology

## 2022-08-13 ENCOUNTER — Other Ambulatory Visit (HOSPITAL_COMMUNITY)
Admission: RE | Admit: 2022-08-13 | Discharge: 2022-08-13 | Disposition: A | Discharge status: Home / Self Care | Payer: Medicare Other | Source: Ambulatory Visit | Attending: Obstetrics & Gynecology | Admitting: Obstetrics & Gynecology

## 2022-08-13 ENCOUNTER — Ambulatory Visit (INDEPENDENT_AMBULATORY_CARE_PROVIDER_SITE_OTHER): Payer: Medicare Other | Admitting: Obstetrics & Gynecology

## 2022-08-13 VITALS — BP 136/76 | HR 71 | Ht 63.0 in | Wt 170.8 lb

## 2022-08-13 DIAGNOSIS — N952 Postmenopausal atrophic vaginitis: Secondary | ICD-10-CM | POA: Diagnosis not present

## 2022-08-13 DIAGNOSIS — R87618 Other abnormal cytological findings on specimens from cervix uteri: Secondary | ICD-10-CM | POA: Insufficient documentation

## 2022-08-13 MED ORDER — ESTRADIOL 0.1 MG/GM VA CREA
TOPICAL_CREAM | VAGINAL | 3 refills | Status: DC
Start: 2022-08-13 — End: 2023-04-21

## 2022-08-13 NOTE — Progress Notes (Signed)
Patient name: Katie Davis MRN [id number]  Date of birth: 1955-08-06 Chief Complaint:   Colposcopy  History of Present Illness:   Katie Davis is a 66 y.o. G3P0030 PM female being seen today for cervical dysplasia management.  Recent cytology: Pap neg, HPV positive other Patient reports longstanding history of cervical dysplasia.  Notes history of cone in the past and low-grade dysplasia.  She is not currently sexually active.  Denies vaginal bleeding.  Denies discharge itching or irritation.  No acute complaints or changes since her last visit.      07/30/2022    9:38 AM 07/20/2022   10:30 AM 07/08/2022    3:23 PM 04/21/2022    2:25 PM 02/19/2022    9:11 AM  Depression screen PHQ 2/9  Decreased Interest 0 1  1 0  Down, Depressed, Hopeless 0 1  0 1  PHQ - 2 Score 0 2  1 1   Altered sleeping 0 1  1   Tired, decreased energy 1 1  1    Change in appetite 1 0  0   Feeling bad or failure about yourself  0 0  0   Trouble concentrating 0 1  0   Moving slowly or fidgety/restless 0 0  0   Suicidal thoughts 0 0  0   PHQ-9 Score 2 5  3       Information is confidential and restricted. Go to Review Flowsheets to unlock data.     Review of Systems:   Pertinent items are noted in HPI Denies fever/chills, dizziness, headaches, visual disturbances, fatigue, shortness of breath, chest pain, abdominal pain, vomiting, no problems with bowel movements, urination, or intercourse unless otherwise stated above.  Pertinent History Reviewed:  Reviewed past medical,surgical, social, obstetrical and family history.  Reviewed problem list, medications and allergies. Physical Assessment:   Vitals:   08/13/22 0838  BP: 136/76  Pulse: 71  Weight: 170 lb 12.8 oz (77.5 kg)  Height: 5\' 3"  (1.6 m)  Body mass index is 30.26 kg/m.       Physical Examination:   General appearance: alert, well appearing, and in no distress  Psych: mood appropriate, normal affect  Skin: warm & dry   Cardiovascular:  normal heart rate noted  Respiratory: normal respiratory effort, no distress  Abdomen: soft, non-tender   Pelvic: VULVA: normal appearing vulva with no masses, tenderness or lesions, VAGINA: normal appearing vagina with normal color and discharge, no lesions, CERVIX: atrophic changes noted, see below  Extremities: no edema   Chaperone: Faith Rogue     Colposcopy Procedure Note  Indications: HPV positive other    Procedure Details  The risks and benefits of the procedure and Written informed consent obtained.  Speculum placed in vagina and excellent visualization of cervix achieved, cervix swabbed x 3 with acetic acid solution.  Findings: Adequate colposcopy is noted today.  Atrophic changes, acetowhite changes appreciated.  No discrete lesion noted.  Cervix: ECC and cervical biopsies obtained.    Monsel's applied.  Adequate hemostasis noted  Specimens: ECC and cervical biopsies  Complications: none.  Colposcopic Impression: negative   Plan(Based on 2019 ASCCP recommendations)  -In light of persistent HPV without progression in the past, discussed potential long term conservative monitoring instead of following "strict" ASCCP guidelines -pt agreeable and would prefer to avoid surgery if possible -As above, inform consent obtained and procedure completed -biopsies obtained, further management pending results -Questions and concerns were addressed -f/u prn or likely in 35yr  -Vaginal atrophy Discussed  treatment with estrogen as potential option to "improve" cytology  Pt denies history of any of the following:  Untreated hypertension, active liver disease with abnormal liver function tests, active or recent arterial thromboembolic disease, or previous or current venous thromboembolism    Meds ordered this encounter  Medications   estradiol (ESTRACE VAGINAL) 0.1 MG/GM vaginal cream    Sig: Pea-sized amount once per week    Dispense:  42.5 g    Refill:  3      Myna Hidalgo, DO Attending Obstetrician & Gynecologist, [facility] [employer], [facility]

## 2022-08-14 NOTE — Progress Notes (Signed)
irtual Visit via Video Note  I connected with Katie Davis on 07/30/22 12:23 EDT by a video enabled telemedicine application and verified that I am speaking with the correct person using two identifiers.  Review of Systems    Eyes:  Negative for pain and discharge.  Respiratory:  Negative for cough and shortness of breath.   Cardiovascular:  Negative for chest pain, palpitations and leg swelling.  Gastrointestinal:  Negative for blood in stool, constipation and diarrhea.  Genitourinary:  Negative for frequency.  Musculoskeletal:  Positive for back pain, joint pain and myalgias. Negative for falls and neck pain.  Skin:  Negative for itching and rash.  Neurological:  Positive for dizziness and headaches. Negative for weakness.  Psychiatric/Behavioral: The patient is nervous/anxious.     Location: Patient: At home in [facility] Provider: At [facility] rheumatology clinic in [facility]   I discussed the limitations of evaluation and management by telemedicine and the availability of in person appointments. The patient expressed understanding and agreed to proceed.  History of Present Illness: Her last visit ongoing symptoms remain about the same her most significant problem is the severe ongoing fatigue.  Laboratory testing at initial visit redemonstrated the positive ANA but was otherwise negative for any specific antibody markers or markers of systemic inflammation.  Still gets some pain and sensitivity in multiple areas but not as problematic as the more generalized symptoms is not seeing any new joint swelling.  Feels her skin is just very sensitive all over and states super dry despite supportive treatment with topical emollients.  She still under a very heavy amount of stress acting as a primary caregiver.  Previous HPI 07/15/22 Katie Davis is a 67 y.o. female here to establish care with history of SLE and sjogren syndrome.  Also with history of celiac  disease and with Graves' disease status post thyroid ablation.  She previously saw rheumatology in [place] years ago but was never on any long-term DMARD.  She has a number of associated symptoms lupus was characterized by joint pain, costochondritis, photosensitivity, lymphadenopathy no history of nephritis.  Sjogren's syndrome with chronic dry eye and mouth with associated dental complications TMJ pain but no ulcers or lesions.  She currently treats this with Systane eyedrops and more recently prescribed Miebo with her eye doctor. Previously had left 1st [facility] joint surgery due to osteoarthritis and fall injury.  She is still having pretty frequent chest wall pain sometimes occurring on its own without other systemic inflammatory symptoms.  Her other biggest complaint today is just the severe persistent fatigue feels tired all the time independent of sleep duration. Raynaud's symptoms slightly better in warmer climate although still easily experiences such as reaching into freezer or under air conditioning air flow.    Observations/Objective: Physical Exam Eyes:     Conjunctiva/sclera: Conjunctivae normal.  Pulmonary:     Effort: Pulmonary effort is normal.  Neurological:     Mental Status: She is alert.  Psychiatric:        Mood and Affect: Mood normal.     Assessment and Plan: Fibromyalgia syndrome  At this time I think biggest contributor to her severe fatigue and generalized sensitivity is consistent with fibromyalgia syndrome.  Currently in exacerbation due to life stressors and sleep deprivation acting as a primary caregiver.  Discussed lifestyle management as an important component for this unfortunately cannot really pace herself or decrease responsibilities with no respite care option.  Will try addition of low-dose duloxetine 20  mg daily for widespread pain and sensitivity also see if this helps some with the fatigue and or anxiety.  Will keep at the low dose for a longer duration  before attempting to titrate she reports a lot of sensitivity to medications.  History of systemic lupus  Cannot confirm active systemic lupus at this time with unremarkable antibody markers and no other clinical criteria at present.  Can monitor going forwards but would not recommend any rheumatic DMARD treatment for this currently.  History of Sjogren syndrome  Discussed continue supportive care reviewed stepping up measures for supporting dry mouth symptoms including hydration use of mouthwashes also Biotene supplements or sugar-free gum and lozenges.  She is not interested in adding a secretagogue medication such as Salagen starting just 1 medicine at a time and reports preferring to take less drugs as she frequently gets side effects.  Follow Up Instructions:    I discussed the assessment and treatment plan with the patient. The patient was provided an opportunity to ask questions and all were answered. The patient agreed with the plan and demonstrated an understanding of the instructions.   The patient was advised to call back or seek an in-person evaluation if the symptoms worsen or if the condition fails to improve as anticipated.  I provided 11 minutes of non-face-to-face time during this encounter.  Dr. Sheliah Hatch

## 2022-08-17 ENCOUNTER — Encounter: Payer: Self-pay | Admitting: Internal Medicine

## 2022-08-17 ENCOUNTER — Encounter: Payer: Self-pay | Admitting: Obstetrics & Gynecology

## 2022-08-17 LAB — SURGICAL PATHOLOGY

## 2022-08-19 ENCOUNTER — Other Ambulatory Visit: Payer: Self-pay | Admitting: Internal Medicine

## 2022-08-20 MED ORDER — CLONAZEPAM 0.5 MG PO TABS: 0.2500 mg | ORAL_TABLET | Freq: Two times a day (BID) | ORAL | 0 refills | Status: DC | PRN

## 2022-08-24 ENCOUNTER — Encounter: Payer: Self-pay | Admitting: Pulmonary Disease

## 2022-08-24 MED ORDER — AZITHROMYCIN 250 MG PO TABS: ORAL_TABLET | ORAL | 0 refills | Status: AC

## 2022-08-24 NOTE — Telephone Encounter (Signed)
Please let her know I have sent is a script for a Zpak.    She should message later this week if not improving, and will schedule an ROV.

## 2022-08-25 ENCOUNTER — Encounter: Payer: Self-pay | Admitting: Cardiology

## 2022-08-25 ENCOUNTER — Encounter: Payer: Self-pay | Admitting: Internal Medicine

## 2022-08-26 ENCOUNTER — Encounter: Payer: Self-pay | Admitting: Obstetrics & Gynecology

## 2022-08-26 NOTE — Telephone Encounter (Signed)
Okay to switch back to breo 200 one puff daily.

## 2022-08-27 ENCOUNTER — Encounter (HOSPITAL_COMMUNITY): Payer: Self-pay

## 2022-08-27 ENCOUNTER — Ambulatory Visit (HOSPITAL_COMMUNITY): Payer: Medicare Other | Admitting: Psychiatry

## 2022-08-27 DIAGNOSIS — F4323 Adjustment disorder with mixed anxiety and depressed mood: Secondary | ICD-10-CM

## 2022-08-27 NOTE — Progress Notes (Signed)
IN- PERSON  THERAPIST PROGRESS NOTE  Session Time:  Thursday  08/27/2022 2:08 PM - 3:05 PM          Participation Level: Active  Behavioral Response: CasualAlert/sad/tearful  Type of Therapy: Individual Therapy  Treatment Goals addressed:   Have healthy grieving process around the loss of husband's previous functioning prior to strokes AEB pt identify and verbalizing feelings    Resume normal interest in activities AEB pt participating in pleasurable activities 3 x per week for the next 12 weeks   ProgressTowards Goals: Initial   Interventions: Supportive  Summary: Katie Davis is a 67 y.o. female who is referred for services by PCP Dr. Durwin Nora due to pt experiencing symptoms of anxiety. She denies any psychiatric hospitalization. She participated in outpatient therapy briefly about 20 years ago when she was first diagnosed with health issues related to autoimmune disease.  Patient states needing new tools to cope with life changes as her husband had 2 strokes about 3 years ago.  Her husband's functioning has changed significantly and has had a great impact on their relationship.  Patient reports grief and loss related to her husband's changed functioning.  She reports additional stress related to both she and her husband now are on fixed incomes as they are both disabled.  She also reports continued adjustment issues to move into the local area from [place] 7 years ago.  Patient last was seen about 3 to 4 weeks ago .  She continues to experience grief and loss issues along with adjustment issues regarding her husband's changed functioning due to having strokes.  She continues to experience sadness, worry, and decreased interest/pleasure in activities.  She reports having strong support from her family but reports they live in [place].  She expresses frustration as she and her husband are not able to travel to visit family.  She also expresses frustration as she has not been able to  attend  major family events.  She reports very limited support in the local area.  Patient reports she has continued attending the gym and has been scheduling time for self at night after her husband goes to bed.  She reports both these have been helpful.  Suicidal/Homicidal: Nowithout intent/plan  Therapist Response: Reviewed symptoms, discussed stressors, facilitated expression of thoughts and feelings, validated feelings, praised and reinforced patient's efforts to attend the gym and to schedule time for self, discussed effects on her mood/thoughts/behavior, developed plan with patient to maintain consistent efforts, developed treatment plan, obtained patient's permission to electronically signed plan as this was a virtual visit, also began to discuss possible resources in the area including support groups for caretakers  plan: Return again in 2 weeks.  Diagnosis: Adjustment disorder with mixed anxiety and depressed mood  Collaboration of Care: Primary care provider as patient works with PCP Dr. Durwin Nora  Patient/Guardian was advised Release of Information must be obtained prior to any record release in order to collaborate their care with an outside provider. Patient/Guardian was advised if they have not already done so to contact the registration department to sign all necessary forms in order for [place] to release information regarding their care.   Consent: Patient/Guardian gives verbal consent for treatment and assignment of benefits for services provided during this visit. Patient/Guardian expressed understanding and agreed to proceed.   Adah Salvage, LCSW 08/27/2022

## 2022-08-31 ENCOUNTER — Ambulatory Visit: Payer: Medicare Other | Admitting: Internal Medicine

## 2022-09-03 ENCOUNTER — Ambulatory Visit
Admission: EM | Admit: 2022-09-03 | Discharge: 2022-09-03 | Disposition: A | Discharge status: Home / Self Care | Payer: Medicare Other | Attending: Nurse Practitioner | Admitting: Nurse Practitioner

## 2022-09-03 DIAGNOSIS — T2023XA Burn of second degree of chin, initial encounter: Secondary | ICD-10-CM | POA: Diagnosis not present

## 2022-09-03 DIAGNOSIS — T2122XA Burn of second degree of abdominal wall, initial encounter: Secondary | ICD-10-CM

## 2022-09-03 MED ORDER — SILVER SULFADIAZINE 1 % EX CREA: 1.0000 | TOPICAL_CREAM | Freq: Every day | CUTANEOUS | 0 refills | Status: DC

## 2022-09-03 NOTE — Discharge Instructions (Signed)
Apply the Silvadene cream to the area in your chin and the areas on your abdomen that are red.  Once the blisters pop, you can apply Silvadene cream to this area as well.  Recommend keeping the areas covered until they heal.  Seek care with PCP if symptoms persist or worsen.  If you reconsider the tetanus shot you like to receive it, let us know.

## 2022-09-03 NOTE — ED Triage Notes (Signed)
Pt presents with burn/blisters to abdomin and 2 popped ones on forehead and chin from cooking last night, oil splashed on her while cooking.

## 2022-09-03 NOTE — ED Provider Notes (Signed)
[facility]    CSN: [id number] Arrival date & time: 09/03/22  1643      History   Chief Complaint Chief Complaint  Patient presents with   Burn    HPI Katie Davis is a 67 y.o. female.   Patient presents today for burn to lower abdomen and right chin that occurred yesterday.  Reports she was cooking fish and a pan when she accidentally dropped a fish on top of a pool of olive oil in the all of oil splattered on her shirt and face.  Reports the area on her chin initially blistered and popped.  She has 3 blisters on her abdomen that are very painful.  She denies fevers or nausea/vomiting.  No shortness of breath or chest pain.  Reports that she is very sensitive to changes in her body, unknown last Tdap and unsure if she wants to get it today.    Past Medical History:  Diagnosis Date   Abnormal glandular Papanicolaou smear of cervix    Allergy 2008   Latex ..after surgery   Anxiety    Asthma    Body mass index 30.0-30.9, adult    Celiac disease    Chronic vascular disorder of intestine (HCC)    Encounter for general adult medical examination with abnormal findings 05/07/2020   Fibromyalgia    GERD (gastroesophageal reflux disease) 14 years ago   Graves disease    Headache    Migrains   Hyperlipidemia    Hypothyroidism    IBS (irritable bowel syndrome) With constipation    ILD (interstitial lung disease) (HCC)    Lupus (systemic lupus erythematosus) (HCC)    Obesity, unspecified    Osteoporosis    Raynaud disease    Sjogren's disease (HCC)    Type 2 diabetes mellitus without complications (HCC) 02/25/2022    Patient Active Problem List   Diagnosis Date Noted   Caregiver stress 04/21/2022   Prediabetes 02/25/2022   Hyperlipidemia 02/25/2022   Hypothyroidism 02/25/2022   IBS (irritable bowel syndrome) 02/25/2022   History of Raynaud's syndrome 02/25/2022   Severe persistent asthma 02/25/2022   History of Sjogren's disease (HCC) 02/25/2022    History of systemic lupus erythematosus (SLE) (HCC) 02/25/2022   Osteoporosis 02/25/2022   Gastroesophageal reflux disease 01/30/2021   Early satiety 01/30/2021   Bloating 01/30/2021   Celiac disease    Superior mesenteric artery stenosis (HCC)    DOE (dyspnea on exertion) 12/05/2020   Alternating constipation and diarrhea 05/07/2020   Encounter for general adult medical examination with abnormal findings 05/07/2020   Nausea without vomiting 05/07/2020   Generalized abdominal pain 10/24/2019   Postmenopausal 12/14/2018   Vasomotor symptoms due to menopause 12/14/2018   History of abnormal cervical Pap smear 12/14/2018    Past Surgical History:  Procedure Laterality Date   APPENDECTOMY     CHOLECYSTECTOMY     COLONOSCOPY WITH PROPOFOL N/A 06/10/2020   Surgeon: Lanelle Bal, DO;  diverticulosis and nonbleeding internal hemorrhoids, due for repeat in 2032.   DG THUMB RIGHT HAND ([facility] HX)     EYE SURGERY  2019   Cataract. Right eye   little finger Right    placement of pin   OTHER SURGICAL HISTORY     biopsies on both feet   REDUCTION MAMMAPLASTY      OB History     Gravida  3   Para      Term      Preterm  AB  3   Living  0      SAB  3   IAB      Ectopic      Multiple      Live Births               Home Medications    Prior to Admission medications   Medication Sig Start Date End Date Taking? Authorizing Provider  acidophilus (RISAQUAD) CAPS capsule Take 1 capsule by mouth daily.   Yes [provider]  albuterol (VENTOLIN HFA) 108 (90 Base) MCG/ACT inhaler Inhale 1-2 puffs into the lungs every 6 (six) hours as needed for wheezing or shortness of breath.   Yes [provider]  aspirin EC 81 MG tablet Take 1 tablet (81 mg total) by mouth daily. Swallow whole. 04/04/21  Yes Branch, Dorothe Pea, MD  atenolol (TENORMIN) 50 MG tablet Take 1 tablet (50 mg total) by mouth 2 (two) times daily. Patient taking differently: Take 50 mg  by mouth 2 (two) times daily. Patient taking for migraines. 06/15/22  Yes Billie Lade, MD  CALCIUM CARBONATE ANTACID PO Take 2 tablets by mouth daily.   Yes [provider]  Cholecalciferol (VITAMIN D3 GUMMIES PO) Take 6,000 Units by mouth daily. 2000 units   Yes [provider]  clonazePAM (KLONOPIN) 0.5 MG tablet Take 0.5-1 tablets (0.25-0.5 mg total) by mouth 2 (two) times daily as needed for anxiety. 08/20/22  Yes Billie Lade, MD  DULoxetine (CYMBALTA) 20 MG capsule Take 1 capsule (20 mg total) by mouth daily. 07/30/22  Yes Rice, Jamesetta Orleans, MD  ELDERBERRY PO Take 200 mg by mouth daily. 100 mg   Yes [provider]  estradiol (ESTRACE VAGINAL) 0.1 MG/GM vaginal cream Pea-sized amount once per week 08/13/22  Yes Ozan, Jennifer, DO  fluticasone (FLONASE) 50 MCG/ACT nasal spray Place 1 spray into both nostrils daily as needed (Sinus).   Yes [provider]  fluticasone furoate-vilanterol (BREO ELLIPTA) 200-25 MCG/ACT AEPB Inhale 1 puff into the lungs daily. 06/16/22  Yes Coralyn Helling, MD  ibuprofen (ADVIL) 800 MG tablet Take 1 tablet (800 mg total) by mouth every 8 (eight) hours as needed for moderate pain. 05/28/22  Yes Billie Lade, MD  levothyroxine (SYNTHROID) 100 MCG tablet Take 1 tablet (100 mcg total) by mouth See admin instructions. Takes every other day. Alternates with 112 mcg "Brand Name" 07/06/22  Yes Billie Lade, MD  linaclotide [facility]) 145 MCG CAPS capsule Take 1 capsule (145 mcg total) by mouth daily before breakfast. 06/04/22 06/04/23 Yes Billie Lade, MD  Omega-3 Fatty Acids (FISH OIL) 1000 MG CAPS Take 2,000 mg by mouth daily.   Yes [provider]  ondansetron (ZOFRAN) 4 MG tablet TAKE 1 TABLET BY MOUTH EVERY 8 HOURS AS NEEDED FOR NAUSEA FOR VOMITING 08/19/22  Yes Billie Lade, MD  pantoprazole (PROTONIX) 40 MG tablet Take 1 tablet (40 mg total) by mouth daily before breakfast. 02/18/22 02/18/23 Yes Carver, Charles K, DO   Perfluorohexyloctane (MIEBO) 1.338 GM/ML SOLN Apply to eye.   Yes [provider]  Polyethyl Glyc-Propyl Glyc PF (SYSTANE HYDRATION PF) 0.4-0.3 % SOLN Place 1 drop into both eyes daily as needed (Dry eye).   Yes [provider]  rosuvastatin (CRESTOR) 5 MG tablet Take 5 mg by mouth every Monday, Wednesday, and Friday. 03/05/20  Yes [provider]  silver sulfADIAZINE (SILVADENE) 1 % cream Apply 1 Application topically daily. 09/03/22  Yes Cathlean Marseilles  A, NP  SYNTHROID 112 MCG tablet Take 1 tablet (112 mcg total) by mouth See admin instructions. Alternates every other day with 100 mcg "Brand Name" 04/05/22 10/02/22 Yes Billie Lade, MD    Family History Family History  Problem Relation Age of Onset   Congestive Heart Failure Mother    Arthritis Mother    Aneurysm Father    Celiac disease Brother    Melanoma Brother    Aneurysm Brother        stomach   Asthma Brother    Cancer Brother    Stroke Sister    Celiac disease Sister    Irritable bowel syndrome Sister    Cancer Sister    Celiac disease Brother    Aneurysm Sister    Other Sister        multiple back surgeries   Colon cancer Neg Hx     Social History Social History   Tobacco Use   Smoking status: Never    Passive exposure: Never   Smokeless tobacco: Never  Vaping Use   Vaping Use: Never used  Substance Use Topics   Alcohol use: Not Currently    Comment: wine occ   Drug use: Never     Allergies   Prednisone, Bactrim [sulfamethoxazole-trimethoprim], Codeine, Denosumab, Gluten meal, Influenza vaccines, Keflex [cephalexin], Levaquin [levofloxacin], Sulfa antibiotics, Contrast media [iodinated contrast media], Doxycycline, and Latex   Review of Systems Review of Systems Per HPI  Physical Exam Triage Vital Signs ED Triage Vitals [09/03/22 1655]  Enc Vitals Group     BP 126/77     Pulse Rate 70     Resp 16     Temp 98.2 F (36.8 C)     Temp Source Oral     SpO2 96 %      Weight      Height      Head Circumference      Peak Flow      Pain Score 6     Pain Loc      Pain Edu?      Excl. in GC?    No data found.  Updated Vital Signs BP 126/77 (BP Location: Right Arm)   Pulse 70   Temp 98.2 F (36.8 C) (Oral)   Resp 16   SpO2 96%   Visual Acuity Right Eye Distance:   Left Eye Distance:   Bilateral Distance:    Right Eye Near:   Left Eye Near:    Bilateral Near:     Physical Exam Vitals and nursing note reviewed.  Constitutional:      General: She is not in acute distress.    Appearance: Normal appearance. She is not toxic-appearing.  HENT:     Mouth/Throat:     Mouth: Mucous membranes are moist.     Pharynx: Oropharynx is clear.  Pulmonary:     Effort: Pulmonary effort is normal. No respiratory distress.  Skin:    Capillary Refill: Capillary refill takes less than 2 seconds.     Findings: Burn present.     Comments: Oval-shaped open wound to right chin without surrounding erythema, active drainage, or warmth.  There are multiple blistered burns to the lower abdomen that appear to be serous filled.  Multiple other superficial scattered burns to abdomen that are erythematous and not blistered and without active drainage.  Neurological:     Mental Status: She is alert and oriented to person, place, and time.  Psychiatric:  Behavior: Behavior is cooperative.      UC Treatments / Results  Labs (all labs ordered are listed, but only abnormal results are displayed) Labs Reviewed - No data to display  EKG   Radiology No results found.  Procedures Procedures (including critical care time)  Medications Ordered in UC Medications - No data to display  Initial Impression / Assessment and Plan / UC Course  I have reviewed the triage vital signs and the nursing notes.  Pertinent labs & imaging results that were available during my care of the patient were reviewed by me and considered in my medical decision making (see chart  for details).   Patient is well-appearing, normotensive, afebrile, not tachycardic, not tachypneic, oxygenating well on room air.    1. Partial thickness burn of abdomen, initial encounter 2. Partial thickness burn of chin, initial encounter Treat with Silvadene cream; patient has allergy to sulfa antibiotics with nausea/vomiting which is not an anaphylactic reaction therefore I feel the topical cream will be safe for patient Wound care discussed Encouraged not to pop the blisters and they will likely burst on their own Patient declined Tdap today Seek care for persistent/worsening symptoms despite treatment   The patient was given the opportunity to ask questions.  All questions answered to their satisfaction.  The patient is in agreement to this plan.    Final Clinical Impressions(s) / UC Diagnoses   Final diagnoses:  Partial thickness burn of abdomen, initial encounter  Partial thickness burn of chin, initial encounter     Discharge Instructions      Apply the Silvadene cream to the area in your chin and the areas on your abdomen that are red.  Once the blisters pop, you can apply Silvadene cream to this area as well.  Recommend keeping the areas covered until they heal.  Seek care with PCP if symptoms persist or worsen.  If you reconsider the tetanus shot you like to receive it, let us know.    ED Prescriptions     Medication Sig Dispense Auth. Provider   silver sulfADIAZINE (SILVADENE) 1 % cream Apply 1 Application topically daily. 50 g Valentino Nose, NP      PDMP not reviewed this encounter.   Valentino Nose, NP 09/04/22 1420

## 2022-09-09 ENCOUNTER — Encounter: Payer: Self-pay | Admitting: Obstetrics & Gynecology

## 2022-09-09 ENCOUNTER — Telehealth: Payer: Self-pay

## 2022-09-09 NOTE — Telephone Encounter (Signed)
Pt has been notified that she must come in to be re-evaluated

## 2022-09-09 NOTE — Telephone Encounter (Signed)
Pt states: she was seen on last Thursday for burn provider gave instructions. Pt states the burn now has redness ,oozing yellowish pus and swelling, soreness, tender to the touch. Pt states she has been air drying it.    She says she does not feel well, today she has headaches, chills, body aches.   Pt states she had a colposcopy done on the 30th. And is still experimenting symptoms she is unsure if how she is feeling now is a direct result from the burns,  or if symptoms are from the procedure she had done.   COVID test is negative.

## 2022-09-10 ENCOUNTER — Encounter: Payer: Self-pay | Admitting: Family Medicine

## 2022-09-10 ENCOUNTER — Ambulatory Visit (INDEPENDENT_AMBULATORY_CARE_PROVIDER_SITE_OTHER): Payer: Medicare Other | Admitting: Family Medicine

## 2022-09-10 ENCOUNTER — Ambulatory Visit (HOSPITAL_COMMUNITY): Payer: Medicare Other | Admitting: Psychiatry

## 2022-09-10 ENCOUNTER — Encounter (HOSPITAL_COMMUNITY): Payer: Self-pay

## 2022-09-10 VITALS — BP 117/68 | HR 77 | Temp 98.5°F | Ht 63.0 in | Wt 164.0 lb

## 2022-09-10 DIAGNOSIS — L089 Local infection of the skin and subcutaneous tissue, unspecified: Secondary | ICD-10-CM

## 2022-09-10 DIAGNOSIS — T2112XA Burn of first degree of abdominal wall, initial encounter: Secondary | ICD-10-CM | POA: Diagnosis not present

## 2022-09-10 DIAGNOSIS — T2102XA Burn of unspecified degree of abdominal wall, initial encounter: Secondary | ICD-10-CM | POA: Insufficient documentation

## 2022-09-10 MED ORDER — AZITHROMYCIN 250 MG PO TABS
ORAL_TABLET | ORAL | 0 refills | Status: DC
Start: 2022-09-10 — End: 2022-11-12

## 2022-09-10 MED ORDER — WHITE PETROLATUM EX GEL
Freq: Three times a day (TID) | CUTANEOUS | 0 refills | Status: DC | PRN
Start: 2022-09-10 — End: 2022-11-18
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
## 2022-09-10 NOTE — Patient Instructions (Addendum)
        Great to see you today.   - Please take medications as prescribed. - Follow up with your primary health provider if any health concerns arises. - If symptoms worsen please contact your primary care provider and/or visit the emergency department.  

## 2022-09-24 ENCOUNTER — Ambulatory Visit: Payer: Medicare Other | Admitting: Cardiology

## 2022-09-24 ENCOUNTER — Encounter: Payer: Self-pay | Admitting: Internal Medicine

## 2022-09-24 ENCOUNTER — Other Ambulatory Visit: Payer: Self-pay

## 2022-09-24 DIAGNOSIS — R198 Other specified symptoms and signs involving the digestive system and abdomen: Secondary | ICD-10-CM

## 2022-09-24 MED ORDER — LINACLOTIDE 145 MCG PO CAPS
145.0000 ug | ORAL_CAPSULE | Freq: Every day | ORAL | 3 refills | Status: DC
Start: 2022-09-24 — End: 2023-03-25

## 2022-09-28 ENCOUNTER — Encounter (HOSPITAL_COMMUNITY): Payer: Self-pay

## 2022-09-28 ENCOUNTER — Ambulatory Visit (HOSPITAL_COMMUNITY): Payer: Medicare Other | Admitting: Psychiatry

## 2022-10-04 ENCOUNTER — Other Ambulatory Visit: Payer: Self-pay | Admitting: Internal Medicine

## 2022-10-21 ENCOUNTER — Encounter (HOSPITAL_COMMUNITY): Payer: Self-pay

## 2022-10-21 ENCOUNTER — Ambulatory Visit (HOSPITAL_COMMUNITY): Payer: Medicare Other | Admitting: Psychiatry

## 2022-11-09 ENCOUNTER — Encounter: Payer: Self-pay | Admitting: Internal Medicine

## 2022-11-09 DIAGNOSIS — E039 Hypothyroidism, unspecified: Secondary | ICD-10-CM

## 2022-11-10 ENCOUNTER — Ambulatory Visit (HOSPITAL_COMMUNITY): Payer: Medicare Other | Admitting: Psychiatry

## 2022-11-10 DIAGNOSIS — F4323 Adjustment disorder with mixed anxiety and depressed mood: Secondary | ICD-10-CM | POA: Diagnosis not present
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
## 2022-11-11 ENCOUNTER — Encounter: Payer: Self-pay | Admitting: Obstetrics & Gynecology

## 2022-11-11 ENCOUNTER — Other Ambulatory Visit (HOSPITAL_COMMUNITY): Payer: Self-pay | Admitting: Internal Medicine

## 2022-11-11 ENCOUNTER — Encounter: Payer: Self-pay | Admitting: Internal Medicine

## 2022-11-11 ENCOUNTER — Encounter: Payer: Self-pay | Admitting: Pulmonary Disease

## 2022-11-11 DIAGNOSIS — L089 Local infection of the skin and subcutaneous tissue, unspecified: Secondary | ICD-10-CM

## 2022-11-11 DIAGNOSIS — Z1231 Encounter for screening mammogram for malignant neoplasm of breast: Secondary | ICD-10-CM

## 2022-11-12 MED ORDER — AZITHROMYCIN 250 MG PO TABS
ORAL_TABLET | ORAL | 0 refills | Status: DC
Start: 2022-11-12 — End: 2022-11-18

## 2022-11-12 NOTE — Telephone Encounter (Signed)
Please advise, thank you.

## 2022-11-12 NOTE — Telephone Encounter (Signed)
Tammy can you address this triage call, thank you

## 2022-11-12 NOTE — Telephone Encounter (Signed)
Sorry to hear you are not feeling well. Sounds like bronchitis.  May use Z-Pak No. 1 no refills.  Mucinex DM twice daily as needed for cough congestion.  Fluids and rest.  If not feeling better will need office visit. Rx sent   Please contact office for sooner follow up if symptoms do not improve or worsen or seek emergency care

## 2022-11-15 ENCOUNTER — Other Ambulatory Visit: Payer: Self-pay | Admitting: Internal Medicine

## 2022-11-16 ENCOUNTER — Other Ambulatory Visit: Payer: Self-pay | Admitting: Internal Medicine

## 2022-11-17 ENCOUNTER — Other Ambulatory Visit: Payer: Self-pay

## 2022-11-17 DIAGNOSIS — Z0001 Encounter for general adult medical examination with abnormal findings: Secondary | ICD-10-CM | POA: Diagnosis not present

## 2022-11-17 DIAGNOSIS — R300 Dysuria: Secondary | ICD-10-CM | POA: Diagnosis not present

## 2022-11-17 DIAGNOSIS — R1012 Left upper quadrant pain: Secondary | ICD-10-CM | POA: Diagnosis not present

## 2022-11-17 MED ORDER — ONDANSETRON HCL 4 MG PO TABS: 4.0000 mg | ORAL_TABLET | Freq: Three times a day (TID) | ORAL | 0 refills | Status: DC | PRN

## 2022-11-17 MED ORDER — CLONAZEPAM 0.5 MG PO TABS: 0.2500 mg | ORAL_TABLET | Freq: Two times a day (BID) | ORAL | 0 refills | Status: DC | PRN

## 2022-11-17 NOTE — Telephone Encounter (Signed)
Pt has been scheduled to see Baxter Hire on 11/18/2022

## 2022-11-17 NOTE — Progress Notes (Signed)
Referring Provider: Billie Lade, MD Primary Care Physician:  Billie Lade, MD Primary GI Physician: Dr. Marletta Lor  Chief Complaint  Patient presents with   Abdominal Pain    Lower abdominal pain. Change in bowel habits.     HPI:   Katie Davis is a 67 y.o. female  with history of  chronic abdominal pain, IBS-C, diverticulosis, celiac disease (biopsy confirmed), chronic nausea, 50% blockage of SMA, previously followed  by vascular prior to moving to [place]. 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  Saw Dr. Arbie Cookey 11/05/21 who recommended supportive care. Colonoscopy up-to-date March 2022 revealing diverticulosis and nonbleeding internal hemorrhoids, due for repeat in 2032. Recommended EGD for celiac surveillance in December 2023, but patient cancelled.   She is presenting today with chief complaint of left sided abdominal pain.   Today: Reports she had been doing well, but developed left-sided abdominal pain started about 1.5 weeks ago.  She was prescribed a course of azithromycin for bronchitis by her pulmonologist and states that this actually took the edge off her left sided abdominal pain.  She does still have some mild symptoms.  She is also been having some low abdominal cramping, bloating, and unproductive bowel movements.  She had blood work completed yesterday that showed upper normal TSH.  Patient states that this is actually quite high for her as she develops severe symptoms when her TSH gets above 1.7.  She is taking Linzess 145 mcg daily but has not been taking MiraLAX lately.  States that MiraLAX with Linzess does work very well for her.  She has been under a lot of stress lately.  She is the primary caregiver of her husband who recently underwent a back surgery and has previously had 2 strokes.  She has gained some weight lately due to not being quite as strict with her  dietary choices though she does continue to follow a strict gluten-free diet.  She has chronic nausea.  Had a little more nausea than normal last week, but symptoms have returned to baseline.  No vomiting.  GERD is well-controlled on pantoprazole 40 mg daily.  No NSAIDs recently.     Past Medical History:  Diagnosis Date   Abnormal glandular Papanicolaou smear of cervix    Allergy 2008   Latex ..after surgery   Anxiety    Asthma    Body mass index 30.0-30.9, adult    Celiac disease    Chronic vascular disorder of intestine (HCC)    Encounter for general adult medical examination with abnormal findings 05/07/2020   Fibromyalgia    GERD (gastroesophageal reflux disease) 14 years ago   Graves disease    Headache    Migrains   Hyperlipidemia    Hypothyroidism    IBS (irritable bowel syndrome) With constipation    ILD (interstitial lung disease) (HCC)    Lupus (systemic lupus erythematosus) (HCC)    Obesity, unspecified    Osteoporosis    Raynaud disease    Sjogren's disease (HCC)    Type 2 diabetes mellitus without complications (HCC) 02/25/2022    Past Surgical History:  Procedure Laterality Date   APPENDECTOMY     CHOLECYSTECTOMY     COLONOSCOPY WITH PROPOFOL N/A 06/10/2020   Surgeon: Lanelle Bal, DO;  diverticulosis and nonbleeding internal hemorrhoids, due for repeat in 2032.   DG THUMB RIGHT HAND ([facility] HX)  EYE SURGERY  2019   Cataract. Right eye   little finger Right    placement of pin   OTHER SURGICAL HISTORY     biopsies on both feet   REDUCTION MAMMAPLASTY      Current Outpatient Medications  Medication Sig Dispense Refill   albuterol (VENTOLIN HFA) 108 (90 Base) MCG/ACT inhaler Inhale 1-2 puffs into the lungs every 6 (six) hours as needed for wheezing or shortness of breath.     aspirin EC 81 MG tablet Take 1 tablet (81 mg total) by mouth daily. Swallow whole. 90 tablet 3   atenolol (TENORMIN) 50 MG tablet Take 1 tablet (50 mg total) by mouth 2  (two) times daily. 180 tablet 3   CALCIUM CARBONATE ANTACID PO Take 2 tablets by mouth daily.     Cholecalciferol (VITAMIN D3 GUMMIES PO) Take 6,000 Units by mouth daily. 2000 units     clonazePAM (KLONOPIN) 0.5 MG tablet Take 0.5-1 tablets (0.25-0.5 mg total) by mouth 2 (two) times daily as needed for anxiety. 60 tablet 0   DULoxetine (CYMBALTA) 20 MG capsule Take 1 capsule (20 mg total) by mouth daily. 30 capsule 2   ELDERBERRY PO Take 200 mg by mouth daily. 100 mg     estradiol (ESTRACE VAGINAL) 0.1 MG/GM vaginal cream Pea-sized amount once per week 42.5 g 3   fluticasone (FLONASE) 50 MCG/ACT nasal spray Place 1 spray into both nostrils daily as needed (Sinus).     fluticasone furoate-vilanterol (BREO ELLIPTA) 200-25 MCG/ACT AEPB Inhale 1 puff into the lungs daily. 30 each 1   ibuprofen (ADVIL) 800 MG tablet Take 1 tablet (800 mg total) by mouth every 8 (eight) hours as needed for moderate pain. 30 tablet 0   levothyroxine (SYNTHROID) 100 MCG tablet Take 1 tablet (100 mcg total) by mouth See admin instructions. Takes every other day. Alternates with 112 mcg "Brand Name" 90 tablet 0   linaclotide (LINZESS) 145 MCG CAPS capsule Take 1 capsule (145 mcg total) by mouth daily before breakfast. 90 capsule 3   Omega-3 Fatty Acids (FISH OIL) 1000 MG CAPS Take 2,000 mg by mouth daily.     ondansetron (ZOFRAN) 4 MG tablet Take 1 tablet (4 mg total) by mouth every 8 (eight) hours as needed. 30 tablet 0   pantoprazole (PROTONIX) 40 MG tablet Take 1 tablet (40 mg total) by mouth daily before breakfast. 90 tablet 3   Perfluorohexyloctane (MIEBO) 1.338 GM/ML SOLN Apply to eye.     Polyethyl Glyc-Propyl Glyc PF (SYSTANE HYDRATION PF) 0.4-0.3 % SOLN Place 1 drop into both eyes daily as needed (Dry eye).     rosuvastatin (CRESTOR) 5 MG tablet Take 5 mg by mouth every Monday, Wednesday, and Friday.     SYNTHROID 112 MCG tablet Take 1 tablet (112 mcg total) by mouth See admin instructions. Alternates every other  day with 100 mcg "Brand Name" 90 tablet 1   acidophilus (RISAQUAD) CAPS capsule Take 1 capsule by mouth daily.     azithromycin (ZITHROMAX) 250 MG tablet Take 2 tablets on day 1, then 1 tablet daily on days 2 through 5 6 tablet 0   white petrolatum ointment Apply topically 3 (three) times daily as needed. 70 g 0   No current facility-administered medications for this visit.    Allergies as of 11/18/2022 - Review Complete 11/18/2022  Allergen Reaction Noted   Prednisone Palpitations 12/17/2020   Bactrim [sulfamethoxazole-trimethoprim] Nausea And Vomiting 11/16/2018   Codeine Nausea And Vomiting 02/21/2021  Denosumab  02/21/2021   Gluten meal Other (See Comments) 11/16/2018   Influenza vaccines Swelling 02/19/2022   Keflex [cephalexin] Nausea And Vomiting 12/09/2018   Levaquin [levofloxacin] Nausea And Vomiting 11/16/2018   Sulfa antibiotics Nausea And Vomiting 11/16/2018   Contrast media [iodinated contrast media] Nausea And Vomiting and Rash 11/16/2018   Doxycycline Palpitations 05/07/2020   Latex Hives and Rash 11/16/2018    Family History  Problem Relation Age of Onset   Congestive Heart Failure Mother    Arthritis Mother    Aneurysm Father    Celiac disease Brother    Melanoma Brother    Aneurysm Brother        stomach   Asthma Brother    Cancer Brother    Stroke Sister    Celiac disease Sister    Irritable bowel syndrome Sister    Cancer Sister    Celiac disease Brother    Aneurysm Sister    Other Sister        multiple back surgeries   Colon cancer Neg Hx     Social History   Socioeconomic History   Marital status: Married    Spouse name: Not on file   Number of children: Not on file   Years of education: Not on file   Highest education level: Not on file  Occupational History   Not on file  Tobacco Use   Smoking status: Never    Passive exposure: Never   Smokeless tobacco: Never  Vaping Use   Vaping status: Never Used  Substance and Sexual  Activity   Alcohol use: Not Currently    Comment: wine occ   Drug use: Never   Sexual activity: Not Currently    Birth control/protection: Abstinence, Post-menopausal, None  Other Topics Concern   Not on file  Social History Narrative   Not on file   Social Determinants of Health   Financial Resource Strain: Low Risk  (07/30/2022)   Overall Financial Resource Strain (CARDIA)    Difficulty of Paying Living Expenses: Not hard at all  Food Insecurity: No Food Insecurity (07/30/2022)   Hunger Vital Sign    Worried About Radiation protection practitioner of Food in the Last Year: Never true    Ran Out of Food in the Last Year: Never true  Transportation Needs: No Transportation Needs (07/30/2022)   PRAPARE - Administrator, Civil Service (Medical): No    Lack of Transportation (Non-Medical): No  Physical Activity: Insufficiently Active (07/30/2022)   Exercise Vital Sign    Days of Exercise per Week: 3 days    Minutes of Exercise per Session: 40 min  Stress: Stress Concern Present (07/30/2022)   [employer] of Occupational Health - Occupational Stress Questionnaire    Feeling of Stress : To some extent  Social Connections: Moderately Isolated (07/30/2022)   Social Connection and Isolation Panel [NHANES]    Frequency of Communication with Friends and Family: More than three times a week    Frequency of Social Gatherings with Friends and Family: Once a week    Attends Religious Services: Never    Database administrator or Organizations: No    Attends Engineer, structural: Never    Marital Status: Married    Review of Systems: Gen: Denies fever, chills, cold or flulike symptoms, presyncope, syncope. GI: See HPI Heme: See HPI  Physical Exam: BP 116/76 (BP Location: Right Arm, Patient Position: Sitting, Cuff Size: Normal)   Pulse 70   Temp Marland Kitchen)  97.4 F (36.3 C) (Temporal)   Ht 5\' 3"  (1.6 m)   Wt 169 lb 3.2 oz (76.7 kg)   SpO2 97%   BMI 29.97 kg/m  General:   Alert and  oriented. No distress noted. Pleasant and cooperative.  Head:  Normocephalic and atraumatic. Eyes:  Conjuctiva clear without scleral icterus. Heart:  S1, S2 present without murmurs appreciated. Lungs:  Clear to auscultation bilaterally. No wheezes, rales, or rhonchi. No distress.  Abdomen:  +BS, soft, and non-distended.  Mild generalized tenderness to palpation, but greatest in left upper quadrant region.  No rebound or guarding. No HSM or masses noted. Msk:  Symmetrical without gross deformities. Normal posture. Extremities:  Without edema. Neurologic:  Alert and  oriented x4 Psych:  Normal mood and affect.    Assessment:  67 y.o. female  with history of  chronic abdominal pain, IBS-C, diverticulosis, celiac disease (biopsy confirmed), chronic nausea, 50% blockage of SMA, previously followed  by vascular prior to moving to [place]. 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  Saw Dr. Arbie Cookey 11/05/21 who recommended supportive care, presenting today with chief complaint of left sided abdominal pain, bloating, and constipation.   LUQ abdominal pain/lower abdominal pain/constipation: New onset abdominal pain in the lower abdomen and LUQ region about 1.5 weeks ago.  Notes some improvement after completing course of azithromycin for bronchitis, but still with mild symptoms.  She is also noticed worsening constipation lately, having unproductive bowel movements with Linzess 145 mcg daily.   She does not have any alarm symptoms. Denies any recent NSAID use.  On exam, she has mild generalized abdominal tenderness, greatest in the left upper quadrant.  Reviewed recent labs completed yesterday, CBC and CMP within normal limits.  TSH upper normal at 4.06, and patient feels this is contributing to her constipation stating anything above a TSH of 1.7, she tends to have symptoms of hypothyroidism.   I suspect her  abdominal pain is primarily driven by constipation/IBS.  Could have had mild diverticulitis as she reports improvement with antibiotics.  Less likely pancreatitis, but we will check a lipase.  Advised to continue Linzess 145 mcg daily and add MiraLAX 1-2 times daily as she reports MiraLAX as previously worked very well for her when added to [employer].  Requested that she call back on Monday with a progress report or let me know if she has any worsening abdominal pain before then.  If her abdominal pain is not improving, we will arrange a CT scan for further evaluation.   Plan:  Check lipase Continue Linzess 145 mcg daily. Start MiraLAX 17 g 1-2 times daily. Follow-up date to be determined pending progress report on Monday.  Will need to follow-up on celiac disease at next visit.    Ermalinda Memos, PA-C [facility] 11/18/2022

## 2022-11-18 ENCOUNTER — Encounter: Payer: Self-pay | Admitting: Internal Medicine

## 2022-11-18 ENCOUNTER — Other Ambulatory Visit: Payer: Self-pay

## 2022-11-18 ENCOUNTER — Ambulatory Visit: Payer: Medicare Other | Admitting: Gastroenterology

## 2022-11-18 ENCOUNTER — Encounter: Payer: Self-pay | Admitting: Gastroenterology

## 2022-11-18 VITALS — BP 116/76 | HR 70 | Temp 97.4°F | Ht 63.0 in | Wt 169.2 lb

## 2022-11-18 DIAGNOSIS — R1030 Lower abdominal pain, unspecified: Secondary | ICD-10-CM | POA: Diagnosis not present

## 2022-11-18 DIAGNOSIS — R1012 Left upper quadrant pain: Secondary | ICD-10-CM

## 2022-11-18 DIAGNOSIS — K581 Irritable bowel syndrome with constipation: Secondary | ICD-10-CM

## 2022-11-18 LAB — URINALYSIS
Bilirubin, UA: NEGATIVE
Glucose, UA: NEGATIVE
Ketones, UA: NEGATIVE
Leukocytes,UA: NEGATIVE
Nitrite, UA: NEGATIVE
Protein,UA: NEGATIVE
Specific Gravity, UA: 1.03 (ref 1.005–1.030)
Urobilinogen, Ur: 0.2 mg/dL (ref 0.2–1.0)
pH, UA: 5.5 (ref 5.0–7.5)

## 2022-11-18 LAB — CMP14+EGFR
ALT: 11 IU/L (ref 0–32)
AST: 16 IU/L (ref 0–40)
Albumin: 4.2 g/dL (ref 3.9–4.9)
Alkaline Phosphatase: 75 IU/L (ref 44–121)
BUN/Creatinine Ratio: 23 (ref 12–28)
BUN: 14 mg/dL (ref 8–27)
Bilirubin Total: 0.2 mg/dL (ref 0.0–1.2)
CO2: 22 mmol/L (ref 20–29)
Calcium: 9.5 mg/dL (ref 8.7–10.3)
Chloride: 106 mmol/L (ref 96–106)
Creatinine, Ser: 0.62 mg/dL (ref 0.57–1.00)
Globulin, Total: 2.6 g/dL (ref 1.5–4.5)
Glucose: 95 mg/dL (ref 70–99)
Potassium: 4 mmol/L (ref 3.5–5.2)
Sodium: 141 mmol/L (ref 134–144)
Total Protein: 6.8 g/dL (ref 6.0–8.5)
eGFR: 98 mL/min/{1.73_m2} (ref >59)

## 2022-11-18 LAB — CBC WITH DIFFERENTIAL/PLATELET
Basophils Absolute: 0 10*3/uL (ref 0.0–0.2)
Basos: 1 %
EOS (ABSOLUTE): 0.1 10*3/uL (ref 0.0–0.4)
Eos: 2 %
Hematocrit: 40.3 % (ref 34.0–46.6)
Hemoglobin: 13.7 g/dL (ref 11.1–15.9)
Immature Grans (Abs): 0 10*3/uL (ref 0.0–0.1)
Immature Granulocytes: 0 %
Lymphocytes Absolute: 1.3 10*3/uL (ref 0.7–3.1)
Lymphs: 28 %
MCH: 31.9 pg (ref 26.6–33.0)
MCHC: 34 g/dL (ref 31.5–35.7)
MCV: 94 fL (ref 79–97)
Monocytes Absolute: 0.2 10*3/uL (ref 0.1–0.9)
Monocytes: 5 %
Neutrophils Absolute: 3 10*3/uL (ref 1.4–7.0)
Neutrophils: 64 %
Platelets: 213 10*3/uL (ref 150–450)
RBC: 4.29 x10E6/uL (ref 3.77–5.28)
RDW: 13.7 % (ref 11.7–15.4)
WBC: 4.6 10*3/uL (ref 3.4–10.8)

## 2022-11-18 LAB — B12 AND FOLATE PANEL
Folate: 3.9 ng/mL (ref >3.0)
Vitamin B-12: 466 pg/mL (ref 232–1245)

## 2022-11-18 LAB — LIPID PANEL
Chol/HDL Ratio: 3.3 ratio (ref 0.0–4.4)
Cholesterol, Total: 138 mg/dL (ref 100–199)
HDL: 42 mg/dL (ref >39)
LDL Chol Calc (NIH): 66 mg/dL (ref 0–99)
Triglycerides: 177 mg/dL — ABNORMAL HIGH (ref 0–149)
VLDL Cholesterol Cal: 30 mg/dL (ref 5–40)

## 2022-11-18 LAB — TSH+FREE T4
Free T4: 1.37 ng/dL (ref 0.82–1.77)
TSH: 4.06 u[IU]/mL (ref 0.450–4.500)

## 2022-11-18 LAB — HEMOGLOBIN A1C
Est. average glucose Bld gHb Est-mCnc: 123 mg/dL
Hgb A1c MFr Bld: 5.9 % — ABNORMAL HIGH (ref 4.8–5.6)

## 2022-11-18 LAB — VITAMIN D 25 HYDROXY (VIT D DEFICIENCY, FRACTURES): Vit D, 25-Hydroxy: 63.1 ng/mL (ref 30.0–100.0)

## 2022-11-18 NOTE — Telephone Encounter (Signed)
Spoke with patient in office. Advised pt to reach out to provider who has been managing thyroid medications due to [facility] being out of the office.

## 2022-11-18 NOTE — Patient Instructions (Addendum)
Please have lipase checked at [facility].  We will check [facility] to see if this can be added onto the blood work you completed yesterday.  If not, you will need to go have another blood draw.  Continue Linzess 145 mcg daily.  Start MiraLAX 17 g 1-2 times daily mixed in 8 ounces of water or other noncarbonated beverage of your choice.  Please let me know if you have any worsening abdominal pain.  Otherwise, please call with a progress report on Monday to let me know how you are feeling.  If abdominal pain is not proving, we will plan to pursue CT scan.  It was good to see you today!  Ermalinda Memos, PA-C [facility]

## 2022-11-19 LAB — SPECIMEN STATUS REPORT

## 2022-11-19 LAB — LIPASE: Lipase: 25 U/L (ref 14–72)

## 2022-11-23 ENCOUNTER — Encounter: Payer: Self-pay | Admitting: *Deleted

## 2022-11-23 DIAGNOSIS — R1030 Lower abdominal pain, unspecified: Secondary | ICD-10-CM

## 2022-11-24 NOTE — Telephone Encounter (Signed)
Mindy/Tammy:  Please arrange CT abdomen and pelvis with oral contrast only ASAP.  Dx; Generalized abdominal pain, nausea, vomiting.

## 2022-11-24 NOTE — Telephone Encounter (Signed)
PA approved via [employer] Order ID: [id number]       Authorized Approval Valid Through: 11/24/2022 - 12/23/2022

## 2022-11-26 DIAGNOSIS — E0500 Thyrotoxicosis with diffuse goiter without thyrotoxic crisis or storm: Secondary | ICD-10-CM | POA: Diagnosis not present

## 2022-11-26 DIAGNOSIS — E890 Postprocedural hypothyroidism: Secondary | ICD-10-CM | POA: Diagnosis not present

## 2022-11-27 ENCOUNTER — Ambulatory Visit: Payer: Medicare Other

## 2022-11-27 ENCOUNTER — Other Ambulatory Visit: Payer: Self-pay

## 2022-11-27 DIAGNOSIS — E890 Postprocedural hypothyroidism: Secondary | ICD-10-CM

## 2022-11-30 ENCOUNTER — Ambulatory Visit (HOSPITAL_COMMUNITY)
Admission: RE | Admit: 2022-11-30 | Discharge: 2022-11-30 | Disposition: A | Discharge status: Home / Self Care | Payer: Medicare Other | Source: Ambulatory Visit | Attending: Gastroenterology | Admitting: Gastroenterology

## 2022-11-30 DIAGNOSIS — R1030 Lower abdominal pain, unspecified: Secondary | ICD-10-CM | POA: Insufficient documentation

## 2022-11-30 DIAGNOSIS — K5730 Diverticulosis of large intestine without perforation or abscess without bleeding: Secondary | ICD-10-CM | POA: Diagnosis not present

## 2022-11-30 DIAGNOSIS — R109 Unspecified abdominal pain: Secondary | ICD-10-CM | POA: Diagnosis not present

## 2022-12-01 ENCOUNTER — Encounter: Payer: Self-pay | Admitting: Orthopedic Surgery

## 2022-12-01 ENCOUNTER — Ambulatory Visit: Payer: Medicare Other | Admitting: Orthopedic Surgery

## 2022-12-01 ENCOUNTER — Other Ambulatory Visit (INDEPENDENT_AMBULATORY_CARE_PROVIDER_SITE_OTHER): Payer: Medicare Other

## 2022-12-01 VITALS — BP 128/77 | HR 71 | Ht 63.0 in | Wt 167.0 lb

## 2022-12-01 DIAGNOSIS — S76111A Strain of right quadriceps muscle, fascia and tendon, initial encounter: Secondary | ICD-10-CM | POA: Diagnosis not present

## 2022-12-01 DIAGNOSIS — M79604 Pain in right leg: Secondary | ICD-10-CM
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
## 2022-12-02 ENCOUNTER — Other Ambulatory Visit: Payer: Self-pay | Admitting: Internal Medicine

## 2022-12-02 DIAGNOSIS — L820 Inflamed seborrheic keratosis: Secondary | ICD-10-CM | POA: Diagnosis not present

## 2022-12-02 DIAGNOSIS — D225 Melanocytic nevi of trunk: Secondary | ICD-10-CM | POA: Diagnosis not present

## 2022-12-02 DIAGNOSIS — Z1283 Encounter for screening for malignant neoplasm of skin: Secondary | ICD-10-CM | POA: Diagnosis not present

## 2022-12-02 DIAGNOSIS — L821 Other seborrheic keratosis: Secondary | ICD-10-CM | POA: Diagnosis not present

## 2022-12-02 MED ORDER — ROSUVASTATIN CALCIUM 5 MG PO TABS: 5.0000 mg | ORAL_TABLET | ORAL | 0 refills | Status: DC

## 2022-12-03 ENCOUNTER — Encounter: Payer: Self-pay | Admitting: Internal Medicine

## 2022-12-07 ENCOUNTER — Encounter: Payer: Self-pay | Admitting: *Deleted

## 2022-12-08 ENCOUNTER — Other Ambulatory Visit: Payer: Self-pay | Admitting: Internal Medicine

## 2022-12-08 ENCOUNTER — Other Ambulatory Visit: Payer: Self-pay

## 2022-12-08 ENCOUNTER — Encounter: Payer: Self-pay | Admitting: Internal Medicine

## 2022-12-08 DIAGNOSIS — J984 Other disorders of lung: Secondary | ICD-10-CM | POA: Diagnosis not present

## 2022-12-08 DIAGNOSIS — J020 Streptococcal pharyngitis: Secondary | ICD-10-CM | POA: Diagnosis not present

## 2022-12-08 DIAGNOSIS — J069 Acute upper respiratory infection, unspecified: Secondary | ICD-10-CM | POA: Diagnosis not present

## 2022-12-08 DIAGNOSIS — J209 Acute bronchitis, unspecified: Secondary | ICD-10-CM | POA: Diagnosis not present

## 2022-12-08 MED ORDER — SYNTHROID 112 MCG PO TABS: 112.0000 ug | ORAL_TABLET | ORAL | 1 refills | Status: DC

## 2022-12-08 MED ORDER — IBUPROFEN 800 MG PO TABS: 800.0000 mg | ORAL_TABLET | Freq: Three times a day (TID) | ORAL | 0 refills | Status: DC | PRN

## 2022-12-08 MED ORDER — ATENOLOL 50 MG PO TABS: 50.0000 mg | ORAL_TABLET | Freq: Two times a day (BID) | ORAL | 3 refills | Status: DC

## 2022-12-08 MED ORDER — FLUTICASONE PROPIONATE 50 MCG/ACT NA SUSP: 1.0000 | Freq: Every day | NASAL | 0 refills | Status: DC | PRN

## 2022-12-09 ENCOUNTER — Encounter: Payer: Self-pay | Admitting: Cardiology

## 2022-12-14 ENCOUNTER — Ambulatory Visit: Payer: Medicare Other | Admitting: Pulmonary Disease

## 2022-12-14 ENCOUNTER — Encounter: Payer: Self-pay | Admitting: Cardiology

## 2022-12-14 ENCOUNTER — Other Ambulatory Visit: Payer: Self-pay

## 2022-12-14 ENCOUNTER — Encounter: Payer: Self-pay | Admitting: Internal Medicine

## 2022-12-14 ENCOUNTER — Encounter: Payer: Self-pay | Admitting: Pulmonary Disease

## 2022-12-14 VITALS — BP 120/73 | HR 74 | Ht 63.0 in | Wt 169.8 lb

## 2022-12-14 DIAGNOSIS — J4550 Severe persistent asthma, uncomplicated: Secondary | ICD-10-CM | POA: Diagnosis not present

## 2022-12-14 NOTE — Progress Notes (Signed)
[facility], [facility] Sleep [facility]  Chief Complaint  Patient presents with   Follow-up    Follow up     Constitutional:  BP 120/73   Pulse 74   Ht 5\' 3"  (1.6 m)   Wt 169 lb 12.8 oz (77 kg)   SpO2 96%   BMI 30.08 kg/m   Past Medical History:  Anxiety, Celiac disease, Fibromyalgia, Grave's disease, HLD, Migraine headaches, HTN, IBS, Lupus, Osteoporosis, Raynaud disease, Sjogren's disease, COVID 19 August/September/October 2022  Past Surgical History:  She  has a past surgical history that includes Reduction mammaplasty; Cholecystectomy; Appendectomy; OTHER SURGICAL HISTORY; Colonoscopy with propofol (N/A, 06/10/2020); Eye surgery (2019); DG THUMB RIGHT HAND ([facility] HX); and little finger (Right).  Brief Summary:  Katie Davis is a 67 y.o. female with asthma and history of COVID.      Subjective:   Her has husband has been dealing with several health issues since June.  She hasn't been able to keep up with her exercise.  She has noticed getting winded more easily.  Has stayed on increased dose of breo and this has helped.    Physical Exam:   Appearance - well kempt   ENMT - no sinus tenderness, no oral exudate, no LAN, Mallampati 3 airway, no stridor  Respiratory - equal breath sounds bilaterally, no wheezing or rales  CV - s1s2 regular rate and rhythm, no murmurs  Ext - no clubbing, no edema  Skin - no rashes  Psych - normal mood and affect     Pulmonary testing:  PFT 02/27/21 >> FEV1 1.95 (82%), FEV1% 83, TLC 4.75 (96%), DLCO 68%  Chest Imaging:  HRCT chest 03/05/21 >> enlarged PA, atherosclerosis, basilar and peripheral GGO with bronchiectasis, calcified granuloma LLL, air trapping  Cardiac testing:  Echo 03/07/21 >> EF 55 to 60%, mild LVH  Social History:  She  reports that she has never smoked. She has never been exposed to tobacco smoke. She has never used smokeless tobacco. She reports that she does not currently use alcohol. She  reports that she does not use drugs.  Family History:  Her family history includes Aneurysm in her brother, father, and sister; Arthritis in her mother; Asthma in her brother; Cancer in her brother and sister; Celiac disease in her brother, brother, and sister; Congestive Heart Failure in her mother; Irritable bowel syndrome in her sister; Melanoma in her brother; Other in her sister; Stroke in her sister.     Assessment/Plan:   Severe, persistent asthma. - she was not able to tolerate LAMAs and singulair - continue breo 200 one puff daily - prn albuterol - she has multiple medication sensitivities/allergies; she has been able to tolerate cefuroxime, zithromax and medrol when she has exacerbations more recently  Interstitial lung disease. - changes consistent with prior COVID 19 infections  Grave's disease. - she is followed by Dr. Sharl Ma with Endocrinology  Celiac disease. - followed by Dr. Marletta Lor with [facility]  Hx of Sjogren's disease, Lupus. - previously followed by rheumatology while living in [place] - she has rheumatology appointment with Dr. Orest Dikes in May, and will be seeing ophthalmology in [place]   Time Spent Involved in Patient Care on Day of Examination:  26 minutes  Follow up:   Patient Instructions  Follow up in 6 months  Medication List:   Allergies as of 12/14/2022       Reactions   Prednisone Palpitations   Bactrim [sulfamethoxazole-trimethoprim] Nausea And Vomiting   Migraines  Codeine Nausea And Vomiting   Denosumab    Other reaction(s): Unknown   Gluten Meal Other (See Comments)   Ceiliac disease   Influenza Vaccines Swelling   Keflex [cephalexin] Nausea And Vomiting   Levaquin [levofloxacin] Nausea And Vomiting   Sulfa Antibiotics Nausea And Vomiting   Contrast Media [iodinated Contrast Media] Nausea And Vomiting, Rash   Per patient, did fine with premedication with IV steroids before    Doxycycline  Palpitations   Made body feel crazy, increased heart rate   Latex Hives, Rash        Medication List        Accurate as of December 14, 2022  5:17 PM. If you have any questions, ask your nurse or doctor.          acidophilus Caps capsule Take 1 capsule by mouth daily.   albuterol 108 (90 Base) MCG/ACT inhaler Commonly known as: VENTOLIN HFA Inhale 1-2 puffs into the lungs every 6 (six) hours as needed for wheezing or shortness of breath.   aspirin EC 81 MG tablet Take 1 tablet (81 mg total) by mouth daily. Swallow whole.   atenolol 50 MG tablet Commonly known as: TENORMIN Take 1 tablet (50 mg total) by mouth 2 (two) times daily.   CALCIUM CARBONATE ANTACID PO Take 2 tablets by mouth daily.   clonazePAM 0.5 MG tablet Commonly known as: KLONOPIN Take 0.5-1 tablets (0.25-0.5 mg total) by mouth 2 (two) times daily as needed for anxiety.   DULoxetine 20 MG capsule Commonly known as: CYMBALTA Take 1 capsule (20 mg total) by mouth daily.   ELDERBERRY PO Take 200 mg by mouth daily. 100 mg   estradiol 0.1 MG/GM vaginal cream Commonly known as: ESTRACE VAGINAL Pea-sized amount once per week   Fish Oil 1000 MG Caps Take 2,000 mg by mouth daily.   fluticasone 50 MCG/ACT nasal spray Commonly known as: FLONASE Place 1 spray into both nostrils daily as needed (Sinus).   fluticasone furoate-vilanterol 200-25 MCG/ACT Aepb Commonly known as: Breo Ellipta Inhale 1 puff into the lungs daily.   ibuprofen 800 MG tablet Commonly known as: ADVIL Take 1 tablet (800 mg total) by mouth every 8 (eight) hours as needed for moderate pain.   levothyroxine 100 MCG tablet Commonly known as: Synthroid Take 1 tablet (100 mcg total) by mouth See admin instructions. Takes every other day. Alternates with 112 mcg "Brand Name"   Synthroid 112 MCG tablet Generic drug: levothyroxine Take 1 tablet (112 mcg total) by mouth See admin instructions. Alternates every other day with 100  mcg "Brand Name"   linaclotide 145 MCG Caps capsule Commonly known as: Linzess Take 1 capsule (145 mcg total) by mouth daily before breakfast.   Miebo 1.338 GM/ML Soln Generic drug: Perfluorohexyloctane Apply to eye.   ondansetron 4 MG tablet Commonly known as: ZOFRAN Take 1 tablet (4 mg total) by mouth every 8 (eight) hours as needed.   pantoprazole 40 MG tablet Commonly known as: PROTONIX Take 1 tablet (40 mg total) by mouth daily before breakfast.   rosuvastatin 5 MG tablet Commonly known as: CRESTOR Take 1 tablet (5 mg total) by mouth every Monday, Wednesday, and Friday.   Systane Hydration PF 0.4-0.3 % Soln Generic drug: Polyethyl Glyc-Propyl Glyc PF Place 1 drop into both eyes daily as needed (Dry eye).   VITAMIN D3 GUMMIES PO Take 6,000 Units by mouth daily. 2000 units        Signature:  Coralyn Helling, MD [facility] Pulmonary/Critical Care  Pager - [phone] 12/14/2022, 5:17 PM

## 2022-12-14 NOTE — Patient Instructions (Signed)
Follow up in 6 months 

## 2022-12-14 NOTE — Telephone Encounter (Signed)
Please see result note and relay to patient.

## 2022-12-14 NOTE — Telephone Encounter (Signed)
Would not attribute the headaches to being off blood thinner, definitely would think from bleed or perhaps new meds. Would defer to neurology.   Dominga Ferry MD

## 2022-12-15 MED ORDER — SYNTHROID 112 MCG PO TABS: 112.0000 ug | ORAL_TABLET | ORAL | 1 refills | Status: DC

## 2022-12-28 ENCOUNTER — Ambulatory Visit (HOSPITAL_COMMUNITY)
Admission: RE | Admit: 2022-12-28 | Discharge: 2022-12-28 | Disposition: A | Discharge status: Home / Self Care | Payer: Medicare Other | Source: Ambulatory Visit | Attending: Internal Medicine | Admitting: Internal Medicine

## 2022-12-28 ENCOUNTER — Other Ambulatory Visit: Payer: Self-pay | Admitting: *Deleted

## 2022-12-28 DIAGNOSIS — D485 Neoplasm of uncertain behavior of skin: Secondary | ICD-10-CM | POA: Diagnosis not present

## 2022-12-28 DIAGNOSIS — T148XXA Other injury of unspecified body region, initial encounter: Secondary | ICD-10-CM | POA: Diagnosis not present

## 2022-12-28 DIAGNOSIS — R2232 Localized swelling, mass and lump, left upper limb: Secondary | ICD-10-CM

## 2022-12-28 DIAGNOSIS — L820 Inflamed seborrheic keratosis: Secondary | ICD-10-CM | POA: Diagnosis not present

## 2022-12-28 DIAGNOSIS — Z1231 Encounter for screening mammogram for malignant neoplasm of breast: Secondary | ICD-10-CM | POA: Diagnosis not present

## 2022-12-29 ENCOUNTER — Ambulatory Visit: Payer: Medicare Other | Admitting: General Surgery

## 2022-12-29 ENCOUNTER — Encounter: Payer: Self-pay | Admitting: General Surgery

## 2022-12-29 VITALS — BP 117/81 | HR 66 | Temp 97.6°F | Resp 16 | Ht 63.0 in | Wt 169.0 lb

## 2022-12-29 DIAGNOSIS — L738 Other specified follicular disorders: Secondary | ICD-10-CM | POA: Diagnosis not present

## 2022-12-29 DIAGNOSIS — R2232 Localized swelling, mass and lump, left upper limb: Secondary | ICD-10-CM | POA: Diagnosis not present

## 2022-12-29 NOTE — Patient Instructions (Signed)
Plan for excision on 01/14/2023.

## 2022-12-29 NOTE — Progress Notes (Unsigned)
[facility] History and Physical  Reason for Referral:*** Referring Physician: ***  Chief Complaint   New Patient (Initial Visit)     Katie Davis is a 67 y.o. female.  HPI: ***.  The *** started *** and has had a duration of ***.  It is associated with ***.  The *** is improved with ***, and is made worse with ***.    Quality*** Context***  Past Medical History:  Diagnosis Date  . Abnormal glandular Papanicolaou smear of cervix   . Allergy 2008   Latex ..after surgery  . Anxiety   . Asthma   . Body mass index 30.0-30.9, adult   . Celiac disease   . Chronic vascular disorder of intestine (HCC)   . Encounter for general adult medical examination with abnormal findings 05/07/2020  . Fibromyalgia   . GERD (gastroesophageal reflux disease) 14 years ago  . Graves disease   . Headache    Migrains  . Hyperlipidemia   . Hypothyroidism   . IBS (irritable bowel syndrome) With constipation   . ILD (interstitial lung disease) (HCC)   . Lupus (systemic lupus erythematosus) (HCC)   . Obesity, unspecified   . Osteoporosis   . Raynaud disease   . Sjogren's disease (HCC)   . Type 2 diabetes mellitus without complications (HCC) 02/25/2022    Past Surgical History:  Procedure Laterality Date  . APPENDECTOMY    . CHOLECYSTECTOMY    . COLONOSCOPY WITH PROPOFOL N/A 06/10/2020   Surgeon: Lanelle Bal, DO;  diverticulosis and nonbleeding internal hemorrhoids, due for repeat in 2032.  . DG THUMB RIGHT HAND ([facility] HX)    . EYE SURGERY  2019   Cataract. Right eye  . little finger Right    placement of pin  . OTHER SURGICAL HISTORY     biopsies on both feet  . REDUCTION MAMMAPLASTY      Family History  Problem Relation Age of Onset  . Congestive Heart Failure Mother   . Arthritis Mother   . Aneurysm Father   . Celiac disease Brother   . Melanoma Brother   . Aneurysm Brother        stomach  . Asthma Brother   . Cancer Brother   . Stroke Sister   .  Celiac disease Sister   . Irritable bowel syndrome Sister   . Cancer Sister   . Celiac disease Brother   . Aneurysm Sister   . Other Sister        multiple back surgeries  . Colon cancer Neg Hx     Social History   Tobacco Use  . Smoking status: Never    Passive exposure: Never  . Smokeless tobacco: Never  Vaping Use  . Vaping status: Never Used  Substance Use Topics  . Alcohol use: Not Currently    Comment: wine occ  . Drug use: Never    Medications: {medication reviewed/display:3041432} Allergies as of 12/29/2022       Reactions   Prednisone Palpitations   Bactrim [sulfamethoxazole-trimethoprim] Nausea And Vomiting   Migraines    Codeine Nausea And Vomiting   Denosumab    Other reaction(s): Unknown   Gluten Meal Other (See Comments)   Ceiliac disease   Influenza Vaccines Swelling   Keflex [cephalexin] Nausea And Vomiting   Levaquin [levofloxacin] Nausea And Vomiting   Sulfa Antibiotics Nausea And Vomiting   Contrast Media [iodinated Contrast Media] Nausea And Vomiting, Rash   Per patient, did fine with premedication with IV  steroids before    Doxycycline Palpitations   Made body feel crazy, increased heart rate   Latex Hives, Rash        Medication List        Accurate as of December 29, 2022 11:40 AM. If you have any questions, ask your nurse or doctor.          acidophilus Caps capsule Take 1 capsule by mouth daily.   albuterol 108 (90 Base) MCG/ACT inhaler Commonly known as: VENTOLIN HFA Inhale 1-2 puffs into the lungs every 6 (six) hours as needed for wheezing or shortness of breath.   aspirin EC 81 MG tablet Take 1 tablet (81 mg total) by mouth daily. Swallow whole.   atenolol 50 MG tablet Commonly known as: TENORMIN Take 1 tablet (50 mg total) by mouth 2 (two) times daily.   CALCIUM CARBONATE ANTACID PO Take 2 tablets by mouth daily.   clonazePAM 0.5 MG tablet Commonly known as: KLONOPIN Take 0.5-1 tablets (0.25-0.5 mg total) by  mouth 2 (two) times daily as needed for anxiety.   DULoxetine 20 MG capsule Commonly known as: CYMBALTA Take 1 capsule (20 mg total) by mouth daily.   ELDERBERRY PO Take 200 mg by mouth daily. 100 mg   estradiol 0.1 MG/GM vaginal cream Commonly known as: ESTRACE VAGINAL Pea-sized amount once per week   Fish Oil 1000 MG Caps Take 2,000 mg by mouth daily.   fluticasone 50 MCG/ACT nasal spray Commonly known as: FLONASE Place 1 spray into both nostrils daily as needed (Sinus).   Breo Ellipta 100-25 MCG/ACT Aepb Generic drug: fluticasone furoate-vilanterol Inhale 1 puff into the lungs daily. Alternates when needed   fluticasone furoate-vilanterol 200-25 MCG/ACT Aepb Commonly known as: Breo Ellipta Inhale 1 puff into the lungs daily.   ibuprofen 800 MG tablet Commonly known as: ADVIL Take 1 tablet (800 mg total) by mouth every 8 (eight) hours as needed for moderate pain.   levothyroxine 100 MCG tablet Commonly known as: Synthroid Take 1 tablet (100 mcg total) by mouth See admin instructions. Takes every other day. Alternates with 112 mcg "Brand Name"   Synthroid 112 MCG tablet Generic drug: levothyroxine Take 1 tablet (112 mcg total) by mouth See admin instructions. Alternates every other day with 100 mcg - Brand Name   linaclotide 145 MCG Caps capsule Commonly known as: Linzess Take 1 capsule (145 mcg total) by mouth daily before breakfast.   Miebo 1.338 GM/ML Soln Generic drug: Perfluorohexyloctane Apply to eye.   ondansetron 4 MG tablet Commonly known as: ZOFRAN Take 1 tablet (4 mg total) by mouth every 8 (eight) hours as needed.   pantoprazole 40 MG tablet Commonly known as: PROTONIX Take 1 tablet (40 mg total) by mouth daily before breakfast.   rosuvastatin 5 MG tablet Commonly known as: CRESTOR Take 1 tablet (5 mg total) by mouth every Monday, Wednesday, and Friday.   Systane Hydration PF 0.4-0.3 % Soln Generic drug: Polyethyl Glyc-Propyl Glyc PF Place 1  drop into both eyes daily as needed (Dry eye).   VITAMIN D3 GUMMIES PO Take 6,000 Units by mouth daily. 2000 units         ROS:  {Review of Systems:30496}  Blood pressure 117/81, pulse 66, temperature 97.6 F (36.4 C), temperature source Oral, resp. rate 16, height 5\' 3"  (1.6 m), weight 169 lb (76.7 kg), SpO2 95%. Physical Exam  Results: No results found for this or any previous visit (from the past 48 hour(s)).  No results found.  Assessment & Plan:  Katie Davis is a 67 y.o. female with *** -*** -*** -Follow up ***  All questions were answered to the satisfaction of the patient and family***.  The risk and benefits of *** were discussed including but not limited to ***.  After careful consideration, Katie Davis has decided to ***.    Lucretia Roers 12/29/2022, 11:40 AM

## 2022-12-31 ENCOUNTER — Encounter: Payer: Self-pay | Admitting: Internal Medicine

## 2022-12-31 DIAGNOSIS — E039 Hypothyroidism, unspecified: Secondary | ICD-10-CM

## 2023-01-05 ENCOUNTER — Ambulatory Visit (HOSPITAL_COMMUNITY): Payer: Medicare Other | Admitting: Psychiatry

## 2023-01-05 DIAGNOSIS — F4323 Adjustment disorder with mixed anxiety and depressed mood: Secondary | ICD-10-CM

## 2023-01-05 NOTE — Progress Notes (Signed)
IN- PERSON  THERAPIST PROGRESS NOTE  Session Time:  Tuesday  01/05/2023 1:10 PM  - 2:00 PM          Participation Level: Active  Behavioral Response: CasualAlert/sad/tearful  Type of Therapy: Individual Therapy  Treatment Goals addressed:   Have healthy grieving process around the loss of husband's previous functioning prior to strokes AEB pt identify and verbalizing feelings    Resume normal interest in activities AEB pt participating in pleasurable activities 3 x per week for the next 12 weeks   ProgressTowards Goal:progressing     Interventions: Supportive  Summary: Poet Linke is a 67 y.o. female who is referred for services by PCP Dr. Durwin Nora due to pt experiencing symptoms of anxiety. She denies any psychiatric hospitalization. She participated in outpatient therapy briefly about 20 years ago when she was first diagnosed with health issues related to autoimmune disease.  Patient states needing new tools to cope with life changes as her husband had 2 strokes about 3 years ago.  Her husband's functioning has changed significantly and has had a great impact on their relationship.  Patient reports grief and loss related to her husband's changed functioning.  She reports additional stress related to both she and her husband now are on fixed incomes as they are both disabled.  She also reports continued adjustment issues to move into the local area from [place] 7 years ago.  Patient last was seen about 2 months ago .  She reports continued stress and anxiety regarding husband's increased health issues.  Per patient's report, he fell again about 4 weeks ago.  He suffered a broken cheek, concussion, and a small brain bleed.  Patient also reports stress regarding her own health as she recently had biopsies.  She is thankful results were benign.  She expresses sadness, disappointment, and frustration neighbors who were somewhat supportive at some point distanced themselves from patient and her  husband in the past 4 to 5 weeks.  Patient reports feeling abandoned.  She reports she and her husband both are very lonely as they have no social support or involvement here.  Patient maintains contact and has support from her family who all reside in [place].  Patient and her husband are contemplating moving back to [place].  Patient expresses concern about how her husband would be able to manage the winters in [place] as the weather would necessitate sometimes being confined to being at home for days or weeks at a time.  Patient has been trying to use helpful coping strategies but still feels overwhelmed at times.  She resumed attendance at the gym today.   Suicidal/Homicidal: Nowithout intent/plan  Therapist Response: Reviewed symptoms, discussed stressors, facilitated expression of thoughts and feelings, validated feelings, assisted patient identify pros/cons of moving to [place], pros/cons of staying here, praised and reinforced patient's continued use of her support system with patient's efforts to use helpful coping strategies, encouraged patient to continue attending the gym for exercise as well as socialization   plan: Return again in 2 weeks.  Diagnosis: Adjustment disorder with mixed anxiety and depressed mood  Collaboration of Care: Primary care provider as patient works with PCP Dr. Durwin Nora  Patient/Guardian was advised Release of Information must be obtained prior to any record release in order to collaborate their care with an outside provider. Patient/Guardian was advised if they have not already done so to contact the registration department to sign all necessary forms in order for [place] to release information  regarding their care.   Consent: Patient/Guardian gives verbal consent for treatment and assignment of benefits for services provided during this visit. Patient/Guardian expressed understanding and agreed to proceed.   Adah Salvage, LCSW 01/05/2023

## 2023-01-06 DIAGNOSIS — E039 Hypothyroidism, unspecified: Secondary | ICD-10-CM | POA: Diagnosis not present

## 2023-01-07 ENCOUNTER — Encounter: Payer: Self-pay | Admitting: Internal Medicine

## 2023-01-07 DIAGNOSIS — E039 Hypothyroidism, unspecified: Secondary | ICD-10-CM

## 2023-01-07 LAB — CBC WITH DIFFERENTIAL/PLATELET
Basophils Absolute: 0 10*3/uL (ref 0.0–0.2)
Basos: 1 %
EOS (ABSOLUTE): 0.1 10*3/uL (ref 0.0–0.4)
Eos: 2 %
Hematocrit: 38.6 % (ref 34.0–46.6)
Hemoglobin: 13.1 g/dL (ref 11.1–15.9)
Immature Grans (Abs): 0 10*3/uL (ref 0.0–0.1)
Immature Granulocytes: 0 %
Lymphocytes Absolute: 1.3 10*3/uL (ref 0.7–3.1)
Lymphs: 35 %
MCH: 32.1 pg (ref 26.6–33.0)
MCHC: 33.9 g/dL (ref 31.5–35.7)
MCV: 95 fL (ref 79–97)
Monocytes Absolute: 0.2 10*3/uL (ref 0.1–0.9)
Monocytes: 6 %
Neutrophils Absolute: 2.2 10*3/uL (ref 1.4–7.0)
Neutrophils: 56 %
Platelets: 192 10*3/uL (ref 150–450)
RBC: 4.08 x10E6/uL (ref 3.77–5.28)
RDW: 13.7 % (ref 11.7–15.4)
WBC: 3.9 10*3/uL (ref 3.4–10.8)

## 2023-01-07 LAB — CMP14+EGFR
ALT: 12 [IU]/L (ref 0–32)
AST: 17 [IU]/L (ref 0–40)
Albumin: 4.2 g/dL (ref 3.9–4.9)
Alkaline Phosphatase: 72 [IU]/L (ref 44–121)
BUN/Creatinine Ratio: 30 — ABNORMAL HIGH (ref 12–28)
BUN: 18 mg/dL (ref 8–27)
Bilirubin Total: 0.3 mg/dL (ref 0.0–1.2)
CO2: 22 mmol/L (ref 20–29)
Calcium: 9 mg/dL (ref 8.7–10.3)
Chloride: 103 mmol/L (ref 96–106)
Creatinine, Ser: 0.61 mg/dL (ref 0.57–1.00)
Globulin, Total: 2.7 g/dL (ref 1.5–4.5)
Glucose: 112 mg/dL — ABNORMAL HIGH (ref 70–99)
Potassium: 4 mmol/L (ref 3.5–5.2)
Sodium: 139 mmol/L (ref 134–144)
Total Protein: 6.9 g/dL (ref 6.0–8.5)
eGFR: 99 mL/min/{1.73_m2} (ref >59)

## 2023-01-07 LAB — THYROID PANEL
Free Thyroxine Index: 3.3 (ref 1.2–4.9)
T3 Uptake Ratio: 30 % (ref 24–39)
T4, Total: 11.1 ug/dL (ref 4.5–12.0)

## 2023-01-08 ENCOUNTER — Other Ambulatory Visit: Payer: Self-pay | Admitting: Internal Medicine

## 2023-01-08 MED ORDER — ONDANSETRON HCL 4 MG PO TABS: 4.0000 mg | ORAL_TABLET | Freq: Three times a day (TID) | ORAL | 0 refills | Status: DC | PRN

## 2023-01-08 MED ORDER — CLONAZEPAM 0.5 MG PO TABS: 0.2500 mg | ORAL_TABLET | Freq: Two times a day (BID) | ORAL | 0 refills | Status: DC | PRN

## 2023-01-09 LAB — TSH: TSH: 0.677 u[IU]/mL (ref 0.450–4.500)

## 2023-01-09 LAB — SPECIMEN STATUS REPORT

## 2023-01-13 ENCOUNTER — Other Ambulatory Visit: Payer: Self-pay | Admitting: Internal Medicine

## 2023-01-14 ENCOUNTER — Encounter: Payer: Self-pay | Admitting: Internal Medicine

## 2023-01-14 ENCOUNTER — Other Ambulatory Visit: Payer: Self-pay

## 2023-01-14 ENCOUNTER — Ambulatory Visit: Payer: Medicare Other | Admitting: General Surgery

## 2023-01-14 DIAGNOSIS — E039 Hypothyroidism, unspecified: Secondary | ICD-10-CM

## 2023-01-14 MED ORDER — LEVOTHYROXINE SODIUM 100 MCG PO TABS: 100.0000 ug | ORAL_TABLET | ORAL | 0 refills | Status: DC

## 2023-01-15 ENCOUNTER — Encounter: Payer: Self-pay | Admitting: General Surgery

## 2023-01-15 ENCOUNTER — Ambulatory Visit (INDEPENDENT_AMBULATORY_CARE_PROVIDER_SITE_OTHER): Payer: Medicare Other | Admitting: General Surgery

## 2023-01-15 ENCOUNTER — Other Ambulatory Visit: Payer: Self-pay | Admitting: Family Medicine

## 2023-01-15 VITALS — BP 116/75 | HR 69 | Temp 98.1°F | Resp 16 | Ht 63.0 in | Wt 168.0 lb

## 2023-01-15 DIAGNOSIS — R2232 Localized swelling, mass and lump, left upper limb: Secondary | ICD-10-CM | POA: Diagnosis not present

## 2023-01-15 DIAGNOSIS — D179 Benign lipomatous neoplasm, unspecified: Secondary | ICD-10-CM | POA: Diagnosis not present

## 2023-01-15 DIAGNOSIS — D1722 Benign lipomatous neoplasm of skin and subcutaneous tissue of left arm: Secondary | ICD-10-CM | POA: Diagnosis not present

## 2023-01-15 NOTE — Patient Instructions (Signed)
Keep the bandage on for 48 hours.  After 48 hours can remove and replace bandage with some neosporin for about 5-7 days after. Take tylenol or ibuprofen for pain control. Call with issues.

## 2023-01-15 NOTE — Progress Notes (Signed)
[facility] Procedure Note  01/15/23  Pre-procedure Diagnosis: Left forearm ulnar aspect 2cm mass    Post-procedure Diagnosis: Same   Procedure(s) Performed: Excision of lipoma left forearm, 2cm    Surgeon: Leatrice Jewels. Henreitta Leber, MD   Assistants: No qualified resident was available    Anesthesia: Lidocaine 1%    Specimens: Lipoma    Estimated Blood Loss: Minimal  Wound Class: Clean    Procedure Indications: Ms. Comrie is a 67 yo who has a mass that feels superficial on her left forearm.  She has felt like this was growing and wanted it removed. We discussed excision and risk of bleeding, infection, recurrence, finding something unexpected.   Findings: 2cm lipomatous lesion    Procedure: The patient was taken to the procedure room and placed in a chair with her forearm ulnar surface exposed. The mass was just distal to the elbow region.. The left forearm was prepared and draped in the usual sterile fashion. Lidocaine 1% was injected after topical anesthetic was placed on the skin.  A vertical incision was made in the area and carried down to the subcutaneous tissue where a lipomatous lesion was removed with a combination of blunt and sharp dissection.  This was removed in its entirety 2cm in size.  The cavity was made hemostatic. A 3-0 Vicryl was used to close the cavity. The skin was used to close the 3-0 Nylon. Neosporin and a bandaid were placed.   Final inspection revealed acceptable hemostasis. The patient tolerated the procedure well.     Algis Greenhouse, MD [facility] [address] [place], [place] [address] [phone] (office)

## 2023-01-18 ENCOUNTER — Encounter: Payer: Self-pay | Admitting: Internal Medicine

## 2023-01-18 ENCOUNTER — Other Ambulatory Visit: Payer: Self-pay

## 2023-01-18 MED ORDER — LEVOTHYROXINE SODIUM 100 MCG PO TABS: 100.0000 ug | ORAL_TABLET | ORAL | 0 refills | Status: DC

## 2023-01-18 MED ORDER — SYNTHROID 112 MCG PO TABS: 112.0000 ug | ORAL_TABLET | ORAL | 1 refills | Status: DC

## 2023-01-19 ENCOUNTER — Encounter: Payer: Self-pay | Admitting: Internal Medicine

## 2023-01-19 ENCOUNTER — Ambulatory Visit (INDEPENDENT_AMBULATORY_CARE_PROVIDER_SITE_OTHER): Payer: Medicare Other | Admitting: Internal Medicine

## 2023-01-19 VITALS — BP 113/73 | HR 67 | Ht 63.0 in | Wt 169.8 lb

## 2023-01-19 DIAGNOSIS — J019 Acute sinusitis, unspecified: Secondary | ICD-10-CM | POA: Diagnosis not present

## 2023-01-19 DIAGNOSIS — J0100 Acute maxillary sinusitis, unspecified: Secondary | ICD-10-CM | POA: Diagnosis not present

## 2023-01-19 DIAGNOSIS — E782 Mixed hyperlipidemia: Secondary | ICD-10-CM

## 2023-01-19 DIAGNOSIS — R7303 Prediabetes: Secondary | ICD-10-CM

## 2023-01-19 DIAGNOSIS — B9689 Other specified bacterial agents as the cause of diseases classified elsewhere: Secondary | ICD-10-CM | POA: Insufficient documentation

## 2023-01-19 DIAGNOSIS — E039 Hypothyroidism, unspecified: Secondary | ICD-10-CM

## 2023-01-19 MED ORDER — AZITHROMYCIN 250 MG PO TABS: ORAL_TABLET | ORAL | 0 refills | Status: DC

## 2023-01-19 NOTE — Telephone Encounter (Signed)
Patient is seeing dr Durwin Nora today

## 2023-01-19 NOTE — Progress Notes (Signed)
Established Patient Office Visit  Subjective   Patient ID: Katie Davis, female    DOB: 09-Sep-1955  Age: 67 y.o. MRN: [id number]  Chief Complaint  Patient presents with   Hypertension    Six week follow up    Sinusitis    Sinus infection    Katie Davis returns to care today for routine follow-up.  She was last evaluated by me on 5/6.  No medication changes were made at that time and 36-month follow-up was arranged.  In the interim, she has been evaluated by gastroenterology, dermatology, endocrinology, general surgery, gynecology, and sleep medicine.  Most recently, she underwent excision of a left forearm mass on 11/1.  There have otherwise been no acute interval events.  Today Katie Davis endorses sinus congestion with pain/pressure and and nasal secretions that are dark yellow/green.  She denies fever/chills and is unaware of any sick contacts.  Symptoms have not improved despite use of OTC medications.  She does not have any additional concerns to discuss.  Past Medical History:  Diagnosis Date   Abnormal glandular Papanicolaou smear of cervix    Allergy 2008   Latex ..after surgery   Anxiety    Asthma    Body mass index 30.0-30.9, adult    Celiac disease    Chronic vascular disorder of intestine (HCC)    Encounter for general adult medical examination with abnormal findings 05/07/2020   Fibromyalgia    GERD (gastroesophageal reflux disease) 14 years ago   Graves disease    Headache    Migrains   Hyperlipidemia    Hypothyroidism    IBS (irritable bowel syndrome) With constipation    ILD (interstitial lung disease) (HCC)    Lupus (systemic lupus erythematosus) (HCC)    Obesity, unspecified    Osteoporosis    Raynaud disease    Sjogren's disease (HCC)    Type 2 diabetes mellitus without complications (HCC) 02/25/2022   Past Surgical History:  Procedure Laterality Date   APPENDECTOMY     CHOLECYSTECTOMY     COLONOSCOPY WITH PROPOFOL N/A 06/10/2020   Surgeon: Lanelle Bal, DO;  diverticulosis and nonbleeding internal hemorrhoids, due for repeat in 2032.   DG THUMB RIGHT HAND ([facility] HX)     EYE SURGERY  2019   Cataract. Right eye   little finger Right    placement of pin   OTHER SURGICAL HISTORY     biopsies on both feet   REDUCTION MAMMAPLASTY     Social History   Tobacco Use   Smoking status: Never    Passive exposure: Never   Smokeless tobacco: Never  Vaping Use   Vaping status: Never Used  Substance Use Topics   Alcohol use: Not Currently    Comment: wine occ   Drug use: Never   Family History  Problem Relation Age of Onset   Congestive Heart Failure Mother    Arthritis Mother    Aneurysm Father    Celiac disease Brother    Melanoma Brother    Aneurysm Brother        stomach   Asthma Brother    Cancer Brother    Stroke Sister    Celiac disease Sister    Irritable bowel syndrome Sister    Cancer Sister    Celiac disease Brother    Aneurysm Sister    Other Sister        multiple back surgeries   Colon cancer Neg Hx    Allergies  Allergen Reactions  Prednisone Palpitations   Bactrim [Sulfamethoxazole-Trimethoprim] Nausea And Vomiting    Migraines    Codeine Nausea And Vomiting   Denosumab     Other reaction(s): Unknown   Gluten Meal Other (See Comments)    Ceiliac disease   Influenza Vaccines Swelling   Keflex [Cephalexin] Nausea And Vomiting   Levaquin [Levofloxacin] Nausea And Vomiting   Sulfa Antibiotics Nausea And Vomiting   Contrast Media [Iodinated Contrast Media] Nausea And Vomiting and Rash    Per patient, did fine with premedication with IV steroids before    Doxycycline Palpitations    Made body feel crazy, increased heart rate   Latex Hives and Rash   Review of Systems  Constitutional:  Negative for chills and fever.  HENT:  Positive for congestion and sinus pain.   All other systems reviewed and are negative.    Objective:     BP 113/73 (BP Location: Right Arm, Patient Position: Sitting,  Cuff Size: Normal)   Pulse 67   Ht 5\' 3"  (1.6 m)   Wt 169 lb 12.8 oz (77 kg)   SpO2 97%   BMI 30.08 kg/m  BP Readings from Last 3 Encounters:  01/19/23 113/73  01/15/23 116/75  12/29/22 117/81   Physical Exam Vitals reviewed.  Constitutional:      General: She is not in acute distress.    Appearance: Normal appearance. She is not toxic-appearing.  HENT:     Head: Normocephalic and atraumatic.     Right Ear: External ear normal.     Left Ear: External ear normal.     Nose: Congestion and rhinorrhea present.     Mouth/Throat:     Mouth: Mucous membranes are moist.     Pharynx: Oropharynx is clear. No oropharyngeal exudate or posterior oropharyngeal erythema.  Eyes:     General: No scleral icterus.    Extraocular Movements: Extraocular movements intact.     Conjunctiva/sclera: Conjunctivae normal.     Pupils: Pupils are equal, round, and reactive to light.  Cardiovascular:     Rate and Rhythm: Normal rate and regular rhythm.     Pulses: Normal pulses.     Heart sounds: Normal heart sounds. No murmur heard.    No friction rub. No gallop.  Pulmonary:     Effort: Pulmonary effort is normal.     Breath sounds: Normal breath sounds. No wheezing, rhonchi or rales.  Abdominal:     General: Abdomen is flat. Bowel sounds are normal. There is no distension.     Palpations: Abdomen is soft.     Tenderness: There is no abdominal tenderness.  Musculoskeletal:        General: No swelling. Normal range of motion.     Cervical back: Normal range of motion.     Right lower leg: No edema.     Left lower leg: No edema.  Lymphadenopathy:     Cervical: No cervical adenopathy.  Skin:    General: Skin is warm and dry.     Capillary Refill: Capillary refill takes less than 2 seconds.     Coloration: Skin is not jaundiced.  Neurological:     General: No focal deficit present.     Mental Status: She is alert and oriented to person, place, and time.  Psychiatric:        Mood and Affect:  Mood normal.        Behavior: Behavior normal.   Last CBC Lab Results  Component Value Date   WBC 3.9  01/06/2023   HGB 13.1 01/06/2023   HCT 38.6 01/06/2023   MCV 95 01/06/2023   MCH 32.1 01/06/2023   RDW 13.7 01/06/2023   PLT 192 01/06/2023   Last metabolic panel Lab Results  Component Value Date   GLUCOSE 112 (H) 01/06/2023   NA 139 01/06/2023   K 4.0 01/06/2023   CL 103 01/06/2023   CO2 22 01/06/2023   BUN 18 01/06/2023   CREATININE 0.61 01/06/2023   EGFR 99 01/06/2023   CALCIUM 9.0 01/06/2023   PROT 6.9 01/06/2023   ALBUMIN 4.2 01/06/2023   LABGLOB 2.7 01/06/2023   AGRATIO 1.4 03/18/2022   BILITOT 0.3 01/06/2023   ALKPHOS 72 01/06/2023   AST 17 01/06/2023   ALT 12 01/06/2023   Last lipids Lab Results  Component Value Date   CHOL 138 11/17/2022   HDL 42 11/17/2022   LDLCALC 66 11/17/2022   TRIG 177 (H) 11/17/2022   CHOLHDL 3.3 11/17/2022   Last hemoglobin A1c Lab Results  Component Value Date   HGBA1C 5.9 (H) 11/17/2022   Last thyroid functions Lab Results  Component Value Date   TSH 0.677 01/06/2023   T4TOTAL 11.1 01/06/2023   Last vitamin D Lab Results  Component Value Date   VD25OH 63.1 11/17/2022   Last vitamin B12 and Folate Lab Results  Component Value Date   VITAMINB12 466 11/17/2022   FOLATE 3.9 11/17/2022   The 10-year ASCVD risk score (Arnett DK, et al., 2019) is: 11.3%    Assessment & Plan:   Problem List Items Addressed This Visit       Acute bacterial sinusitis    She endorses a 2-3-day history of sinus pain/pressure with dark yellow/green nasal secretions.  Z-Pak prescribed today.      Hypothyroidism    Followed by endocrinology (Dr. Sharl Ma).  Secondary hypothyroidism in the setting of Graves' disease s/p RAI.  She remains on alternating doses of levothyroxine 112 and 100 mcg.  Recently evaluated by endocrinology for follow-up.      Prediabetes    A1c 5.9 on labs from September, increased from 5.7 previously.  Lifestyle  modifications aimed at weight loss and improving her blood sugar were reinforced today.      Hyperlipidemia    Lipid panel updated in September.  Total cholesterol 138 and LDL 66.  LDL has improved from 99 previously.  She is currently prescribed rosuvastatin daily but reports taking it 4 days/week.  She has made significant dietary changes and was congratulated on her progress today.  No additional medication changes are indicated.      Return in about 6 months (around 07/19/2023).   Billie Lade, MD

## 2023-01-19 NOTE — Patient Instructions (Signed)
It was a pleasure to see you today.  Thank you for giving [place] the opportunity to be involved in your care.  Below is a brief recap of your visit and next steps.  We will plan to see you again in 6 months.  Summary Zpak prescribed for sinus infection Follow up in 6 months

## 2023-01-19 NOTE — Assessment & Plan Note (Signed)
Lipid panel updated in September.  Total cholesterol 138 and LDL 66.  LDL has improved from 99 previously.  She is currently prescribed rosuvastatin daily but reports taking it 4 days/week.  She has made significant dietary changes and was congratulated on her progress today.  No additional medication changes are indicated.

## 2023-01-19 NOTE — Assessment & Plan Note (Signed)
She endorses a 2-3-day history of sinus pain/pressure with dark yellow/green nasal secretions.  Z-Pak prescribed today.

## 2023-01-19 NOTE — Assessment & Plan Note (Signed)
A1c 5.9 on labs from September, increased from 5.7 previously.  Lifestyle modifications aimed at weight loss and improving her blood sugar were reinforced today.

## 2023-01-19 NOTE — Assessment & Plan Note (Signed)
Followed by endocrinology (Dr. Sharl Ma).  Secondary hypothyroidism in the setting of Graves' disease s/p RAI.  She remains on alternating doses of levothyroxine 112 and 100 mcg.  Recently evaluated by endocrinology for follow-up.

## 2023-01-20 ENCOUNTER — Encounter (HOSPITAL_COMMUNITY): Payer: Self-pay

## 2023-01-20 ENCOUNTER — Ambulatory Visit (HOSPITAL_COMMUNITY): Payer: Medicare Other | Admitting: Psychiatry

## 2023-01-20 ENCOUNTER — Telehealth: Payer: Self-pay | Admitting: Internal Medicine

## 2023-01-20 LAB — PATHOLOGY REPORT

## 2023-01-20 LAB — TISSUE SPECIMEN

## 2023-01-20 NOTE — Progress Notes (Signed)
Benign pathology, angiolipoma (lipoma with blood vessels). Can you let her know.

## 2023-01-20 NOTE — Telephone Encounter (Signed)
Belenda Cruise called from [facility] regards to azithromycin (ZITHROMAX Z-PAK) 250 MG tablet [[id number]]  lost in the sink all got wet needs approval to refill again.  [facility]

## 2023-01-20 NOTE — Telephone Encounter (Signed)
Spoke to pharmacist

## 2023-01-21 ENCOUNTER — Other Ambulatory Visit: Payer: Self-pay | Admitting: Internal Medicine

## 2023-01-21 MED ORDER — FLUTICASONE PROPIONATE 50 MCG/ACT NA SUSP: 1.0000 | Freq: Every day | NASAL | 0 refills | Status: DC | PRN

## 2023-01-21 NOTE — Telephone Encounter (Signed)
Patient advised to contact pharmacy , prescriptions was filled

## 2023-01-21 NOTE — Telephone Encounter (Signed)
Patient calling back asking for a call back what to do [phone]

## 2023-01-25 ENCOUNTER — Telehealth: Payer: Self-pay

## 2023-01-25 ENCOUNTER — Other Ambulatory Visit: Payer: Self-pay | Admitting: Adult Health

## 2023-01-25 DIAGNOSIS — J0100 Acute maxillary sinusitis, unspecified: Secondary | ICD-10-CM

## 2023-01-25 NOTE — Telephone Encounter (Signed)
This has already been taken care of

## 2023-01-25 NOTE — Telephone Encounter (Signed)
Copied from CRM [id number]. Topic: Clinical - Medication Question >> Jan 20, 2023 12:07 PM Whitney O wrote: Reason for CRM: patient knocked over pills over sink and they are gone and she is trying to see how to go about getting the prescription again. Talk to pharmacy and they couldn't help . Needing to speak with dr concerning medication or she doesn't have to speak with someone . She is asking could yall refill the medication cause they were damaged in the  sink

## 2023-01-27 ENCOUNTER — Ambulatory Visit (INDEPENDENT_AMBULATORY_CARE_PROVIDER_SITE_OTHER): Payer: Medicare Other | Admitting: General Surgery

## 2023-01-27 ENCOUNTER — Encounter: Payer: Self-pay | Admitting: General Surgery

## 2023-01-27 VITALS — BP 120/76 | HR 71 | Temp 97.9°F | Resp 12 | Ht 63.0 in | Wt 169.0 lb

## 2023-01-27 DIAGNOSIS — R2232 Localized swelling, mass and lump, left upper limb: Secondary | ICD-10-CM

## 2023-01-27 NOTE — Progress Notes (Signed)
[facility]  Doing well. No issues. Says that her right foot had a biopsy with Dr. Margo Aye that healed but that it was hard and painful, asked me to look at it.   BP 120/76   Pulse 71   Temp 97.9 F (36.6 C) (Oral)   Resp 12   Ht 5\' 3"  (1.6 m)   Wt 169 lb (76.7 kg)   SpO2 95%   BMI 29.94 kg/m  Left forearm healed, no erythema or drainage Sutures removed, steri strips placed Right lateral foot with hard callus like area   Pathology: Mature adipose tissue consistent with lipoma  with increased vascularity, consistent with a  benign angiolipoma.   Patient s/p lipoma excision. Doing well.  Steristrips will peel off in the next 5-7 days.  You can remove them once they are peeling off.  It is ok to shower. Pat the area dry.  Activity as tolerated See Dr. Frann Rider in [place] about spot on the foot   PRN follow up   Algis Greenhouse, MD [facility] [address] [place], [place] [address] [phone] (office)

## 2023-01-27 NOTE — Patient Instructions (Addendum)
Steristrips will peel off in the next 5-7 days.  You can remove them once they are peeling off.  It is ok to shower. Pat the area dry.  Activity as tolerated See Dr. Frann Rider in [place] about spot on the foot

## 2023-02-01 ENCOUNTER — Ambulatory Visit: Payer: Medicare Other

## 2023-02-01 ENCOUNTER — Ambulatory Visit
Admission: EM | Admit: 2023-02-01 | Discharge: 2023-02-01 | Disposition: A | Discharge status: Home / Self Care | Payer: Medicare Other | Attending: Nurse Practitioner | Admitting: Nurse Practitioner

## 2023-02-01 ENCOUNTER — Other Ambulatory Visit: Payer: Self-pay

## 2023-02-01 ENCOUNTER — Encounter: Payer: Self-pay | Admitting: Internal Medicine

## 2023-02-01 DIAGNOSIS — J22 Unspecified acute lower respiratory infection: Secondary | ICD-10-CM

## 2023-02-01 DIAGNOSIS — J189 Pneumonia, unspecified organism: Secondary | ICD-10-CM

## 2023-02-01 DIAGNOSIS — R059 Cough, unspecified: Secondary | ICD-10-CM | POA: Diagnosis not present

## 2023-02-01 MED ORDER — FLUTICASONE FUROATE-VILANTEROL 200-25 MCG/ACT IN AEPB: 1.0000 | INHALATION_SPRAY | Freq: Every day | RESPIRATORY_TRACT | 1 refills | Status: DC

## 2023-02-01 MED ORDER — IPRATROPIUM BROMIDE 0.02 % IN SOLN
0.5000 mg | Freq: Once | RESPIRATORY_TRACT | Status: AC
  Administered 2023-02-01: 0.5 mg via RESPIRATORY_TRACT

## 2023-02-01 MED ORDER — AMOXICILLIN-POT CLAVULANATE 400-57 MG/5ML PO SUSR: 875.0000 mg | Freq: Two times a day (BID) | ORAL | 0 refills | Status: AC

## 2023-02-01 MED ORDER — AZITHROMYCIN 250 MG PO TABS: ORAL_TABLET | ORAL | 0 refills | Status: DC

## 2023-02-01 NOTE — ED Triage Notes (Signed)
Pt c/o cough,Difficulty breathing x 1 week, intermittent fever x 1 week. Pt reports having sinus infection x 2 weeks ago, took Z-pack felt better then sx's returned days later. Pt has tried home inhaler with no relief.

## 2023-02-01 NOTE — ED Provider Notes (Addendum)
[facility]    CSN: [id number] Arrival date & time: 02/01/23  0849      History   Chief Complaint No chief complaint on file.   HPI Katie Davis is a 67 y.o. female.   Patient presents today with 5 day history of low grade fever, body aches, chills, and congested cough.  Also endorses sensation of can't take a full breath in, chest congestion worse in the morning, runny and stuffy nose, sore throat from coughing, headache, decreased appetite, and fatigue.  No wheezing, chest pain, abdominal pain, nausea/vomiting, or new diarrhea. Has been taking OTC cough syrup and Tylenol for the fever with minimal improvement.  Has history of asthma, takes Breo daily, and was treated 3 weeks ago for a sinus infection with azithromycin.    Patient reports palpitations with doxycycline and prednisone and she is unable to tolerate these medication.    Past Medical History:  Diagnosis Date   Abnormal glandular Papanicolaou smear of cervix    Allergy 2008   Latex ..after surgery   Anxiety    Asthma    Body mass index 30.0-30.9, adult    Celiac disease    Chronic vascular disorder of intestine (HCC)    Encounter for general adult medical examination with abnormal findings 05/07/2020   Fibromyalgia    GERD (gastroesophageal reflux disease) 14 years ago   Graves disease    Headache    Migrains   Hyperlipidemia    Hypothyroidism    IBS (irritable bowel syndrome) With constipation    ILD (interstitial lung disease) (HCC)    Lupus (systemic lupus erythematosus) (HCC)    Obesity, unspecified    Osteoporosis    Raynaud disease    Sjogren's disease (HCC)    Type 2 diabetes mellitus without complications (HCC) 02/25/2022    Patient Active Problem List   Diagnosis Date Noted   Acute bacterial sinusitis 01/19/2023   Burn of abdomen 09/10/2022   Caregiver stress 04/21/2022   Prediabetes 02/25/2022   Hyperlipidemia 02/25/2022   Hypothyroidism 02/25/2022   IBS (irritable bowel  syndrome) 02/25/2022   History of Raynaud's syndrome 02/25/2022   Severe persistent asthma 02/25/2022   History of Sjogren's disease (HCC) 02/25/2022   History of systemic lupus erythematosus (SLE) (HCC) 02/25/2022   Osteoporosis 02/25/2022   Gastroesophageal reflux disease 01/30/2021   Early satiety 01/30/2021   Bloating 01/30/2021   Celiac disease    Superior mesenteric artery stenosis (HCC)    DOE (dyspnea on exertion) 12/05/2020   Sjogren's syndrome (HCC) 11/03/2020   Alternating constipation and diarrhea 05/07/2020   Encounter for general adult medical examination with abnormal findings 05/07/2020   Nausea without vomiting 05/07/2020   Generalized abdominal pain 10/24/2019   Postmenopausal 12/14/2018   Vasomotor symptoms due to menopause 12/14/2018   History of abnormal cervical Pap smear 12/14/2018    Past Surgical History:  Procedure Laterality Date   APPENDECTOMY     CHOLECYSTECTOMY     COLONOSCOPY WITH PROPOFOL N/A 06/10/2020   Surgeon: Lanelle Bal, DO;  diverticulosis and nonbleeding internal hemorrhoids, due for repeat in 2032.   DG THUMB RIGHT HAND ([facility] HX)     EYE SURGERY  2019   Cataract. Right eye   little finger Right    placement of pin   OTHER SURGICAL HISTORY     biopsies on both feet   REDUCTION MAMMAPLASTY      OB History     Gravida  3   Para  Term      Preterm      AB  3   Living  0      SAB  3   IAB      Ectopic      Multiple      Live Births               Home Medications    Prior to Admission medications   Medication Sig Start Date End Date Taking? Authorizing Provider  amoxicillin-clavulanate (AUGMENTIN) 400-57 MG/5ML suspension Take 10.9 mLs (875 mg total) by mouth 2 (two) times daily for 7 days. 02/01/23 02/08/23 Yes Valentino Nose, NP  azithromycin (ZITHROMAX) 250 MG tablet Take (2) tablets by mouth on day 1, then take (1) tablet by mouth on days 2-5. 02/01/23  Yes Cathlean Marseilles A, NP   acidophilus (RISAQUAD) CAPS capsule Take 1 capsule by mouth daily.    [provider]  albuterol (VENTOLIN HFA) 108 (90 Base) MCG/ACT inhaler Inhale 1-2 puffs into the lungs every 6 (six) hours as needed for wheezing or shortness of breath.    [provider]  aspirin EC 81 MG tablet Take 1 tablet (81 mg total) by mouth daily. Swallow whole. 04/04/21   Antoine Poche, MD  atenolol (TENORMIN) 50 MG tablet Take 1 tablet (50 mg total) by mouth 2 (two) times daily. 12/08/22   Billie Lade, MD  CALCIUM CARBONATE ANTACID PO Take 2 tablets by mouth daily.    [provider]  Cholecalciferol (VITAMIN D3 GUMMIES PO) Take 6,000 Units by mouth daily. 2000 units    [provider]  clonazePAM (KLONOPIN) 0.5 MG tablet Take 0.5-1 tablets (0.25-0.5 mg total) by mouth 2 (two) times daily as needed for anxiety. 01/08/23   Billie Lade, MD  ELDERBERRY PO Take 200 mg by mouth daily. 100 mg    [provider]  estradiol (ESTRACE VAGINAL) 0.1 MG/GM vaginal cream Pea-sized amount once per week 08/13/22   Myna Hidalgo, DO  fluticasone (FLONASE) 50 MCG/ACT nasal spray Place 1 spray into both nostrils daily as needed (Sinus). 01/21/23   Billie Lade, MD  fluticasone furoate-vilanterol (BREO ELLIPTA) 100-25 MCG/ACT AEPB Inhale 1 puff into the lungs daily. Alternates when needed 12/18/20   [provider]  fluticasone furoate-vilanterol (BREO ELLIPTA) 200-25 MCG/ACT AEPB Inhale 1 puff into the lungs daily. 02/01/23   Billie Lade, MD  ibuprofen (ADVIL) 800 MG tablet Take 1 tablet (800 mg total) by mouth every 8 (eight) hours as needed for moderate pain. 12/08/22   Billie Lade, MD  levothyroxine (SYNTHROID) 100 MCG tablet Take 1 tablet (100 mcg total) by mouth See admin instructions. Takes every other day. Alternates with 112 mcg "Brand Name" 01/18/23   Billie Lade, MD  linaclotide [facility]) 145 MCG CAPS capsule Take 1 capsule (145 mcg total) by  mouth daily before breakfast. 09/24/22 09/24/23  Billie Lade, MD  Omega-3 Fatty Acids (FISH OIL) 1000 MG CAPS Take 2,000 mg by mouth daily.    [provider]  ondansetron (ZOFRAN) 4 MG tablet Take 1 tablet (4 mg total) by mouth every 8 (eight) hours as needed. 01/08/23   Billie Lade, MD  pantoprazole (PROTONIX) 40 MG tablet Take 1 tablet (40 mg total) by mouth daily before breakfast. 02/18/22 02/18/23  Lanelle Bal, DO  Perfluorohexyloctane (MIEBO) 1.338 GM/ML SOLN Apply to eye.    [provider]  Polyethyl Glyc-Propyl Glyc PF (SYSTANE HYDRATION PF) 0.4-0.3 %  SOLN Place 1 drop into both eyes daily as needed (Dry eye).    [provider]  rosuvastatin (CRESTOR) 5 MG tablet Take 1 tablet (5 mg total) by mouth every Monday, Wednesday, and Friday. 12/02/22   Billie Lade, MD  SYNTHROID 112 MCG tablet Take 1 tablet (112 mcg total) by mouth See admin instructions. Alternates every other day with 100 mcg - Brand Name 01/18/23 07/17/23  Billie Lade, MD    Family History Family History  Problem Relation Age of Onset   Congestive Heart Failure Mother    Arthritis Mother    Aneurysm Father    Celiac disease Brother    Melanoma Brother    Aneurysm Brother        stomach   Asthma Brother    Cancer Brother    Stroke Sister    Celiac disease Sister    Irritable bowel syndrome Sister    Cancer Sister    Celiac disease Brother    Aneurysm Sister    Other Sister        multiple back surgeries   Colon cancer Neg Hx     Social History Social History   Tobacco Use   Smoking status: Never    Passive exposure: Never   Smokeless tobacco: Never  Vaping Use   Vaping status: Never Used  Substance Use Topics   Alcohol use: Not Currently    Comment: wine occ   Drug use: Never     Allergies   Prednisone, Bactrim [sulfamethoxazole-trimethoprim], Codeine, Denosumab, Gluten meal, Influenza vaccines, Keflex [cephalexin], Levaquin [levofloxacin], Sulfa  antibiotics, Contrast media [iodinated contrast media], Doxycycline, and Latex   Review of Systems Review of Systems Per HPI  Physical Exam Triage Vital Signs ED Triage Vitals  Encounter Vitals Group     BP 02/01/23 0908 121/64     Systolic BP Percentile --      Diastolic BP Percentile --      Pulse Rate 02/01/23 0908 76     Resp 02/01/23 0908 15     Temp 02/01/23 0908 98.9 F (37.2 C)     Temp Source 02/01/23 0908 Oral     SpO2 02/01/23 0908 94 %     Weight --      Height --      Head Circumference --      Peak Flow --      Pain Score 02/01/23 0912 0     Pain Loc --      Pain Education --      Exclude from Growth Chart --    No data found.  Updated Vital Signs BP 121/64 (BP Location: Right Arm)   Pulse 76   Temp 98.9 F (37.2 C) (Oral)   Resp 15   SpO2 94%   SpO2: 98% after Atrovent  Visual Acuity Right Eye Distance:   Left Eye Distance:   Bilateral Distance:    Right Eye Near:   Left Eye Near:    Bilateral Near:     Physical Exam Vitals and nursing note reviewed.  Constitutional:      General: She is not in acute distress.    Appearance: Normal appearance. She is not toxic-appearing.  HENT:     Head: Normocephalic and atraumatic.     Right Ear: Tympanic membrane, ear canal and external ear normal.     Left Ear: Tympanic membrane, ear canal and external ear normal.     Nose: Nose normal. No congestion or rhinorrhea.  Mouth/Throat:     Mouth: Mucous membranes are moist.     Pharynx: Oropharynx is clear. Posterior oropharyngeal erythema present.  Eyes:     General: No scleral icterus.       Right eye: No discharge.        Left eye: No discharge.     Extraocular Movements: Extraocular movements intact.  Cardiovascular:     Rate and Rhythm: Normal rate and regular rhythm.  Pulmonary:     Effort: Pulmonary effort is normal. No respiratory distress.     Breath sounds: No stridor. Examination of the right-middle field reveals rhonchi. Examination  of the right-lower field reveals rhonchi. Rhonchi present. No wheezing or rales.  Musculoskeletal:     Cervical back: Normal range of motion.  Lymphadenopathy:     Cervical: No cervical adenopathy.  Skin:    General: Skin is warm and dry.     Capillary Refill: Capillary refill takes less than 2 seconds.     Coloration: Skin is not jaundiced or pale.     Findings: No erythema.  Neurological:     Mental Status: She is alert and oriented to person, place, and time.  Psychiatric:        Behavior: Behavior is cooperative.      UC Treatments / Results  Labs (all labs ordered are listed, but only abnormal results are displayed) Labs Reviewed - No data to display  EKG   Radiology DG Chest 2 View  Result Date: 02/01/2023 CLINICAL DATA:  Cough for 5 days. EXAM: CHEST - 2 VIEW COMPARISON:  December 05, 2020. FINDINGS: Stable cardiomediastinal silhouette. Both lungs are clear. The visualized skeletal structures are unremarkable. IMPRESSION: No active cardiopulmonary disease. Electronically Signed   By: Lupita Raider M.D.   On: 02/01/2023 11:32    Procedures Procedures (including critical care time)  Medications Ordered in UC Medications  ipratropium (ATROVENT) nebulizer solution 0.5 mg (0.5 mg Nebulization Given 02/01/23 0940)    Initial Impression / Assessment and Plan / UC Course  I have reviewed the triage vital signs and the nursing notes.  Pertinent labs & imaging results that were available during my care of the patient were reviewed by me and considered in my medical decision making (see chart for details).   Patient is well-appearing, normotensive, afebrile, not tachycardic, not tachypneic, oxygenating well on room air.  After ipratropium breathing treatment, SpO2 increased from 94% on room air to 98% on room air and patient reported subjective improvement in breathing.   1. Acute lower respiratory infection Chest xray by my review is concerning for pneumonia of the  right lower lobe; formal read pending and time of discharge and will contact patient with results later today Will treat with Augmentin and azithromycin as patient has history of asthma Supportive care discussed including continuing inhaler, OTC cough suppressant medication Return and strict ER precautions discussed with patient   The patient was given the opportunity to ask questions.  All questions answered to their satisfaction.  The patient is in agreement to this plan.    Final Clinical Impressions(s) / UC Diagnoses   Final diagnoses:  Acute lower respiratory infection     Discharge Instructions      I believe you have pneumonia in your right lower lung.  I will contact you later today with the results of the xray via mychart.  In the meantime, recommend starting Augmentin and Azithromycin as prescribed.  Recommend continuing cough suppressant medication and inhalers.  Follow up within  48 hours if symptoms are not significantly improved.  If symptoms worsen, seek care emergently.     ED Prescriptions     Medication Sig Dispense Auth. Provider   amoxicillin-clavulanate (AUGMENTIN) 400-57 MG/5ML suspension Take 10.9 mLs (875 mg total) by mouth 2 (two) times daily for 7 days. 152.6 mL Cathlean Marseilles A, NP   azithromycin (ZITHROMAX) 250 MG tablet Take (2) tablets by mouth on day 1, then take (1) tablet by mouth on days 2-5. 6 tablet Valentino Nose, NP      PDMP not reviewed this encounter.   Valentino Nose, NP 02/01/23 1113    Valentino Nose, NP 02/01/23 1226

## 2023-02-01 NOTE — Discharge Instructions (Addendum)
I believe you have pneumonia in your right lower lung.  I will contact you later today with the results of the xray via mychart.  In the meantime, recommend starting Augmentin and Azithromycin as prescribed.  Recommend continuing cough suppressant medication and inhalers.  Follow up within 48 hours if symptoms are not significantly improved.  If symptoms worsen, seek care emergently.

## 2023-02-03 ENCOUNTER — Ambulatory Visit (HOSPITAL_COMMUNITY): Payer: Medicare Other | Admitting: Psychiatry

## 2023-02-03 ENCOUNTER — Encounter (HOSPITAL_COMMUNITY): Payer: Self-pay

## 2023-02-16 ENCOUNTER — Encounter: Payer: Self-pay | Admitting: Internal Medicine

## 2023-02-17 ENCOUNTER — Other Ambulatory Visit: Payer: Self-pay | Admitting: Internal Medicine

## 2023-02-17 ENCOUNTER — Other Ambulatory Visit: Payer: Self-pay

## 2023-02-17 DIAGNOSIS — S99929A Unspecified injury of unspecified foot, initial encounter: Secondary | ICD-10-CM

## 2023-02-18 ENCOUNTER — Other Ambulatory Visit: Payer: Self-pay | Admitting: Internal Medicine

## 2023-02-18 MED ORDER — ONDANSETRON HCL 4 MG PO TABS: 4.0000 mg | ORAL_TABLET | Freq: Three times a day (TID) | ORAL | 3 refills | Status: DC | PRN

## 2023-02-18 MED ORDER — AZITHROMYCIN 250 MG PO TABS: ORAL_TABLET | ORAL | 0 refills | Status: DC

## 2023-02-23 ENCOUNTER — Encounter: Payer: Self-pay | Admitting: Internal Medicine

## 2023-02-23 DIAGNOSIS — E039 Hypothyroidism, unspecified: Secondary | ICD-10-CM

## 2023-03-02 ENCOUNTER — Encounter: Payer: Self-pay | Admitting: Internal Medicine

## 2023-03-02 ENCOUNTER — Other Ambulatory Visit: Payer: Self-pay | Admitting: Internal Medicine

## 2023-03-03 ENCOUNTER — Other Ambulatory Visit: Payer: Self-pay

## 2023-03-03 DIAGNOSIS — L820 Inflamed seborrheic keratosis: Secondary | ICD-10-CM | POA: Diagnosis not present

## 2023-03-03 DIAGNOSIS — L738 Other specified follicular disorders: Secondary | ICD-10-CM | POA: Diagnosis not present

## 2023-03-03 DIAGNOSIS — L905 Scar conditions and fibrosis of skin: Secondary | ICD-10-CM | POA: Diagnosis not present

## 2023-03-03 MED ORDER — ATENOLOL 50 MG PO TABS: 50.0000 mg | ORAL_TABLET | Freq: Two times a day (BID) | ORAL | 3 refills | Status: DC

## 2023-03-03 MED ORDER — CLONAZEPAM 0.5 MG PO TABS: 0.2500 mg | ORAL_TABLET | Freq: Two times a day (BID) | ORAL | 0 refills | Status: DC | PRN

## 2023-03-12 DIAGNOSIS — E039 Hypothyroidism, unspecified: Secondary | ICD-10-CM | POA: Diagnosis not present

## 2023-03-13 LAB — TSH+T4F+T3FREE
Free T4: 1.17 ng/dL (ref 0.82–1.77)
T3, Free: 2.6 pg/mL (ref 2.0–4.4)
TSH: 2.42 u[IU]/mL (ref 0.450–4.500)

## 2023-03-16 ENCOUNTER — Encounter: Payer: Self-pay | Admitting: Internal Medicine

## 2023-03-19 ENCOUNTER — Other Ambulatory Visit: Payer: Self-pay | Admitting: Internal Medicine

## 2023-03-19 DIAGNOSIS — R1084 Generalized abdominal pain: Secondary | ICD-10-CM

## 2023-03-19 DIAGNOSIS — K219 Gastro-esophageal reflux disease without esophagitis: Secondary | ICD-10-CM

## 2023-03-19 DIAGNOSIS — R110 Nausea: Secondary | ICD-10-CM

## 2023-03-24 ENCOUNTER — Encounter (HOSPITAL_COMMUNITY): Payer: Self-pay

## 2023-03-24 ENCOUNTER — Ambulatory Visit (HOSPITAL_COMMUNITY): Payer: Medicare Other | Admitting: Psychiatry

## 2023-03-25 ENCOUNTER — Encounter: Payer: Self-pay | Admitting: Internal Medicine

## 2023-03-25 ENCOUNTER — Other Ambulatory Visit: Payer: Self-pay

## 2023-03-25 ENCOUNTER — Other Ambulatory Visit: Payer: Self-pay | Admitting: Internal Medicine

## 2023-03-25 DIAGNOSIS — R198 Other specified symptoms and signs involving the digestive system and abdomen: Secondary | ICD-10-CM

## 2023-03-25 MED ORDER — LINACLOTIDE 145 MCG PO CAPS: 145.0000 ug | ORAL_CAPSULE | Freq: Every day | ORAL | 3 refills | Status: DC

## 2023-03-25 MED ORDER — FLUTICASONE PROPIONATE 50 MCG/ACT NA SUSP: 1.0000 | Freq: Every day | NASAL | 0 refills | Status: DC | PRN

## 2023-04-02 ENCOUNTER — Encounter: Payer: Self-pay | Admitting: Internal Medicine

## 2023-04-02 ENCOUNTER — Other Ambulatory Visit: Payer: Self-pay

## 2023-04-02 MED ORDER — FLUTICASONE FUROATE-VILANTEROL 200-25 MCG/ACT IN AEPB: 1.0000 | INHALATION_SPRAY | Freq: Every day | RESPIRATORY_TRACT | 1 refills | Status: DC

## 2023-04-06 ENCOUNTER — Encounter: Payer: Self-pay | Admitting: Internal Medicine

## 2023-04-06 ENCOUNTER — Ambulatory Visit (INDEPENDENT_AMBULATORY_CARE_PROVIDER_SITE_OTHER): Payer: Medicare Other | Admitting: Internal Medicine

## 2023-04-06 DIAGNOSIS — J019 Acute sinusitis, unspecified: Secondary | ICD-10-CM

## 2023-04-06 DIAGNOSIS — B9689 Other specified bacterial agents as the cause of diseases classified elsewhere: Secondary | ICD-10-CM | POA: Diagnosis not present

## 2023-04-06 MED ORDER — AZITHROMYCIN 250 MG PO TABS: ORAL_TABLET | ORAL | 0 refills | Status: DC

## 2023-04-06 NOTE — Patient Instructions (Signed)
It was a pleasure to see you today.  Thank you for giving [place] the opportunity to be involved in your care.  Below is a brief recap of your visit and next steps.  We will plan to see you again in May.  Summary Zpak prescribed today for treatment of sinus infection Continue current supportive care measures Follow up as scheduled in May

## 2023-04-06 NOTE — Progress Notes (Signed)
 Acute Office Visit  Subjective:     Patient ID: Katie Davis, female    DOB: 1955/08/01, 68 y.o.   MRN: [id number]  Chief Complaint  Patient presents with   Sinusitis    Sinus infection    Katie Davis has been evaluated today for an acute visit endorsing a nearly 2-week history of maxillary sinus pain/pressure and cough intermittently productive of yellow sputum.  Both the patient and her husband recently had a viral upper respiratory tract infection.  Symptoms temporarily improved, but have returned.  She endorses subjective fever but has not checked her temperature.  She has tried using over-the-counter cough and cold medications without significant symptom relief.  Denies shortness of breath, nausea/vomiting, and diarrhea.  Review of Systems  Constitutional:  Positive for chills and malaise/fatigue. Negative for fever.  HENT:  Positive for congestion and sinus pain. Negative for sore throat.   Respiratory:  Positive for cough and sputum production. Negative for shortness of breath.       Objective:     Physical Exam Vitals reviewed.  Constitutional:      General: She is not in acute distress.    Appearance: Normal appearance. She is not toxic-appearing.  HENT:     Head: Normocephalic and atraumatic.     Right Ear: External ear normal.     Left Ear: External ear normal.     Nose: Congestion and rhinorrhea present.     Mouth/Throat:     Mouth: Mucous membranes are moist.     Pharynx: Oropharynx is clear. No oropharyngeal exudate or posterior oropharyngeal erythema.  Eyes:     General: No scleral icterus.    Extraocular Movements: Extraocular movements intact.     Conjunctiva/sclera: Conjunctivae normal.     Pupils: Pupils are equal, round, and reactive to light.  Cardiovascular:     Rate and Rhythm: Normal rate and regular rhythm.     Pulses: Normal pulses.     Heart sounds: Normal heart sounds. No murmur heard.    No friction rub. No gallop.  Pulmonary:     Effort:  Pulmonary effort is normal.     Breath sounds: Normal breath sounds. No wheezing, rhonchi or rales.  Abdominal:     General: Abdomen is flat. Bowel sounds are normal. There is no distension.     Palpations: Abdomen is soft.     Tenderness: There is no abdominal tenderness.  Musculoskeletal:        General: No swelling. Normal range of motion.     Cervical back: Normal range of motion.     Right lower leg: No edema.     Left lower leg: No edema.  Lymphadenopathy:     Cervical: No cervical adenopathy.  Skin:    General: Skin is warm and dry.     Capillary Refill: Capillary refill takes less than 2 seconds.     Coloration: Skin is not jaundiced.  Neurological:     General: No focal deficit present.     Mental Status: She is alert and oriented to person, place, and time.  Psychiatric:        Mood and Affect: Mood normal.        Behavior: Behavior normal.       Assessment & Plan:   Problem List Items Addressed This Visit       Acute bacterial rhinosinusitis - Primary   Presenting today for an acute visit endorsing symptoms described above.  She recently had a viral URI.  Symptoms temporarily improved but recently returned.  She currently endorses maxillary sinus congestion with pain/pressure and a cough productive of dark yellow sputum.  She endorses subjective fever but has not checked her temperature.  Concern for superimposed bacterial infection in the setting of recent viral URI. -Treatment options reviewed.  Z-Pak prescribed for empiric antibiotic coverage.  Recommend as needed use of over-the-counter cough and cold medications.  We also discussed with routine use of nasal saline rinse followed by fluticasone nasal spray.  She will return to care if symptoms worsen or fail to improve.       Meds ordered this encounter  Medications   Azithromycin (ZITHROMAX) 250 MG tablet    Sig: Take (2) tablets by mouth on day 1, then take (1) tablet by mouth on days 2-5.    Dispense:  6  tablet    Refill:  0    Return if symptoms worsen or fail to improve.  Billie Lade, MD

## 2023-04-07 ENCOUNTER — Ambulatory Visit (HOSPITAL_COMMUNITY): Payer: Medicare Other | Admitting: Psychiatry

## 2023-04-08 ENCOUNTER — Encounter: Payer: Self-pay | Admitting: Internal Medicine

## 2023-04-08 ENCOUNTER — Other Ambulatory Visit: Payer: Self-pay

## 2023-04-08 MED ORDER — FLUTICASONE FUROATE-VILANTEROL 100-25 MCG/ACT IN AEPB: 1.0000 | INHALATION_SPRAY | Freq: Every day | RESPIRATORY_TRACT | 0 refills | Status: DC

## 2023-04-11 ENCOUNTER — Encounter: Payer: Self-pay | Admitting: Internal Medicine

## 2023-04-12 MED ORDER — ONDANSETRON HCL 4 MG PO TABS: 4.0000 mg | ORAL_TABLET | Freq: Three times a day (TID) | ORAL | 3 refills | Status: DC | PRN

## 2023-04-15 ENCOUNTER — Encounter: Payer: Self-pay | Admitting: Family Medicine

## 2023-04-15 ENCOUNTER — Ambulatory Visit (HOSPITAL_COMMUNITY)
Admission: RE | Admit: 2023-04-15 | Discharge: 2023-04-15 | Disposition: A | Discharge status: Home / Self Care | Payer: Medicare Other | Source: Ambulatory Visit | Attending: Family Medicine | Admitting: Family Medicine

## 2023-04-15 ENCOUNTER — Ambulatory Visit: Payer: Medicare Other | Admitting: Family Medicine

## 2023-04-15 VITALS — BP 120/78 | HR 66 | Ht 63.0 in | Wt 170.1 lb

## 2023-04-15 DIAGNOSIS — J069 Acute upper respiratory infection, unspecified: Secondary | ICD-10-CM | POA: Diagnosis not present

## 2023-04-15 DIAGNOSIS — R0602 Shortness of breath: Secondary | ICD-10-CM | POA: Diagnosis not present

## 2023-04-15 DIAGNOSIS — Z8616 Personal history of COVID-19: Secondary | ICD-10-CM | POA: Diagnosis not present

## 2023-04-15 MED ORDER — BENZONATATE 200 MG PO CAPS: 200.0000 mg | ORAL_CAPSULE | Freq: Two times a day (BID) | ORAL | 0 refills | Status: DC | PRN

## 2023-04-15 MED ORDER — CLARITHROMYCIN 500 MG PO TABS: 500.0000 mg | ORAL_TABLET | Freq: Two times a day (BID) | ORAL | 0 refills | Status: AC

## 2023-04-15 MED ORDER — IPRATROPIUM BROMIDE 0.03 % NA SOLN: 2.0000 | Freq: Two times a day (BID) | NASAL | 12 refills | Status: DC

## 2023-04-15 NOTE — Assessment & Plan Note (Signed)
Clarithromycin 500 mg twice daily x 7 days, ipratropium (ATROVENT) 0.03 % nasal spray 2 times daily Advise patient to rest to support your body's recovery. Stay hydrated by drinking water, tea, or broth. Using a humidifier can help soothe throat irritation and ease nasal congestion. For fever or pain, acetaminophen (Tylenol) is recommended. To relieve other symptoms, try saline nasal sprays, throat lozenges, or gargling with saltwater. Focus on eating light, healthy meals like fruits and vegetables to keep your strength up. Practice good hygiene by washing your hands frequently and covering your mouth when coughing or sneezing.Follow-up for worsening or persistent symptoms. Patient verbalizes understanding regarding plan of care and all questions answered

## 2023-04-15 NOTE — Patient Instructions (Signed)

## 2023-04-15 NOTE — Telephone Encounter (Signed)
scheduled

## 2023-04-15 NOTE — Progress Notes (Signed)
Established Patient Office Visit   Subjective  Patient ID: Katie Davis, female    DOB: 1955/07/08  Age: 68 y.o. MRN: [id number]  Chief Complaint  Patient presents with   Acute Visit    Possible sinus infection / Congestion chest tightness inhaler not helping .    She  has a past medical history of Abnormal glandular Papanicolaou smear of cervix, Allergy (2008), Anxiety, Asthma, Body mass index 30.0-30.9, adult, Celiac disease, Chronic vascular disorder of intestine (HCC), Encounter for general adult medical examination with abnormal findings (05/07/2020), Fibromyalgia, GERD (gastroesophageal reflux disease) (14 years ago), Graves disease, Headache, Hyperlipidemia, Hypothyroidism, IBS (irritable bowel syndrome) With constipation, ILD (interstitial lung disease) (HCC), Lupus (systemic lupus erythematosus) (HCC), Obesity, unspecified, Osteoporosis, Raynaud disease, Sjogren's disease (HCC), and Type 2 diabetes mellitus without complications (HCC) (02/25/2022).  Patient complains of productive cough. Patient describes symptoms of shortness of breath cough, fatigue, sinus facial pain, fever  headache, malaise, muscle aches, sore throat, sputum production, and wheezing. Symptoms began weeks ago and are gradually worsening since that time. Treatment thus far includes OTC analgesics/antipyretics: somewhat effective, anti-tussive: somewhat effective, and oral antibiotics per medication orders: Completed Zpack for 5 days on 04/13/23 with mild relief Past pulmonary history is significant for frequent episodes of bronchitis and pneumonia           Review of Systems  Constitutional:  Positive for chills, fever and malaise/fatigue.  HENT:  Positive for congestion, sinus pain and sore throat.   Respiratory:  Positive for cough, sputum production, shortness of breath and wheezing.   Cardiovascular:  Negative for palpitations.  Gastrointestinal:  Positive for nausea and vomiting.  Neurological:   Positive for headaches.      Objective:     BP 120/78   Pulse 66   Ht 5\' 3"  (1.6 m)   Wt 170 lb 1.9 oz (77.2 kg)   SpO2 96%   BMI 30.14 kg/m  BP Readings from Last 3 Encounters:  04/15/23 120/78  02/01/23 121/64  01/27/23 120/76      Physical Exam Vitals reviewed.  Constitutional:      General: She is not in acute distress.    Appearance: Normal appearance. She is not ill-appearing, toxic-appearing or diaphoretic.  HENT:     Head: Normocephalic.     Right Ear: Tympanic membrane normal.     Left Ear: Tympanic membrane normal.     Nose: Congestion and rhinorrhea present.     Mouth/Throat:     Pharynx: Posterior oropharyngeal erythema present.  Eyes:     General:        Right eye: No discharge.        Left eye: No discharge.     Conjunctiva/sclera: Conjunctivae normal.  Cardiovascular:     Rate and Rhythm: Normal rate.     Pulses: Normal pulses.     Heart sounds: Normal heart sounds.  Pulmonary:     Effort: Pulmonary effort is normal. No respiratory distress.     Breath sounds: Wheezing present.  Lymphadenopathy:     Cervical: Cervical adenopathy present.  Skin:    General: Skin is warm and dry.     Capillary Refill: Capillary refill takes less than 2 seconds.  Neurological:     Mental Status: She is alert.  Psychiatric:        Mood and Affect: Mood normal.      No results found for any visits on 04/15/23.  The 10-year ASCVD risk score (Arnett DK, et al., 2019) is:  6.8%    Assessment & Plan:  SOB (shortness of breath) -     DG Chest 2 View; Future  Upper respiratory tract infection, unspecified type Assessment & Plan: Clarithromycin 500 mg twice daily x 7 days, ipratropium (ATROVENT) 0.03 % nasal spray 2 times daily Advise patient to rest to support your body's recovery. Stay hydrated by drinking water, tea, or broth. Using a humidifier can help soothe throat irritation and ease nasal congestion. For fever or pain, acetaminophen (Tylenol) is  recommended. To relieve other symptoms, try saline nasal sprays, throat lozenges, or gargling with saltwater. Focus on eating light, healthy meals like fruits and vegetables to keep your strength up. Practice good hygiene by washing your hands frequently and covering your mouth when coughing or sneezing.Follow-up for worsening or persistent symptoms. Patient verbalizes understanding regarding plan of care and all questions answered      Other orders -     Clarithromycin; Take 1 tablet (500 mg total) by mouth 2 (two) times daily for 7 days.  Dispense: 14 tablet; Refill: 0 -     Ipratropium Bromide; Place 2 sprays into both nostrils every 12 (twelve) hours.  Dispense: 30 mL; Refill: 12    Return if symptoms worsen or fail to improve.   Cruzita Lederer Newman Nip, FNP

## 2023-04-19 ENCOUNTER — Encounter: Payer: Self-pay | Admitting: Internal Medicine

## 2023-04-19 DIAGNOSIS — E039 Hypothyroidism, unspecified: Secondary | ICD-10-CM

## 2023-04-19 NOTE — Telephone Encounter (Signed)
Radiology dept took message to have report read

## 2023-04-20 ENCOUNTER — Other Ambulatory Visit: Payer: Self-pay | Admitting: Internal Medicine

## 2023-04-21 ENCOUNTER — Encounter: Payer: Self-pay | Admitting: Internal Medicine

## 2023-04-21 ENCOUNTER — Other Ambulatory Visit: Payer: Self-pay | Admitting: Internal Medicine

## 2023-04-21 ENCOUNTER — Encounter: Payer: Self-pay | Admitting: Orthopedic Surgery

## 2023-04-21 ENCOUNTER — Ambulatory Visit: Payer: Medicare Other | Admitting: Orthopedic Surgery

## 2023-04-21 VITALS — BP 118/71 | HR 67 | Ht 63.0 in | Wt 171.0 lb

## 2023-04-21 DIAGNOSIS — M79645 Pain in left finger(s): Secondary | ICD-10-CM

## 2023-04-21 MED ORDER — CIPROFLOXACIN HCL 500 MG PO TABS: 500.0000 mg | ORAL_TABLET | Freq: Two times a day (BID) | ORAL | 0 refills | Status: AC

## 2023-04-21 NOTE — Progress Notes (Signed)
 [facility] Return  Assessment: Katie Davis is a 68 y.o. female with the following: Left ring finger pain; history of paronychia   Plan: [name] has pain in the distal aspect of the left ring finger.  She does have a history of paronychia.  She states that this feels similar to prior episodes.  She has some mild redness and tenderness.  No fevers or chills.  We will work to avoid spreading infection.  I provided her with a 7-day course of antibiotics.  She has multiple antibiotic allergies, but does tolerate Cipro .  She will update the clinic.  No follow-up needed.  Meds ordered this encounter  Medications   ciprofloxacin  (CIPRO ) 500 MG tablet    Sig: Take 1 tablet (500 mg total) by mouth 2 (two) times daily for 7 days.    Dispense:  14 tablet    Refill:  0    Body mass index is 30.29 kg/m.  Follow-up: Return if symptoms worsen or fail to improve.   Subjective:  Chief Complaint  Patient presents with   Hand Pain    L hand ring finger for approx 3 wks. Pt states she was getting her nails done and they nicked her cuticle. She has had this happen to a finger on the R hand and ended up on IV antibiotics and had surgery.     History of Present Illness: Katie Davis is a 68 y.o. female who returns to clinic for evaluation of left ring finger pain.  She states that she had a manicure done approximately 3 weeks ago.  She felt as though he nicked her cuticle, and she has been having pain in the distal aspect of the left ring finger ever since.  She has noticed some redness.  Small amount of swelling.  She notes that she had similar type presentation on the right hand, which resulted in an extended course of antibiotics, and did not require an I&D.  As such, she would like to prevent this.  It is not improved.  She denies fevers or chills.  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  Review of Systems: No fevers or chills No numbness or tingling No chest  pain No shortness of breath No bowel or bladder dysfunction No GI distress No headaches   Objective: BP 118/71   Pulse 67   Ht 5' 3 (1.6 m)   Wt 171 lb (77.6 kg)   BMI 30.29 kg/m   Physical Exam:  Left hand without deformity.  Mild redness to the distal aspect of the ring finger.  Tenderness palpation at the DIP joint and distal.  There is no fluctuance.  No fluid collection.  Sensation is intact distally.  IMAGING: I personally ordered and reviewed the following images:  No new imaging obtained today.  Oneil DELENA Horde, MD 04/21/2023 10:34 AM

## 2023-04-22 ENCOUNTER — Other Ambulatory Visit: Payer: Self-pay | Admitting: Internal Medicine

## 2023-04-28 ENCOUNTER — Encounter: Payer: Self-pay | Admitting: Orthopedic Surgery

## 2023-04-29 ENCOUNTER — Ambulatory Visit: Payer: Medicare Other | Admitting: Orthopedic Surgery

## 2023-04-29 ENCOUNTER — Ambulatory Visit: Payer: Medicare Other

## 2023-04-30 ENCOUNTER — Ambulatory Visit: Payer: Medicare Other | Admitting: Orthopedic Surgery

## 2023-04-30 ENCOUNTER — Other Ambulatory Visit (INDEPENDENT_AMBULATORY_CARE_PROVIDER_SITE_OTHER): Payer: Self-pay

## 2023-04-30 DIAGNOSIS — M79645 Pain in left finger(s): Secondary | ICD-10-CM

## 2023-04-30 DIAGNOSIS — M19042 Primary osteoarthritis, left hand: Secondary | ICD-10-CM

## 2023-05-02 ENCOUNTER — Encounter: Payer: Self-pay | Admitting: Internal Medicine

## 2023-05-03 NOTE — Progress Notes (Signed)
Nurse visit, xray taken.

## 2023-05-04 DIAGNOSIS — E039 Hypothyroidism, unspecified: Secondary | ICD-10-CM | POA: Diagnosis not present

## 2023-05-04 DIAGNOSIS — H04123 Dry eye syndrome of bilateral lacrimal glands: Secondary | ICD-10-CM | POA: Diagnosis not present

## 2023-05-05 LAB — TSH+T4F+T3FREE
Free T4: 1.41 ng/dL (ref 0.82–1.77)
T3, Free: 2.8 pg/mL (ref 2.0–4.4)
TSH: 0.906 u[IU]/mL (ref 0.450–4.500)

## 2023-05-11 ENCOUNTER — Encounter: Payer: Self-pay | Admitting: Orthopedic Surgery

## 2023-05-16 ENCOUNTER — Encounter: Payer: Self-pay | Admitting: Internal Medicine

## 2023-05-17 NOTE — Assessment & Plan Note (Signed)
 Presenting today for an acute visit endorsing symptoms described above.  She recently had a viral URI.  Symptoms temporarily improved but recently returned.  She currently endorses maxillary sinus congestion with pain/pressure and a cough productive of dark yellow sputum.  She endorses subjective fever but has not checked her temperature.  Concern for superimposed bacterial infection in the setting of recent viral URI. -Treatment options reviewed.  Z-Pak prescribed for empiric antibiotic coverage.  Recommend as needed use of over-the-counter cough and cold medications.  We also discussed with routine use of nasal saline rinse followed by fluticasone nasal spray.  She will return to care if symptoms worsen or fail to improve.

## 2023-05-18 NOTE — Telephone Encounter (Signed)
 Appt scheduled

## 2023-05-20 ENCOUNTER — Ambulatory Visit: Payer: Self-pay | Admitting: Internal Medicine

## 2023-06-02 ENCOUNTER — Other Ambulatory Visit: Payer: Self-pay | Admitting: Internal Medicine

## 2023-06-02 ENCOUNTER — Encounter: Payer: Self-pay | Admitting: Internal Medicine

## 2023-06-02 MED ORDER — FLUTICASONE PROPIONATE 50 MCG/ACT NA SUSP: 1.0000 | Freq: Every day | NASAL | 0 refills | Status: AC | PRN

## 2023-06-02 MED ORDER — CLONAZEPAM 0.5 MG PO TABS: 0.5000 mg | ORAL_TABLET | Freq: Two times a day (BID) | ORAL | 0 refills | Status: DC | PRN

## 2023-06-02 MED ORDER — FLUTICASONE FUROATE-VILANTEROL 100-25 MCG/ACT IN AEPB: 1.0000 | INHALATION_SPRAY | Freq: Every day | RESPIRATORY_TRACT | 0 refills | Status: DC

## 2023-06-11 ENCOUNTER — Encounter: Payer: Self-pay | Admitting: Internal Medicine

## 2023-06-16 ENCOUNTER — Ambulatory Visit: Payer: Self-pay | Admitting: Internal Medicine

## 2023-06-21 ENCOUNTER — Encounter: Payer: Self-pay | Admitting: Internal Medicine

## 2023-06-21 DIAGNOSIS — R7303 Prediabetes: Secondary | ICD-10-CM

## 2023-06-21 DIAGNOSIS — M810 Age-related osteoporosis without current pathological fracture: Secondary | ICD-10-CM

## 2023-06-21 DIAGNOSIS — E039 Hypothyroidism, unspecified: Secondary | ICD-10-CM

## 2023-06-21 DIAGNOSIS — E782 Mixed hyperlipidemia: Secondary | ICD-10-CM

## 2023-06-23 ENCOUNTER — Telehealth: Payer: Self-pay

## 2023-06-23 NOTE — Telephone Encounter (Signed)
 Spoke to [employer]

## 2023-06-23 NOTE — Telephone Encounter (Signed)
 Copied from CRM [id number]. Topic: Clinical - Prescription Issue >> Jun 22, 2023  4:26 PM Shelah Lewandowsky wrote: Reason for CRM: Katie Davis with [facility]- request denied for linaclotide (LINZESS) 145 MCG CAPS capsule- denied tier exception to lower price

## 2023-06-25 ENCOUNTER — Encounter: Payer: Self-pay | Admitting: Internal Medicine

## 2023-06-25 ENCOUNTER — Encounter: Payer: Self-pay | Admitting: Cardiology

## 2023-06-25 ENCOUNTER — Ambulatory Visit: Payer: Medicare Other | Attending: Cardiology | Admitting: Cardiology

## 2023-06-25 ENCOUNTER — Other Ambulatory Visit: Payer: Self-pay | Admitting: Internal Medicine

## 2023-06-25 VITALS — BP 136/80 | HR 72 | Ht 63.0 in | Wt 171.4 lb

## 2023-06-25 DIAGNOSIS — R0989 Other specified symptoms and signs involving the circulatory and respiratory systems: Secondary | ICD-10-CM

## 2023-06-25 DIAGNOSIS — I2510 Atherosclerotic heart disease of native coronary artery without angina pectoris: Secondary | ICD-10-CM

## 2023-06-25 DIAGNOSIS — R011 Cardiac murmur, unspecified: Secondary | ICD-10-CM

## 2023-06-25 DIAGNOSIS — I447 Left bundle-branch block, unspecified: Secondary | ICD-10-CM

## 2023-06-25 MED ORDER — SYNTHROID 112 MCG PO TABS: 112.0000 ug | ORAL_TABLET | ORAL | 1 refills | Status: AC

## 2023-06-25 NOTE — Progress Notes (Signed)
 Clinical Summary Ms. Gall is a 68 y.o.female seen today for follow up of the following medical problems.    1.Coronary artery calcifications/atherosclerosis - noted by 02/2021 chest CT - no chest pains. SOB is improving with resolving COVID infection.  CAD risk factor: hyperlipidemia - she is already on statin  -Jan 2023 nuclear stress: artifact vs very small area of ischemia   - no recent chest pains.  - chronic SOB related to her lung disease, intermittent wheezing.     2. Abnormal CT scan - 02/2021 CT scan showed enlarged pulmonic trunk, suggesting pulm HTN - 02/2021 echo LVEF 55-60%, normal diastolic, normal RV     3. Interstitial lung disease - followed by pulmonary     4. Severe persistent asthma - followed by pulmonary   5. LBBB - unclear chronicity, at least since 2013 based on care everywhere EKG reports -  02/2021 echo LVEF 55-60%, normal diastolic, normal RV - Jan 2023 nuclear stress was benign   6. Hyperlipidemia - LDL 01/2021 100 - crestor causes some joint pains. Taking rosuvatatin 5mg  4 times a week. When takes daily causes muscle aches and joint pains - 11/2022 TC 138 TG 177 HDL 42 LDL 66   7. Celiac artery stenosis - seen by vascular, no recs for additional imaging. Can see just as needed.     Past Medical History:  Diagnosis Date   Abnormal glandular Papanicolaou smear of cervix    Allergy 2008   Latex ..after surgery   Anxiety    Asthma    Body mass index 30.0-30.9, adult    Celiac disease    Chronic vascular disorder of intestine (HCC)    Encounter for general adult medical examination with abnormal findings 05/07/2020   Fibromyalgia    GERD (gastroesophageal reflux disease) 14 years ago   Graves disease    Headache    Migrains   Hyperlipidemia    Hypothyroidism    IBS (irritable bowel syndrome) With constipation    ILD (interstitial lung disease) (HCC)    Lupus (systemic lupus erythematosus) (HCC)    Obesity, unspecified     Osteoporosis    Raynaud disease    Sjogren's disease (HCC)    Type 2 diabetes mellitus without complications (HCC) 02/25/2022     Allergies  Allergen Reactions   Prednisone Palpitations   Bactrim [Sulfamethoxazole-Trimethoprim] Nausea And Vomiting    Migraines    Codeine Nausea And Vomiting   Denosumab     Other reaction(s): Unknown   Gluten Meal Other (See Comments)    Ceiliac disease   Influenza Vaccines Swelling   Keflex [Cephalexin] Nausea And Vomiting   Levaquin [Levofloxacin] Nausea And Vomiting   Sulfa Antibiotics Nausea And Vomiting   Contrast Media [Iodinated Contrast Media] Nausea And Vomiting and Rash    Per patient, did fine with premedication with IV steroids before    Doxycycline Palpitations    Made body feel crazy, increased heart rate   Latex Hives and Rash     Current Outpatient Medications  Medication Sig Dispense Refill   acidophilus (RISAQUAD) CAPS capsule Take 1 capsule by mouth daily.     albuterol (VENTOLIN HFA) 108 (90 Base) MCG/ACT inhaler Inhale 1-2 puffs into the lungs every 6 (six) hours as needed for wheezing or shortness of breath.     aspirin EC 81 MG tablet Take 1 tablet (81 mg total) by mouth daily. Swallow whole. 90 tablet 3   atenolol (TENORMIN) 50 MG tablet Take  1 tablet (50 mg total) by mouth 2 (two) times daily. 180 tablet 3   CALCIUM CARBONATE ANTACID PO Take 2 tablets by mouth daily.     Cholecalciferol (VITAMIN D3 GUMMIES PO) Take 6,000 Units by mouth daily. 2000 units     clonazePAM (KLONOPIN) 0.5 MG tablet Take 1 tablet (0.5 mg total) by mouth 2 (two) times daily as needed for anxiety. 60 tablet 0   ELDERBERRY PO Take 200 mg by mouth daily. 100 mg     fluticasone (FLONASE) 50 MCG/ACT nasal spray Place 1 spray into both nostrils daily as needed (Sinus). 9.9 mL 0   fluticasone furoate-vilanterol (BREO ELLIPTA) 100-25 MCG/ACT AEPB Inhale 1 puff into the lungs daily. Alternates when needed 30 each 0   fluticasone furoate-vilanterol  (BREO ELLIPTA) 200-25 MCG/ACT AEPB Inhale 1 puff into the lungs daily. 30 each 1   ibuprofen (ADVIL) 800 MG tablet Take 1 tablet (800 mg total) by mouth every 8 (eight) hours as needed for moderate pain. 30 tablet 0   ipratropium (ATROVENT) 0.03 % nasal spray Place 2 sprays into both nostrils every 12 (twelve) hours. 30 mL 12   linaclotide (LINZESS) 145 MCG CAPS capsule Take 1 capsule (145 mcg total) by mouth daily before breakfast. 90 capsule 3   Omega-3 Fatty Acids (FISH OIL) 1000 MG CAPS Take 2,000 mg by mouth daily.     ondansetron (ZOFRAN) 4 MG tablet Take 1 tablet (4 mg total) by mouth every 8 (eight) hours as needed. 30 tablet 3   pantoprazole (PROTONIX) 40 MG tablet TAKE 1 TABLET BY MOUTH ONCE DAILY BEFORE BREAKFAST 90 tablet 1   Perfluorohexyloctane (MIEBO) 1.338 GM/ML SOLN Apply to eye.     Polyethyl Glyc-Propyl Glyc PF (SYSTANE HYDRATION PF) 0.4-0.3 % SOLN Place 1 drop into both eyes daily as needed (Dry eye).     rosuvastatin (CRESTOR) 5 MG tablet Take 1 tablet (5 mg total) by mouth every Monday, Wednesday, and Friday. 90 tablet 0   SPIKEVAX syringe Inject 0.5 mLs into the muscle once.     SYNTHROID 112 MCG tablet Take 1 tablet (112 mcg total) by mouth See admin instructions. Alternates every other day with 100 mcg - Brand Name 90 tablet 1   No current facility-administered medications for this visit.     Past Surgical History:  Procedure Laterality Date   APPENDECTOMY     CHOLECYSTECTOMY     COLONOSCOPY WITH PROPOFOL N/A 06/10/2020   Surgeon: Lanelle Bal, DO;  diverticulosis and nonbleeding internal hemorrhoids, due for repeat in 2032.   DG THUMB RIGHT HAND ([facility] HX)     EYE SURGERY  2019   Cataract. Right eye   little finger Right    placement of pin   OTHER SURGICAL HISTORY     biopsies on both feet   REDUCTION MAMMAPLASTY       Allergies  Allergen Reactions   Prednisone Palpitations   Bactrim [Sulfamethoxazole-Trimethoprim] Nausea And Vomiting    Migraines     Codeine Nausea And Vomiting   Denosumab     Other reaction(s): Unknown   Gluten Meal Other (See Comments)    Ceiliac disease   Influenza Vaccines Swelling   Keflex [Cephalexin] Nausea And Vomiting   Levaquin [Levofloxacin] Nausea And Vomiting   Sulfa Antibiotics Nausea And Vomiting   Contrast Media [Iodinated Contrast Media] Nausea And Vomiting and Rash    Per patient, did fine with premedication with IV steroids before    Doxycycline Palpitations  Made body feel crazy, increased heart rate   Latex Hives and Rash      Family History  Problem Relation Age of Onset   Congestive Heart Failure Mother    Arthritis Mother    Aneurysm Father    Celiac disease Brother    Melanoma Brother    Aneurysm Brother        stomach   Asthma Brother    Cancer Brother    Stroke Sister    Celiac disease Sister    Irritable bowel syndrome Sister    Cancer Sister    Celiac disease Brother    Aneurysm Sister    Other Sister        multiple back surgeries   Colon cancer Neg Hx      Social History Ms. Hupfer reports that she has never smoked. She has never been exposed to tobacco smoke. She has never used smokeless tobacco. Ms. Iversen reports that she does not currently use alcohol.     Physical Examination Today's Vitals   06/25/23 1258  BP: 136/80  Pulse: 72  SpO2: 96%  Weight: 171 lb 6.4 oz (77.7 kg)  Height: 5\' 3"  (1.6 m)   Body mass index is 30.36 kg/m.  Gen: resting comfortably, no acute distress HEENT: no scleral icterus, pupils equal round and reactive, no palptable cervical adenopathy,  CV: RRR, 2/6 systolic murmur rusb. Bilateral carotid bruits Resp: Clear to auscultation bilaterally GI: abdomen is soft, non-tender, non-distended, normal bowel sounds, no hepatosplenomegaly MSK: extremities are warm, no edema.  Skin: warm, no rash Neuro:  no focal deficits Psych: appropriate affect   Diagnostic Studies  Jan 2023 nuclear stress  The study is low risk.   No  ST deviation was noted.   LV perfusion is abnormal. Defect 1: There is a small defect with mild reduction in uptake present in the apical to mid anteroseptal location(s) and apex that is partially reversible. There is normal wall motion in the defect area. May be consistent with artifact given normal wall motion vs small area of ischemia. Overall low risk.   Left ventricular function is normal. End diastolic cavity size is normal.   Assessment and Plan  1.Coronary atherosclerosis/LBBB - coronary atherosclerosis noted on CT scan - denies chest pain, chronic SOB related to lung disease though improving - LBBB on EKG that is chrnoic - lexiscan was low risk - no recent symptoms, continue risk factor modification   2. Hyperlipidemia - LDL at goal, discussed dietary changes to lower TGs  3. Heart murmur - aortic sclerosis by echo 3 years ago, new murmur on exam suspect may have progressed - order echo  4. Carotid bruits - obtain carotid US  EKG today shows SR, chronic LBBB   F/u 1 yaer     Antoine Poche, M.D.

## 2023-06-25 NOTE — Patient Instructions (Signed)
 Medication Instructions:  Your physician recommends that you continue on your current medications as directed. Please refer to the Current Medication list given to you today.  *If you need a refill on your cardiac medications before your next appointment, please call your pharmacy*  Lab Work: None If you have labs (blood work) drawn today and your tests are completely normal, you will receive your results only by: MyChart Message (if you have MyChart) OR A paper copy in the mail If you have any lab test that is abnormal or we need to change your treatment, we will call you to review the results.  Testing/Procedures: Your physician has requested that you have an echocardiogram. Echocardiography is a painless test that uses sound waves to create images of your heart. It provides your doctor with information about the size and shape of your heart and how well your heart's chambers and valves are working. This procedure takes approximately one hour. There are no restrictions for this procedure. Please do NOT wear cologne, perfume, aftershave, or lotions (deodorant is allowed). Please arrive 15 minutes prior to your appointment time.  Please note: We ask at that you not bring children with you during ultrasound (echo/ vascular) testing. Due to room size and safety concerns, children are not allowed in the ultrasound rooms during exams. Our front office staff cannot provide observation of children in our lobby area while testing is being conducted. An adult accompanying a patient to their appointment will only be allowed in the ultrasound room at the discretion of the ultrasound technician under special circumstances. We apologize for any inconvenience.  Your physician has requested that you have a carotid duplex. This test is an ultrasound of the carotid arteries in your neck. It looks at blood flow through these arteries that supply the brain with blood. Allow one hour for this exam. There are no  restrictions or special instructions.   Follow-Up: At [facility], you and your health needs are our priority.  As part of our continuing mission to provide you with exceptional heart care, our providers are all part of one team.  This team includes your primary Cardiologist (physician) and Advanced Practice Providers or APPs (Physician Assistants and Nurse Practitioners) who all work together to provide you with the care you need, when you need it.  Your next appointment:   1 year(s)  Provider:   You may see Dina Rich, MD or one of the following Advanced Practice Providers on your designated Care Team:   Randall An, PA-C  Scotesia Xenia, [place] Jacolyn Reedy, [place]     We recommend signing up for the patient portal called "MyChart".  Sign up information is provided on this After Visit Summary.  MyChart is used to connect with patients for Virtual Visits (Telemedicine).  Patients are able to view lab/test results, encounter notes, upcoming appointments, etc.  Non-urgent messages can be sent to your provider as well.   To learn more about what you can do with MyChart, go to [email or web address].   Other Instructions

## 2023-07-02 DIAGNOSIS — M79645 Pain in left finger(s): Secondary | ICD-10-CM | POA: Diagnosis not present

## 2023-07-02 DIAGNOSIS — M19042 Primary osteoarthritis, left hand: Secondary | ICD-10-CM | POA: Diagnosis not present

## 2023-07-05 ENCOUNTER — Ambulatory Visit (HOSPITAL_COMMUNITY)
Admission: RE | Admit: 2023-07-05 | Discharge: 2023-07-05 | Disposition: A | Discharge status: Home / Self Care | Source: Ambulatory Visit | Attending: Cardiology | Admitting: Cardiology

## 2023-07-05 ENCOUNTER — Telehealth: Payer: Self-pay | Admitting: Orthopedic Surgery

## 2023-07-05 DIAGNOSIS — R0989 Other specified symptoms and signs involving the circulatory and respiratory systems: Secondary | ICD-10-CM

## 2023-07-05 DIAGNOSIS — E119 Type 2 diabetes mellitus without complications: Secondary | ICD-10-CM | POA: Diagnosis not present

## 2023-07-05 DIAGNOSIS — E785 Hyperlipidemia, unspecified: Secondary | ICD-10-CM | POA: Diagnosis not present

## 2023-07-05 DIAGNOSIS — I6523 Occlusion and stenosis of bilateral carotid arteries: Secondary | ICD-10-CM | POA: Diagnosis not present

## 2023-07-05 NOTE — Telephone Encounter (Signed)
 Dr. Ernesta Heading pt - pt lvm stating that she wants Leta to call her.  [phone]

## 2023-07-07 NOTE — Telephone Encounter (Signed)
Closing encounter.  Nothing further needed.

## 2023-07-08 NOTE — Telephone Encounter (Signed)
 Called and left VM to callback if she still has questions.

## 2023-07-13 ENCOUNTER — Ambulatory Visit (HOSPITAL_COMMUNITY): Admission: RE | Admit: 2023-07-13 | Source: Ambulatory Visit

## 2023-07-13 ENCOUNTER — Ambulatory Visit (HOSPITAL_COMMUNITY)

## 2023-07-14 DIAGNOSIS — D485 Neoplasm of uncertain behavior of skin: Secondary | ICD-10-CM | POA: Diagnosis not present

## 2023-07-14 DIAGNOSIS — L570 Actinic keratosis: Secondary | ICD-10-CM | POA: Diagnosis not present

## 2023-07-14 DIAGNOSIS — D225 Melanocytic nevi of trunk: Secondary | ICD-10-CM | POA: Diagnosis not present

## 2023-07-14 DIAGNOSIS — X32XXXD Exposure to sunlight, subsequent encounter: Secondary | ICD-10-CM | POA: Diagnosis not present

## 2023-07-14 DIAGNOSIS — Z1283 Encounter for screening for malignant neoplasm of skin: Secondary | ICD-10-CM | POA: Diagnosis not present

## 2023-07-16 ENCOUNTER — Encounter: Payer: Self-pay | Admitting: Internal Medicine

## 2023-07-16 DIAGNOSIS — E039 Hypothyroidism, unspecified: Secondary | ICD-10-CM | POA: Diagnosis not present

## 2023-07-16 DIAGNOSIS — R7303 Prediabetes: Secondary | ICD-10-CM | POA: Diagnosis not present

## 2023-07-16 DIAGNOSIS — M810 Age-related osteoporosis without current pathological fracture: Secondary | ICD-10-CM | POA: Diagnosis not present

## 2023-07-16 DIAGNOSIS — E782 Mixed hyperlipidemia: Secondary | ICD-10-CM | POA: Diagnosis not present

## 2023-07-17 LAB — CBC WITH DIFFERENTIAL/PLATELET
Basophils Absolute: 0 10*3/uL (ref 0.0–0.2)
Basos: 1 %
EOS (ABSOLUTE): 0.1 10*3/uL (ref 0.0–0.4)
Eos: 2 %
Hematocrit: 42 % (ref 34.0–46.6)
Hemoglobin: 14.4 g/dL (ref 11.1–15.9)
Immature Grans (Abs): 0 10*3/uL (ref 0.0–0.1)
Immature Granulocytes: 0 %
Lymphocytes Absolute: 1.3 10*3/uL (ref 0.7–3.1)
Lymphs: 29 %
MCH: 32.4 pg (ref 26.6–33.0)
MCHC: 34.3 g/dL (ref 31.5–35.7)
MCV: 94 fL (ref 79–97)
Monocytes Absolute: 0.3 10*3/uL (ref 0.1–0.9)
Monocytes: 6 %
Neutrophils Absolute: 2.8 10*3/uL (ref 1.4–7.0)
Neutrophils: 62 %
Platelets: 234 10*3/uL (ref 150–450)
RBC: 4.45 x10E6/uL (ref 3.77–5.28)
RDW: 13.3 % (ref 11.7–15.4)
WBC: 4.5 10*3/uL (ref 3.4–10.8)

## 2023-07-17 LAB — CMP14+EGFR
ALT: 15 IU/L (ref 0–32)
AST: 19 IU/L (ref 0–40)
Albumin: 4.5 g/dL (ref 3.9–4.9)
Alkaline Phosphatase: 86 IU/L (ref 44–121)
BUN/Creatinine Ratio: 29 — ABNORMAL HIGH (ref 12–28)
BUN: 17 mg/dL (ref 8–27)
Bilirubin Total: 0.3 mg/dL (ref 0.0–1.2)
CO2: 19 mmol/L — ABNORMAL LOW (ref 20–29)
Calcium: 9.7 mg/dL (ref 8.7–10.3)
Chloride: 104 mmol/L (ref 96–106)
Creatinine, Ser: 0.59 mg/dL (ref 0.57–1.00)
Globulin, Total: 2.9 g/dL (ref 1.5–4.5)
Glucose: 103 mg/dL — ABNORMAL HIGH (ref 70–99)
Potassium: 4.3 mmol/L (ref 3.5–5.2)
Sodium: 141 mmol/L (ref 134–144)
Total Protein: 7.4 g/dL (ref 6.0–8.5)
eGFR: 99 mL/min/{1.73_m2} (ref >59)

## 2023-07-17 LAB — LIPID PANEL
Chol/HDL Ratio: 4.5 ratio — ABNORMAL HIGH (ref 0.0–4.4)
Cholesterol, Total: 187 mg/dL (ref 100–199)
HDL: 42 mg/dL (ref >39)
LDL Chol Calc (NIH): 115 mg/dL — ABNORMAL HIGH (ref 0–99)
Triglycerides: 169 mg/dL — ABNORMAL HIGH (ref 0–149)
VLDL Cholesterol Cal: 30 mg/dL (ref 5–40)

## 2023-07-17 LAB — TSH+T4F+T3FREE
Free T4: 1.41 ng/dL (ref 0.82–1.77)
T3, Free: 2.7 pg/mL (ref 2.0–4.4)
TSH: 2.78 u[IU]/mL (ref 0.450–4.500)

## 2023-07-17 LAB — HEMOGLOBIN A1C
Est. average glucose Bld gHb Est-mCnc: 114 mg/dL
Hgb A1c MFr Bld: 5.6 % (ref 4.8–5.6)

## 2023-07-17 LAB — B12 AND FOLATE PANEL
Folate: 14.5 ng/mL (ref >3.0)
Vitamin B-12: 536 pg/mL (ref 232–1245)

## 2023-07-17 LAB — VITAMIN D 25 HYDROXY (VIT D DEFICIENCY, FRACTURES): Vit D, 25-Hydroxy: 61.8 ng/mL (ref 30.0–100.0)

## 2023-07-18 ENCOUNTER — Other Ambulatory Visit: Payer: Self-pay | Admitting: Internal Medicine

## 2023-07-18 ENCOUNTER — Encounter: Payer: Self-pay | Admitting: Internal Medicine

## 2023-07-19 MED ORDER — MIEBO 1.338 GM/ML OP SOLN
1.0000 | OPHTHALMIC | 0 refills | Status: AC | PRN
Start: 2023-07-19 — End: ?

## 2023-07-19 MED ORDER — CLONAZEPAM 0.5 MG PO TABS: 0.5000 mg | ORAL_TABLET | Freq: Two times a day (BID) | ORAL | 0 refills | Status: DC | PRN

## 2023-07-20 ENCOUNTER — Encounter: Payer: Self-pay | Admitting: Internal Medicine

## 2023-07-20 ENCOUNTER — Ambulatory Visit (INDEPENDENT_AMBULATORY_CARE_PROVIDER_SITE_OTHER): Payer: Medicare Other | Admitting: Internal Medicine

## 2023-07-20 VITALS — BP 123/77 | HR 63 | Ht 63.0 in | Wt 170.8 lb

## 2023-07-20 DIAGNOSIS — R7303 Prediabetes: Secondary | ICD-10-CM | POA: Diagnosis not present

## 2023-07-20 DIAGNOSIS — E782 Mixed hyperlipidemia: Secondary | ICD-10-CM

## 2023-07-20 DIAGNOSIS — E039 Hypothyroidism, unspecified: Secondary | ICD-10-CM

## 2023-07-20 DIAGNOSIS — Z636 Dependent relative needing care at home: Secondary | ICD-10-CM | POA: Diagnosis not present

## 2023-07-20 MED ORDER — ROSUVASTATIN CALCIUM 5 MG PO TABS: 5.0000 mg | ORAL_TABLET | ORAL | 1 refills | Status: AC

## 2023-07-20 NOTE — Assessment & Plan Note (Signed)
 Followed by endocrinology (Dr. Kathyanne Parkers).  Thyroid  studies within normal limits when updated last week.  She remains on alternating doses of levothyroxine  112 and 100 mcg.

## 2023-07-20 NOTE — Assessment & Plan Note (Signed)
 Lipid panel updated last week.  Total cholesterol 187 and LDL 115.  Both values are significantly increased from September 2024.  She is currently prescribed rosuvastatin  5 mg 3 times weekly but acknowledges intermittent compliance and poor dieting habits.  She plans to gradually increase the frequency of rosuvastatin  and focus on dietary changes in an effort to improve her cholesterol.  Rosuvastatin  refilled today.

## 2023-07-20 NOTE — Assessment & Plan Note (Signed)
 Katie Davis is understandably emotional when discussing her husband's health and recognizes that it is starting to take a toll on her health as well.  She has previously seen a counselor and felt that it was beneficial but is not currently followed by counseling.  I offered to place a referral today but she has declined for now.  She will follow-up if interested in counseling services.

## 2023-07-20 NOTE — Assessment & Plan Note (Signed)
 A1c has improved to 5.6 and is no longer in the prediabetic range.  She was congratulated on her progress and encouraged to maintain lifestyle modifications aimed at weight loss.

## 2023-07-20 NOTE — Patient Instructions (Signed)
 It was a pleasure to see you today.  Thank you for giving us  the opportunity to be involved in your care.  Below is a brief recap of your visit and next steps.  We will plan to see you again in 6 months.  Summary I agree with increasing the frequency of rosuvastatin  Follow up in 6 months

## 2023-07-20 NOTE — Progress Notes (Signed)
 Established Patient Office Visit  Subjective   Patient ID: Katie Davis, female    DOB: [birth year]  Age: 68 y.o. MRN: [id number]  Chief Complaint  Patient presents with   Care Management    Follow up    Katie Davis returns to care today for routine follow-up.  She was last evaluated by me in November 2024.  She was treated with a Z-Pak for bacterial rhinosinusitis.  No additional medication changes were made and 28-month follow-up was arranged.  In the interim, she has been seen by endocrinology and cardiology for follow-up.  Acute visits at [facility] on 1/21 and 1/30 in the setting of URI.  Evaluated by orthopedic surgery 2/5 endorsing left ring finger paronychia.  Today she reports feeling fairly well.  She is understandably emotional when discussing her husband's health.  She does not have any acute concerns to discuss today.  Past Medical History:  Diagnosis Date   Abnormal glandular Papanicolaou smear of cervix    Allergy 2008   Latex ..after surgery   Anxiety    Asthma    Body mass index 30.0-30.9, adult    Celiac disease    Chronic vascular disorder of intestine (HCC)    Encounter for general adult medical examination with abnormal findings 05/07/2020   Fibromyalgia    GERD (gastroesophageal reflux disease) 14 years ago   Graves disease    Headache    Migrains   Hyperlipidemia    Hypothyroidism    IBS (irritable bowel syndrome) With constipation    ILD (interstitial lung disease) (HCC)    Lupus (systemic lupus erythematosus) (HCC)    Obesity, unspecified    Osteoporosis    Raynaud disease    Sjogren's disease (HCC)    Type 2 diabetes mellitus without complications (HCC) 02/25/2022   Past Surgical History:  Procedure Laterality Date   APPENDECTOMY     CHOLECYSTECTOMY     COLONOSCOPY WITH PROPOFOL  N/A 06/10/2020   Surgeon: Vinetta Greening, DO;  diverticulosis and nonbleeding internal hemorrhoids, due for repeat in 2032.   DG THUMB RIGHT HAND ([facility] HX)     EYE SURGERY   2019   Cataract. Right eye   little finger Right    placement of pin   OTHER SURGICAL HISTORY     biopsies on both feet   REDUCTION MAMMAPLASTY     Social History   Tobacco Use   Smoking status: Never    Passive exposure: Never   Smokeless tobacco: Never  Vaping Use   Vaping status: Never Used  Substance Use Topics   Alcohol use: Not Currently    Comment: wine occ   Drug use: Never   Family History  Problem Relation Age of Onset   Congestive Heart Failure Mother    Arthritis Mother    Aneurysm Father    Celiac disease Brother    Melanoma Brother    Aneurysm Brother        stomach   Asthma Brother    Cancer Brother    Stroke Sister    Celiac disease Sister    Irritable bowel syndrome Sister    Cancer Sister    Celiac disease Brother    Aneurysm Sister    Other Sister        multiple back surgeries   Colon cancer Neg Hx    Allergies  Allergen Reactions   Prednisone Palpitations   Bactrim [Sulfamethoxazole-Trimethoprim] Nausea And Vomiting    Migraines    Codeine Nausea And Vomiting  Denosumab      Other reaction(s): Unknown   Gluten Meal Other (See Comments)    Ceiliac disease   Influenza Vaccines Swelling   Keflex [Cephalexin] Nausea And Vomiting   Levaquin [Levofloxacin] Nausea And Vomiting   Sulfa Antibiotics Nausea And Vomiting   Contrast Media [Iodinated Contrast Media] Nausea And Vomiting and Rash    Per patient, did fine with premedication with IV steroids before    Doxycycline Palpitations    Made body feel crazy, increased heart rate   Latex Hives and Rash   Review of Systems  Constitutional:  Negative for chills and fever.  HENT:  Negative for sore throat.   Respiratory:  Negative for cough and shortness of breath.   Cardiovascular:  Negative for chest pain, palpitations and leg swelling.  Gastrointestinal:  Negative for abdominal pain, blood in stool, constipation, diarrhea, nausea and vomiting.  Genitourinary:  Negative for dysuria and  hematuria.  Musculoskeletal:  Negative for myalgias.  Skin:  Negative for itching and rash.  Neurological:  Negative for dizziness and headaches.  Psychiatric/Behavioral:  Negative for depression and suicidal ideas.      Objective:     BP 123/77   Pulse 63   Ht 5\' 3"  (1.6 m)   Wt 170 lb 12.8 oz (77.5 kg)   SpO2 96%   BMI 30.26 kg/m  BP Readings from Last 3 Encounters:  07/20/23 123/77  06/25/23 136/80  04/21/23 118/71   Physical Exam Vitals reviewed.  Constitutional:      General: She is not in acute distress.    Appearance: Normal appearance. She is not toxic-appearing.  HENT:     Head: Normocephalic and atraumatic.     Right Ear: External ear normal.     Left Ear: External ear normal.     Nose: Nose normal. No congestion or rhinorrhea.     Mouth/Throat:     Mouth: Mucous membranes are moist.     Pharynx: Oropharynx is clear. No oropharyngeal exudate or posterior oropharyngeal erythema.  Eyes:     General: No scleral icterus.    Extraocular Movements: Extraocular movements intact.     Conjunctiva/sclera: Conjunctivae normal.     Pupils: Pupils are equal, round, and reactive to light.  Cardiovascular:     Rate and Rhythm: Normal rate and regular rhythm.     Pulses: Normal pulses.     Heart sounds: Murmur heard.     No friction rub. No gallop.  Pulmonary:     Effort: Pulmonary effort is normal.     Breath sounds: Normal breath sounds. No wheezing, rhonchi or rales.  Abdominal:     General: Abdomen is flat. Bowel sounds are normal. There is no distension.     Palpations: Abdomen is soft.     Tenderness: There is no abdominal tenderness.  Musculoskeletal:        General: No swelling. Normal range of motion.     Cervical back: Normal range of motion.     Right lower leg: No edema.     Left lower leg: No edema.  Lymphadenopathy:     Cervical: No cervical adenopathy.  Skin:    General: Skin is warm and dry.     Capillary Refill: Capillary refill takes less than  2 seconds.     Coloration: Skin is not jaundiced.  Neurological:     General: No focal deficit present.     Mental Status: She is alert and oriented to person, place, and time.  Psychiatric:  Mood and Affect: Mood normal.        Behavior: Behavior normal.   Last CBC Lab Results  Component Value Date   WBC 4.5 07/16/2023   HGB 14.4 07/16/2023   HCT 42.0 07/16/2023   MCV 94 07/16/2023   MCH 32.4 07/16/2023   RDW 13.3 07/16/2023   PLT 234 07/16/2023   Last metabolic panel Lab Results  Component Value Date   GLUCOSE 103 (H) 07/16/2023   NA 141 07/16/2023   K 4.3 07/16/2023   CL 104 07/16/2023   CO2 19 (L) 07/16/2023   BUN 17 07/16/2023   CREATININE 0.59 07/16/2023   EGFR 99 07/16/2023   CALCIUM  9.7 07/16/2023   PROT 7.4 07/16/2023   ALBUMIN 4.5 07/16/2023   LABGLOB 2.9 07/16/2023   AGRATIO 1.4 03/18/2022   BILITOT 0.3 07/16/2023   ALKPHOS 86 07/16/2023   AST 19 07/16/2023   ALT 15 07/16/2023   Last lipids Lab Results  Component Value Date   CHOL 187 07/16/2023   HDL 42 07/16/2023   LDLCALC 115 (H) 07/16/2023   TRIG 169 (H) 07/16/2023   CHOLHDL 4.5 (H) 07/16/2023   Last hemoglobin A1c Lab Results  Component Value Date   HGBA1C 5.6 07/16/2023   Last thyroid  functions Lab Results  Component Value Date   TSH 2.780 07/16/2023   T4TOTAL 11.1 01/06/2023   Last vitamin D  Lab Results  Component Value Date   VD25OH 61.8 07/16/2023   Last vitamin B12 and Folate Lab Results  Component Value Date   VITAMINB12 536 07/16/2023   FOLATE 14.5 07/16/2023   The 10-year ASCVD risk score (Arnett DK, et al., 2019) is: 9%    Assessment & Plan:   Problem List Items Addressed This Visit       Hypothyroidism   Followed by endocrinology (Dr. Kathyanne Parkers).  Thyroid  studies within normal limits when updated last week.  She remains on alternating doses of levothyroxine  112 and 100 mcg.      Prediabetes   A1c has improved to 5.6 and is no longer in the prediabetic  range.  She was congratulated on her progress and encouraged to maintain lifestyle modifications aimed at weight loss.      Hyperlipidemia - Primary   Lipid panel updated last week.  Total cholesterol 187 and LDL 115.  Both values are significantly increased from September 2024.  She is currently prescribed rosuvastatin  5 mg 3 times weekly but acknowledges intermittent compliance and poor dieting habits.  She plans to gradually increase the frequency of rosuvastatin  and focus on dietary changes in an effort to improve her cholesterol.  Rosuvastatin  refilled today.      Caregiver stress   Katie Davis is understandably emotional when discussing her husband's health and recognizes that it is starting to take a toll on her health as well.  She has previously seen a counselor and felt that it was beneficial but is not currently followed by counseling.  I offered to place a referral today but she has declined for now.  She will follow-up if interested in counseling services.      Return in about 6 months (around 01/20/2024).   Tobi Fortes, MD

## 2023-07-21 ENCOUNTER — Other Ambulatory Visit: Payer: Self-pay | Admitting: Internal Medicine

## 2023-07-30 ENCOUNTER — Ambulatory Visit (HOSPITAL_COMMUNITY)
Admission: RE | Admit: 2023-07-30 | Discharge: 2023-07-30 | Disposition: A | Discharge status: Home / Self Care | Source: Ambulatory Visit | Attending: Cardiology | Admitting: Cardiology

## 2023-07-30 ENCOUNTER — Encounter: Payer: Self-pay | Admitting: Cardiology

## 2023-07-30 DIAGNOSIS — R011 Cardiac murmur, unspecified: Secondary | ICD-10-CM

## 2023-07-30 LAB — ECHOCARDIOGRAM COMPLETE
AR max vel: 1.33 cm2
AV Area VTI: 1.13 cm2
AV Area mean vel: 1.26 cm2
AV Mean grad: 12 mmHg
AV Peak grad: 20.6 mmHg
Ao pk vel: 2.27 m/s
Area-P 1/2: 4.26 cm2
Calc EF: 61.4 %
S' Lateral: 2.5 cm
Single Plane A2C EF: 52.3 %
Single Plane A4C EF: 64.5 %

## 2023-08-04 DIAGNOSIS — D485 Neoplasm of uncertain behavior of skin: Secondary | ICD-10-CM | POA: Diagnosis not present

## 2023-08-04 DIAGNOSIS — D1801 Hemangioma of skin and subcutaneous tissue: Secondary | ICD-10-CM | POA: Diagnosis not present

## 2023-08-04 DIAGNOSIS — L988 Other specified disorders of the skin and subcutaneous tissue: Secondary | ICD-10-CM | POA: Diagnosis not present

## 2023-08-11 ENCOUNTER — Telehealth: Payer: Self-pay | Admitting: Internal Medicine

## 2023-08-11 NOTE — Telephone Encounter (Signed)
 Copied from CRM [id number]. Topic: General - Other >> Aug 11, 2023  2:19 PM Tiffany S wrote: Reason for CRM: Patient needs allergy list faxed to office [phone]  Attn: Ennis Hart

## 2023-08-11 NOTE — Telephone Encounter (Signed)
 Faxed

## 2023-08-17 ENCOUNTER — Ambulatory Visit: Admitting: Internal Medicine

## 2023-08-17 ENCOUNTER — Encounter: Payer: Self-pay | Admitting: Internal Medicine

## 2023-08-17 ENCOUNTER — Telehealth: Payer: Self-pay | Admitting: Cardiology

## 2023-08-17 VITALS — BP 118/70 | HR 67 | Ht 63.0 in | Wt 171.2 lb

## 2023-08-17 DIAGNOSIS — R0609 Other forms of dyspnea: Secondary | ICD-10-CM | POA: Diagnosis not present

## 2023-08-17 MED ORDER — ALBUTEROL SULFATE HFA 108 (90 BASE) MCG/ACT IN AERS: INHALATION_SPRAY | RESPIRATORY_TRACT | 1 refills | Status: AC

## 2023-08-17 NOTE — Patient Instructions (Addendum)
 My office will be contacting you by phone for referral to allergy in [place]   - if you don't hear back from my office within one week please call us  back or notify us  thru MyChart and we'll address it right away.   Only use your albuterol  as a rescue medication to be used if you can't catch your breath by resting or doing a relaxed purse lip breathing pattern.  - The less you use it, the better it will work when you need it. - Ok to use up to 1 puffs  every 4 hours if you must but call for immediate appointment if use goes up over your usual need - Don't leave home without it !!  (think of it like the spare tire for your car)   Also  Ok to try albuterol  15 min before an activity (on alternating days)  that you know would usually make you short of breath and see if it makes any difference and if makes none then don't take albuterol  after activity unless you can't catch your breath as this means it's the resting that helps, not the albuterol .   Make sure you check your oxygen saturation at your highest level of activity (NOT after you stop)  to be sure it stays over 90% and keep track of it at least once a week, more often if breathing getting worse, and let me know if losing ground. (Collect the dots to connect the dots approach)        Pulmonary follow up is as needed

## 2023-08-17 NOTE — Progress Notes (Unsigned)
 Katie Davis, female    DOB: 1955/04/28    MRN: [id number]   Brief patient profile:  42   yowf from [facility]  never smoker  former Sood Pt self-referred back to pulmonary clinic in [place]  08/17/2023  for Covid 2022 x 4 >  "full blown asthma" on top of season rhinitis with bronchiectasis on CT    Pulmonary testing: wnl   PFT 02/27/21 >> FEV1 1.95 (82%), FEV1% 83, TLC 4.75 (96%), DLCO 68%   Chest Imaging:  HRCT chest 03/05/21 >> enlarged PA, atherosclerosis, basilar and peripheral GGO with bronchiectasis, calcified granuloma LLL, air trapping   Cardiac testing:  Echo 03/07/21 >> EF 55 to 60%, mild LVH Echo 08/09/23  Mild lvh  mild AR, Mild to mod AS with Nl LA   History of Present Illness  08/17/2023  Pulmonary/ 1st office eval/ Tyeasha Ebbs / [facility]  BREO 100 (alternates with 200) Chief Complaint  Patient presents with   Asthma  Dyspnea:  was working out at gym and now starting back eliptical part of work out up to an  hour toll well  Steps fine  Cough: cough each am but mostly  mucoid assoc with nasal congestion and "drainage" but nothing purulent  Sleep: flat bed one pillow s resp cc SABA use: not using  02 WUJ:WJXB  - sats are mid 90's even doing aerobics    No obvious day to day or daytime pattern/variability or assoc   purulent sputum or mucus plugs or hemoptysis or cp or chest tightness, subjective wheeze or overt sinus or hb symptoms.    Also denies any obvious fluctuation of symptoms with weather or environmental changes or other aggravating or alleviating factors except as outlined above   No unusual exposure hx or h/o childhood pna/ asthma or knowledge of premature birth.  Current Allergies, Complete Past Medical History, Past Surgical History, Family History, and Social History were reviewed in [employer] record.  ROS  The following are not active complaints unless bolded Hoarseness, sore throat, dysphagia, dental problems, itching,  sneezing,  nasal congestion or discharge of excess mucus or purulent secretions, ear ache,   fever, chills, sweats, unintended wt loss or wt gain, classically pleuritic or exertional cp,  orthopnea pnd or arm/hand swelling  or leg swelling, presyncope, palpitations, abdominal pain, anorexia, nausea, vomiting, diarrhea  or change in bowel habits or change in bladder habits, change in stools or change in urine, dysuria, hematuria,  rash, arthralgias, visual complaints, headache, numbness, weakness or ataxia or problems with walking or coordination,  change in mood or  memory.            Outpatient Medications Prior to Visit  Medication Sig Dispense Refill   acidophilus (RISAQUAD) CAPS capsule Take 1 capsule by mouth daily.     albuterol  (VENTOLIN  HFA) 108 (90 Base) MCG/ACT inhaler Inhale 1-2 puffs into the lungs every 6 (six) hours as needed for wheezing or shortness of breath.     aspirin  EC 81 MG tablet Take 1 tablet (81 mg total) by mouth daily. Swallow whole. 90 tablet 3   atenolol  (TENORMIN ) 50 MG tablet Take 1 tablet (50 mg total) by mouth 2 (two) times daily. 180 tablet 3   BREO ELLIPTA  100-25 MCG/ACT AEPB INHALE 1 PUFF BY MOUTH ONCE DAILY. ALTERNATES WHEN NEEDED 60 each 0   CALCIUM  CARBONATE ANTACID PO Take 2 tablets by mouth daily.     Cholecalciferol (VITAMIN D3 GUMMIES PO) Take 6,000 Units by  mouth daily. 2000 units     clonazePAM  (KLONOPIN ) 0.5 MG tablet Take 1 tablet (0.5 mg total) by mouth 2 (two) times daily as needed for anxiety. 60 tablet 0   ELDERBERRY PO Take 200 mg by mouth daily. 100 mg     fluticasone  (FLONASE ) 50 MCG/ACT nasal spray Place 1 spray into both nostrils daily as needed (Sinus). 9.9 mL 0   fluticasone  furoate-vilanterol (BREO ELLIPTA ) 200-25 MCG/ACT AEPB Inhale 1 puff into the lungs daily. 30 each 1   ibuprofen  (ADVIL ) 800 MG tablet Take 1 tablet (800 mg total) by mouth every 8 (eight) hours as needed for moderate pain. 30 tablet 0   ipratropium (ATROVENT ) 0.03 %  nasal spray Place 2 sprays into both nostrils every 12 (twelve) hours. 30 mL 12   linaclotide  (LINZESS ) 145 MCG CAPS capsule Take 1 capsule (145 mcg total) by mouth daily before breakfast. 90 capsule 3   Omega-3 Fatty Acids (FISH OIL) 1000 MG CAPS Take 2,000 mg by mouth daily.     ondansetron  (ZOFRAN ) 4 MG tablet Take 1 tablet (4 mg total) by mouth every 8 (eight) hours as needed. 30 tablet 3   pantoprazole  (PROTONIX ) 40 MG tablet TAKE 1 TABLET BY MOUTH ONCE DAILY BEFORE BREAKFAST 90 tablet 1   Perfluorohexyloctane  (MIEBO ) 1.338 GM/ML SOLN Apply 1 Application to eye as needed. 3 mL 0   Polyethyl Glyc-Propyl Glyc PF (SYSTANE HYDRATION PF) 0.4-0.3 % SOLN Place 1 drop into both eyes daily as needed (Dry eye).     rosuvastatin  (CRESTOR ) 5 MG tablet Take 1 tablet (5 mg total) by mouth every Monday, Wednesday, and Friday. 90 tablet 1   SPIKEVAX syringe Inject 0.5 mLs into the muscle once.     SYNTHROID  112 MCG tablet Take 1 tablet (112 mcg total) by mouth See admin instructions. Alternates every other day with 100 mcg - Brand Name 90 tablet 1   No facility-administered medications prior to visit.    Past Medical History:  Diagnosis Date   Abnormal glandular Papanicolaou smear of cervix    Allergy 2008   Latex ..after surgery   Anxiety    Asthma    Body mass index 30.0-30.9, adult    Celiac disease    Chronic vascular disorder of intestine (HCC)    Encounter for general adult medical examination with abnormal findings 05/07/2020   Fibromyalgia    GERD (gastroesophageal reflux disease) 14 years ago   Graves disease    Headache    Migrains   Hyperlipidemia    Hypothyroidism    IBS (irritable bowel syndrome) With constipation    ILD (interstitial lung disease) (HCC)    Lupus (systemic lupus erythematosus) (HCC)    Obesity, unspecified    Osteoporosis    Raynaud disease    Sjogren's disease (HCC)    Type 2 diabetes mellitus without complications (HCC) 02/25/2022      Objective:      BP 118/70 (BP Location: Left Arm)   Pulse 67   Ht 5\' 3"  (1.6 m)   Wt 171 lb 3.2 oz (77.7 kg)   SpO2 98% Comment: RA  BMI 30.33 kg/m   SpO2: 98 % (RA)  amb pleasantly anxious wf nad    HEENT : Oropharynx  clear      Nasal turbinates mild/mod non-specific edema s polyps or pallor or secretions    NECK :  without  apparent JVD/ palpable Nodes/TM    LUNGS: no acc muscle use,  Nl contour chest which is  clear to A and P bilaterally without cough on insp or exp maneuvers   CV:  RRR  no s3 or 2/6 SEM s  increase in P2, and no edema   ABD:  soft and nontender   MS:  Gait nl   ext warm without deformities Or obvious joint restrictions  calf tenderness, cyanosis or clubbing    SKIN: warm and dry without lesions    NEURO:  alert, approp, nl sensorium with  no motor or cerebellar deficits apparent.       Assessment   No problem-specific Assessment & Plan notes found for this encounter.     Vernestine Gondola, MD 08/17/2023

## 2023-08-17 NOTE — Telephone Encounter (Signed)
 Patient stated she sent a MyChart message on 6/19. She hadn't received a call back or message back. She had a few concerns with her breathing. Can someone please reach out to her.

## 2023-08-18 ENCOUNTER — Encounter: Payer: Self-pay | Admitting: Internal Medicine

## 2023-08-18 DIAGNOSIS — E039 Hypothyroidism, unspecified: Secondary | ICD-10-CM

## 2023-08-18 NOTE — Assessment & Plan Note (Signed)
 Onset with covid Nov 10 2020 rx paxlovid then zpak - 12/05/2020   Walked on RA  x  3  lap(s) =  approx 450 @ fast pace, stopped due to end of study mod sob  with lowest 02 sats 96%  PFT 02/27/21 >> FEV1 1.95 (82%), FEV1% 83, TLC 4.75 (96%), DLCO 68%  No longer limited by doe and most concerned about pnds/ cough so rec continue BREO 200 a/w 100 each am and refer to allergy for the rhinitis issues   Pulmlonary  f/u can be prn          Each maintenance medication was reviewed in detail including emphasizing most importantly the difference between maintenance and prns and under what circumstances the prns are to be triggered using an action plan format where appropriate.  Total time for H and P, chart review, counseling, reviewing dpi/ hfa/ neb device(s) and generating customized AVS unique to this office visit / same day charting = 30 min with pt not seen in  2.5 years

## 2023-08-19 ENCOUNTER — Ambulatory Visit: Payer: Self-pay | Admitting: Cardiology

## 2023-08-19 DIAGNOSIS — E039 Hypothyroidism, unspecified: Secondary | ICD-10-CM | POA: Diagnosis not present

## 2023-08-21 ENCOUNTER — Other Ambulatory Visit: Payer: Self-pay | Admitting: Internal Medicine

## 2023-08-23 ENCOUNTER — Telehealth: Payer: Self-pay | Admitting: Orthopedic Surgery

## 2023-08-23 NOTE — Telephone Encounter (Signed)
 Dr. Ernesta Heading pt - she lvm for Leta to call her that she needs to speak to her.  I called her back to let her know that Adella Honey is not here today and I asked her what she needed so I could relay that to her tomorrow, she said that's ok, just have her call me tomorrow.  [phone]

## 2023-08-24 LAB — TSH+T4F+T3FREE
Free T4: 1.36 ng/dL (ref 0.82–1.77)
T3, Free: 2.5 pg/mL (ref 2.0–4.4)
TSH: 4.56 u[IU]/mL — ABNORMAL HIGH (ref 0.450–4.500)

## 2023-08-24 NOTE — Telephone Encounter (Signed)
 LVM to call back.

## 2023-08-24 NOTE — Telephone Encounter (Signed)
 Dr. Ernesta Heading pt - pt lvm stating she was returning Leta's call, she really wants to speak w/her today.  [phone]

## 2023-08-24 NOTE — Telephone Encounter (Signed)
 Spoke w/ pt about concerns in her husbands chart. Answered questions that I could about notes in system.

## 2023-08-25 NOTE — Addendum Note (Signed)
 Addended by: Dalten Ambrosino E on: 08/25/2023 12:42 PM   Modules accepted: Orders

## 2023-08-26 ENCOUNTER — Ambulatory Visit: Admitting: Obstetrics & Gynecology

## 2023-08-30 ENCOUNTER — Other Ambulatory Visit: Payer: Self-pay | Admitting: Internal Medicine

## 2023-09-15 ENCOUNTER — Other Ambulatory Visit: Payer: Self-pay | Admitting: Gastroenterology

## 2023-09-15 ENCOUNTER — Ambulatory Visit

## 2023-09-15 ENCOUNTER — Ambulatory Visit: Payer: Self-pay

## 2023-09-15 VITALS — BP 115/76 | HR 69 | Ht 63.0 in | Wt 159.1 lb

## 2023-09-15 DIAGNOSIS — T50Z95A Adverse effect of other vaccines and biological substances, initial encounter: Secondary | ICD-10-CM

## 2023-09-15 DIAGNOSIS — K219 Gastro-esophageal reflux disease without esophagitis: Secondary | ICD-10-CM

## 2023-09-15 DIAGNOSIS — R52 Pain, unspecified: Secondary | ICD-10-CM

## 2023-09-15 DIAGNOSIS — R1084 Generalized abdominal pain: Secondary | ICD-10-CM

## 2023-09-15 DIAGNOSIS — R110 Nausea: Secondary | ICD-10-CM

## 2023-09-15 NOTE — Telephone Encounter (Signed)
 Reason for Disposition . [1] Fever > 100.0 F (37.8 C) AND [2] bedridden (e.g., CVA, chronic illness, recovering from surgery)  Answer Assessment - Initial Assessment Questions 1. SYMPTOMS: What is the main symptom? (e.g., redness, swelling, pain)      Tiredness  2. ONSET: When was the vaccine (shot) given? How much later did the  begin? (e.g., hours, days ago)      Last Thursday- shingles and Tetanus  3. SEVERITY: How bad is it?      Whole body hurts 4. FEVER: Is there a fever? If Yes, ask: What is it, how was it measured, and when did it start?      Yesterday fever but not sure of reading 5. IMMUNIZATIONS GIVEN: What shots have you recently received?      shingles and Tetanus  6. PAST REACTIONS: Have you reacted to immunizations before? If Yes, ask: What happened?     Yes -same symptoms  7. OTHER SYMPTOMS: Do you have any other symptoms?     Nausea, headache, body aches, body shaky, heart raced.    Pt has autosomal disorders and feels like vaccines has made it worse. Pt feels like she has the flu. Pt consulted pharmacist and was told you have all the side effects. I can take over a week for symptoms to subside. Pt has been taking OTC pain medication and drinking green tea.  Protocols used: Immunization Reactions-A-AH

## 2023-09-15 NOTE — Telephone Encounter (Signed)
 FYI Only or Action Required?: Action required by provider: request for appointment.  Patient was last seen in primary care on 07/20/2023 by Melvenia Manus BRAVO, MD. Called Nurse Triage reporting Immunizations and Fatigue. Symptoms began a week ago. Interventions attempted: OTC medications: green tea/rest/relaxing. Symptoms are: unchanged.  Triage Disposition: See HCP Within 4 Hours (Or PCP Triage)  Patient/caregiver understands and will follow disposition?: Yes

## 2023-09-15 NOTE — Progress Notes (Signed)
 Established Patient Office Visit  Subjective   Patient ID: Katie Davis, female    DOB: [birth year]  Age: 68 y.o. MRN: [id number]  Chief Complaint  Patient presents with   Medical Management of Chronic Issues    Pt states got shingle shot last week and belives its symptoms of that states fever and body aches     HPI  Patient Active Problem List   Diagnosis Date Noted   Burn of abdomen 09/10/2022   Caregiver stress 04/21/2022   Prediabetes 02/25/2022   Hyperlipidemia 02/25/2022   Hypothyroidism 02/25/2022   IBS (irritable bowel syndrome) 02/25/2022   History of Raynaud's syndrome 02/25/2022   Severe persistent asthma 02/25/2022   History of Sjogren's disease (HCC) 02/25/2022   History of systemic lupus erythematosus (SLE) (HCC) 02/25/2022   Osteoporosis 02/25/2022   Gastroesophageal reflux disease 01/30/2021   Early satiety 01/30/2021   Bloating 01/30/2021   Celiac disease    Superior mesenteric artery stenosis (HCC)    DOE (dyspnea on exertion) 12/05/2020   Sjogren's syndrome (HCC) 11/03/2020   Alternating constipation and diarrhea 05/07/2020   Encounter for general adult medical examination with abnormal findings 05/07/2020   Nausea without vomiting 05/07/2020   Generalized abdominal pain 10/24/2019   Postmenopausal 12/14/2018   Vasomotor symptoms due to menopause 12/14/2018   History of abnormal cervical Pap smear 12/14/2018     ROS    Objective:     BP 115/76   Pulse 69   Ht 5' 3 (1.6 m)   Wt 159 lb 1.9 oz (72.2 kg)   SpO2 95%   BMI 28.19 kg/m  BP Readings from Last 3 Encounters:  09/15/23 115/76  08/17/23 118/70  07/20/23 123/77   Wt Readings from Last 3 Encounters:  09/15/23 159 lb 1.9 oz (72.2 kg)  08/17/23 171 lb 3.2 oz (77.7 kg)  07/20/23 170 lb 12.8 oz (77.5 kg)      Physical Exam Vitals and nursing note reviewed.  Constitutional:      Appearance: Normal appearance.  HENT:     Head: Normocephalic.     Right Ear: Tympanic membrane,  ear canal and external ear normal.     Left Ear: Tympanic membrane, ear canal and external ear normal.     Nose: Nose normal.     Mouth/Throat:     Mouth: Mucous membranes are moist.     Pharynx: Oropharynx is clear.  Eyes:     Extraocular Movements: Extraocular movements intact.     Pupils: Pupils are equal, round, and reactive to light.  Cardiovascular:     Rate and Rhythm: Normal rate and regular rhythm.  Pulmonary:     Effort: Pulmonary effort is normal.     Breath sounds: Normal breath sounds.  Musculoskeletal:     Cervical back: Normal range of motion and neck supple.  Neurological:     Mental Status: She is alert and oriented to person, place, and time.  Psychiatric:        Mood and Affect: Mood normal.        Thought Content: Thought content normal.      No results found for any visits on 09/15/23.    The 10-year ASCVD risk score (Arnett DK, et al., 2019) is: 7.9%    Assessment & Plan:   Problem List Items Addressed This Visit   None Visit Diagnoses       Body aches after vaccination    -  Primary   unremarkable exam.  reassured patient  that symptoms should improve over the next few days.  recommend increased fluids and rest.       No follow-ups on file.    Leita Longs, FNP

## 2023-09-18 ENCOUNTER — Other Ambulatory Visit: Payer: Self-pay | Admitting: Internal Medicine

## 2023-09-21 ENCOUNTER — Telehealth: Payer: Self-pay

## 2023-09-21 ENCOUNTER — Other Ambulatory Visit: Payer: Self-pay

## 2023-09-21 MED ORDER — CYCLOBENZAPRINE HCL 10 MG PO TABS: 10.0000 mg | ORAL_TABLET | Freq: Three times a day (TID) | ORAL | 2 refills | Status: DC | PRN

## 2023-09-21 NOTE — Telephone Encounter (Unsigned)
 Copied from CRM [id number]. Topic: Clinical - Medication Refill >> Sep 21, 2023  9:52 AM Winona R wrote: Medication: cyclobenzaprine  (FLEXERIL ) 10 MG tablet [[id number]] DISCONTINUED  Has the patient contacted their pharmacy? Yes, medication has been discontinued in the past per her preference however she does believe she need it now. Pt stated she does not take the medication often but when needed.  This is the patient's preferred pharmacy:  [facility] [address], [place] - [address] [place] [address] [place] [address] Phone: [phone] Fax: [phone]  Is this the correct pharmacy for this prescription? Yes If no, delete pharmacy and type the correct one.   Has the prescription been filled recently? No  Is the patient out of the medication? Yes  Has the patient been seen for an appointment in the last year OR does the patient have an upcoming appointment? Yes  Can we respond through MyChart? Yes  Agent: Please be advised that Rx refills may take up to 3 business days. We ask that you follow-up with your pharmacy.

## 2023-09-29 ENCOUNTER — Telehealth: Payer: Self-pay | Admitting: *Deleted

## 2023-09-29 MED ORDER — FLUTICASONE FUROATE-VILANTEROL 100-25 MCG/ACT IN AEPB: 1.0000 | INHALATION_SPRAY | Freq: Every day | RESPIRATORY_TRACT | 2 refills | Status: DC

## 2023-09-29 MED ORDER — FLUTICASONE FUROATE-VILANTEROL 200-25 MCG/ACT IN AEPB: 1.0000 | INHALATION_SPRAY | Freq: Every day | RESPIRATORY_TRACT | 2 refills | Status: DC

## 2023-09-29 NOTE — Telephone Encounter (Signed)
 Katie Ozell NOVAK, MD to Me     09/29/23 12:12 PM Fine to refill until sees allergy  Spoke with the pt and notified ok for breo  I sent rxs to preferred pharm  Nothing further needed

## 2023-09-29 NOTE — Telephone Encounter (Signed)
 Copied from CRM [id number]. Topic: Clinical - Prescription Issue >> Sep 28, 2023 10:11 AM Benton KIDD wrote: Reason for CRM: patient is calling to see or get clarification on how to go about getting her breo medication now that dr wert has referred patient to a allergy and asthma dr . Please reach out to patient for better clarification on how to go about getting a refill for breo medication . Did give patient info about her referral also cause she said no one called her and told her anything  [phone]   I called and spoke with the pt  She states has not been set up with [facility] yet, and is needing her breo refilled  It was not mentioned lov  She is requesting the 100 and 200 mcg doses- takes the higher dose when she is flaring   Dr Darlean- please advise on Breo rxs, thanks!

## 2023-10-03 ENCOUNTER — Ambulatory Visit
Admission: EM | Admit: 2023-10-03 | Discharge: 2023-10-03 | Disposition: A | Discharge status: Home / Self Care | Attending: Nurse Practitioner | Admitting: Nurse Practitioner

## 2023-10-03 DIAGNOSIS — J0140 Acute pansinusitis, unspecified: Secondary | ICD-10-CM | POA: Diagnosis not present

## 2023-10-03 LAB — POC COVID19/FLU A&B COMBO
Covid Antigen, POC: NEGATIVE
Influenza A Antigen, POC: NEGATIVE
Influenza B Antigen, POC: NEGATIVE

## 2023-10-03 MED ORDER — AZITHROMYCIN 250 MG PO TABS: 250.0000 mg | ORAL_TABLET | Freq: Every day | ORAL | 0 refills | Status: DC

## 2023-10-03 NOTE — Discharge Instructions (Addendum)
 The COVID/flu test was negative. Take medication as directed. Continue your current allergy medication regimen. Recommend over-the-counter guaifenesin to help with your cough. Increase fluids and get plenty of rest. Continue over-the-counter Tylenol  as needed for pain, fever, or general discomfort. Recommend normal saline nasal spray to help with nasal congestion throughout the day. For your cough, it may be helpful to use a humidifier at bedtime during sleep. If your symptoms fail to improve with this treatment, you may follow-up in this clinic or with your primary care physician for further evaluation. Follow-up as needed.

## 2023-10-03 NOTE — ED Triage Notes (Signed)
 Pt reports she has nasal congestion, facial swelling, cough, and phlegm in chest x4 days

## 2023-10-03 NOTE — ED Provider Notes (Signed)
 [facility]    CSN: [id number] Arrival date & time: 10/03/23  9170      History   Chief Complaint No chief complaint on file.   HPI Katie Davis is a 68 y.o. female.   The history is provided by the patient.   Patient presents with a several day history of headache, sore throat, nasal congestion, facial pain, facial swelling, cough, and chest congestion.  Patient reports she has had a low-grade temperature at bedtime around 99.  She states that she has been wheezing, but has been using her inhalers as previously prescribed.  Denies ear drainage, difficulty breathing, chest pain, abdominal pain, nausea, vomiting, diarrhea, or rash.  States she has been using over-the-counter Tylenol , Flonase , and natural remedies at home with minimal relief of her symptoms.  Patient reports underlying history of asthma.  Past Medical History:  Diagnosis Date   Abnormal glandular Papanicolaou smear of cervix    Allergy 2008   Latex ..after surgery   Anxiety    Asthma    Body mass index 30.0-30.9, adult    Celiac disease    Chronic vascular disorder of intestine (HCC)    Encounter for general adult medical examination with abnormal findings 05/07/2020   Fibromyalgia    GERD (gastroesophageal reflux disease) 14 years ago   Graves disease    Headache    Migrains   Hyperlipidemia    Hypothyroidism    IBS (irritable bowel syndrome) With constipation    ILD (interstitial lung disease) (HCC)    Lupus (systemic lupus erythematosus) (HCC)    Obesity, unspecified    Osteoporosis    Raynaud disease    Sjogren's disease (HCC)    Type 2 diabetes mellitus without complications (HCC) 02/25/2022    Patient Active Problem List   Diagnosis Date Noted   Burn of abdomen 09/10/2022   Caregiver stress 04/21/2022   Prediabetes 02/25/2022   Hyperlipidemia 02/25/2022   Hypothyroidism 02/25/2022   IBS (irritable bowel syndrome) 02/25/2022   History of Raynaud's syndrome 02/25/2022   Severe  persistent asthma 02/25/2022   History of Sjogren's disease (HCC) 02/25/2022   History of systemic lupus erythematosus (SLE) (HCC) 02/25/2022   Osteoporosis 02/25/2022   Gastroesophageal reflux disease 01/30/2021   Early satiety 01/30/2021   Bloating 01/30/2021   Celiac disease    Superior mesenteric artery stenosis (HCC)    DOE (dyspnea on exertion) 12/05/2020   Sjogren's syndrome (HCC) 11/03/2020   Alternating constipation and diarrhea 05/07/2020   Encounter for general adult medical examination with abnormal findings 05/07/2020   Nausea without vomiting 05/07/2020   Generalized abdominal pain 10/24/2019   Postmenopausal 12/14/2018   Vasomotor symptoms due to menopause 12/14/2018   History of abnormal cervical Pap smear 12/14/2018    Past Surgical History:  Procedure Laterality Date   APPENDECTOMY     CHOLECYSTECTOMY     COLONOSCOPY WITH PROPOFOL  N/A 06/10/2020   Surgeon: Cindie Carlin POUR, DO;  diverticulosis and nonbleeding internal hemorrhoids, due for repeat in 2032.   DG THUMB RIGHT HAND ([facility] HX)     EYE SURGERY  2019   Cataract. Right eye   little finger Right    placement of pin   OTHER SURGICAL HISTORY     biopsies on both feet   REDUCTION MAMMAPLASTY      OB History     Gravida  3   Para      Term      Preterm      AB  3   Living  0      SAB  3   IAB      Ectopic      Multiple      Live Births               Home Medications    Prior to Admission medications   Medication Sig Start Date End Date Taking? Authorizing Provider  azithromycin  (ZITHROMAX ) 250 MG tablet Take 1 tablet (250 mg total) by mouth daily. Take first 2 tablets together, then 1 every day until finished. 10/03/23  Yes Leath-Warren, Etta PARAS, NP  acidophilus (RISAQUAD) CAPS capsule Take 1 capsule by mouth daily.    [provider]  albuterol  (PROAIR  HFA) 108 (90 Base) MCG/ACT inhaler 1-2 puffs every 4 hours as needed only  if your can't catch your breath  08/17/23   Darlean Ozell NOVAK, MD  aspirin  EC 81 MG tablet Take 1 tablet (81 mg total) by mouth daily. Swallow whole. 04/04/21   Alvan Dorn FALCON, MD  atenolol  (TENORMIN ) 50 MG tablet Take 1 tablet (50 mg total) by mouth 2 (two) times daily. 03/03/23   Melvenia Manus BRAVO, MD  BREO ELLIPTA  100-25 MCG/ACT AEPB Inhale 1 puff by mouth once daily 08/23/23   Dixon, Phillip E, MD  CALCIUM  CARBONATE ANTACID PO Take 2 tablets by mouth daily.    [provider]  Cholecalciferol (VITAMIN D3 GUMMIES PO) Take 6,000 Units by mouth daily. 2000 units    [provider]  clonazePAM  (KLONOPIN ) 0.5 MG tablet Take 1 tablet by mouth twice daily as needed for anxiety 08/30/23   Melvenia Manus BRAVO, MD  cyclobenzaprine  (FLEXERIL ) 10 MG tablet Take 1 tablet (10 mg total) by mouth 3 (three) times daily as needed for muscle spasms. 09/21/23   Bevely Doffing, FNP  ELDERBERRY PO Take 200 mg by mouth daily. 100 mg    [provider]  fluticasone  (FLONASE ) 50 MCG/ACT nasal spray Place 1 spray into both nostrils daily as needed (Sinus). 06/02/23   Melvenia Manus BRAVO, MD  fluticasone  furoate-vilanterol (BREO ELLIPTA ) 100-25 MCG/ACT AEPB Inhale 1 puff into the lungs daily. 09/29/23   Darlean Ozell NOVAK, MD  fluticasone  furoate-vilanterol (BREO ELLIPTA ) 200-25 MCG/ACT AEPB Inhale 1 puff into the lungs daily. 04/02/23   Melvenia Manus BRAVO, MD  fluticasone  furoate-vilanterol (BREO ELLIPTA ) 200-25 MCG/ACT AEPB Inhale 1 puff into the lungs daily. 09/29/23   Darlean Ozell NOVAK, MD  ibuprofen  (ADVIL ) 800 MG tablet TAKE 1 TABLET BY MOUTH EVERY 8 HOURS AS NEEDED FOR MODERATE PAIN 09/20/23   Bevely Doffing, FNP  ipratropium (ATROVENT ) 0.03 % nasal spray Place 2 sprays into both nostrils every 12 (twelve) hours. 04/15/23   Del Wilhelmena Lloyd Sola, FNP  linaclotide  (LINZESS ) 145 MCG CAPS capsule Take 1 capsule (145 mcg total) by mouth daily before breakfast. 03/25/23 03/24/24  Melvenia Manus BRAVO, MD  Omega-3 Fatty Acids (FISH OIL) 1000 MG CAPS Take  2,000 mg by mouth daily.    [provider]  ondansetron  (ZOFRAN ) 4 MG tablet Take 1 tablet (4 mg total) by mouth every 8 (eight) hours as needed. 04/12/23   Melvenia Manus BRAVO, MD  pantoprazole  (PROTONIX ) 40 MG tablet TAKE 1 TABLET BY MOUTH ONCE DAILY BEFORE BREAKFAST 09/16/23   Harper, Kristen S, PA-C  Perfluorohexyloctane  (MIEBO ) 1.338 GM/ML SOLN Apply 1 Application to eye as needed. 07/19/23   Melvenia Manus BRAVO, MD  Polyethyl Glyc-Propyl Glyc PF (SYSTANE HYDRATION PF) 0.4-0.3 % SOLN Place 1 drop into both  eyes daily as needed (Dry eye).    [provider]  rosuvastatin  (CRESTOR ) 5 MG tablet Take 1 tablet (5 mg total) by mouth every Monday, Wednesday, and Friday. 07/21/23   Dixon, Phillip E, MD  SPIKEVAX syringe Inject 0.5 mLs into the muscle once. 12/30/22   [provider]  SYNTHROID  112 MCG tablet Take 1 tablet (112 mcg total) by mouth See admin instructions. Alternates every other day with 100 mcg - Brand Name 06/25/23 12/22/23  Melvenia Manus BRAVO, MD    Family History Family History  Problem Relation Age of Onset   Congestive Heart Failure Mother    Arthritis Mother    Aneurysm Father    Celiac disease Brother    Melanoma Brother    Aneurysm Brother        stomach   Asthma Brother    Cancer Brother    Stroke Sister    Celiac disease Sister    Irritable bowel syndrome Sister    Cancer Sister    Celiac disease Brother    Aneurysm Sister    Other Sister        multiple back surgeries   Colon cancer Neg Hx     Social History Social History   Tobacco Use   Smoking status: Never    Passive exposure: Never   Smokeless tobacco: Never  Vaping Use   Vaping status: Never Used  Substance Use Topics   Alcohol use: Not Currently    Comment: wine occ   Drug use: Never     Allergies   Prednisone, Bactrim [sulfamethoxazole-trimethoprim], Codeine, Denosumab , Gluten meal, Influenza vaccines, Keflex [cephalexin], Levaquin [levofloxacin], Sulfa antibiotics, Contrast  media [iodinated contrast media], Doxycycline, and Latex   Review of Systems Review of Systems Per HPI  Physical Exam Triage Vital Signs ED Triage Vitals  Encounter Vitals Group     BP 10/03/23 0838 131/76     Girls Systolic BP Percentile --      Girls Diastolic BP Percentile --      Boys Systolic BP Percentile --      Boys Diastolic BP Percentile --      Pulse Rate 10/03/23 0838 75     Resp 10/03/23 0838 20     Temp 10/03/23 0838 98.2 F (36.8 C)     Temp Source 10/03/23 0838 Oral     SpO2 10/03/23 0838 96 %     Weight --      Height --      Head Circumference --      Peak Flow --      Pain Score 10/03/23 0840 0     Pain Loc --      Pain Education --      Exclude from Growth Chart --    No data found.  Updated Vital Signs BP 131/76 (BP Location: Right Arm)   Pulse 75   Temp 98.2 F (36.8 C) (Oral)   Resp 20   SpO2 96%   Visual Acuity Right Eye Distance:   Left Eye Distance:   Bilateral Distance:    Right Eye Near:   Left Eye Near:    Bilateral Near:     Physical Exam Vitals and nursing note reviewed.  Constitutional:      General: She is not in acute distress.    Appearance: Normal appearance.  HENT:     Head: Normocephalic.     Right Ear: Tympanic membrane, ear canal and external ear normal.     Left  Ear: Tympanic membrane, ear canal and external ear normal.     Nose: Congestion present.     Right Turbinates: Enlarged and swollen.     Left Turbinates: Enlarged and swollen.     Right Sinus: No maxillary sinus tenderness or frontal sinus tenderness.     Left Sinus: Maxillary sinus tenderness and frontal sinus tenderness present.     Mouth/Throat:     Lips: Pink.     Mouth: Mucous membranes are moist.     Pharynx: Posterior oropharyngeal erythema and postnasal drip present. No pharyngeal swelling, oropharyngeal exudate or uvula swelling.     Comments: Cobblestoning present to posterior oropharynx  Eyes:     Extraocular Movements: Extraocular  movements intact.     Conjunctiva/sclera: Conjunctivae normal.     Pupils: Pupils are equal, round, and reactive to light.  Cardiovascular:     Rate and Rhythm: Normal rate and regular rhythm.     Pulses: Normal pulses.     Heart sounds: Normal heart sounds.  Pulmonary:     Effort: Pulmonary effort is normal. No respiratory distress.     Breath sounds: Normal breath sounds. No stridor. No wheezing, rhonchi or rales.  Abdominal:     General: Bowel sounds are normal.     Palpations: Abdomen is soft.     Tenderness: There is no abdominal tenderness.  Musculoskeletal:     Cervical back: Normal range of motion.  Skin:    General: Skin is warm and dry.  Neurological:     General: No focal deficit present.     Mental Status: She is alert and oriented to person, place, and time.  Psychiatric:        Mood and Affect: Mood normal.        Behavior: Behavior normal.      UC Treatments / Results  Labs (all labs ordered are listed, but only abnormal results are displayed) Labs Reviewed  POC COVID19/FLU A&B COMBO - Normal    EKG   Radiology No results found.  Procedures Procedures (including critical care time)  Medications Ordered in UC Medications - No data to display  Initial Impression / Assessment and Plan / UC Course  I have reviewed the triage vital signs and the nursing notes.  Pertinent labs & imaging results that were available during my care of the patient were reviewed by me and considered in my medical decision making (see chart for details).  COVID/flu test was negative.  Patient with several day history of sinus pressure, low-grade temp, and fatigue.  Will treat for acute pansinusitis with azithromycin  250 mg.  Supportive care recommendations were provided and discussed with the patient to include fluids, rest, over-the-counter analgesics, and use of normal saline nasal spray.  Discussed indications with patient regarding follow-up.  Patient was in agreement with  this plan of care and verbalizes understanding.  All questions were answered.  Patient stable for discharge.  Final Clinical Impressions(s) / UC Diagnoses   Final diagnoses:  Acute maxillary sinusitis, recurrence not specified   Discharge Instructions   None    ED Prescriptions     Medication Sig Dispense Auth. Provider   azithromycin  (ZITHROMAX ) 250 MG tablet Take 1 tablet (250 mg total) by mouth daily. Take first 2 tablets together, then 1 every day until finished. 6 tablet Leath-Warren, Etta PARAS, NP      PDMP not reviewed this encounter.   Gilmer Etta PARAS, NP 10/03/23 [phone]

## 2023-10-04 ENCOUNTER — Other Ambulatory Visit: Payer: Self-pay | Admitting: Gastroenterology

## 2023-10-04 DIAGNOSIS — K581 Irritable bowel syndrome with constipation: Secondary | ICD-10-CM

## 2023-10-04 MED ORDER — LINACLOTIDE 290 MCG PO CAPS
290.0000 ug | ORAL_CAPSULE | Freq: Every day | ORAL | 5 refills | Status: AC
Start: 2023-10-04 — End: ?

## 2023-10-07 ENCOUNTER — Encounter: Payer: Self-pay | Admitting: Obstetrics & Gynecology

## 2023-10-07 ENCOUNTER — Other Ambulatory Visit (HOSPITAL_COMMUNITY)
Admission: RE | Admit: 2023-10-07 | Discharge: 2023-10-07 | Disposition: A | Discharge status: Home / Self Care | Source: Ambulatory Visit | Attending: Obstetrics & Gynecology | Admitting: Obstetrics & Gynecology

## 2023-10-07 ENCOUNTER — Ambulatory Visit (INDEPENDENT_AMBULATORY_CARE_PROVIDER_SITE_OTHER): Admitting: Obstetrics & Gynecology

## 2023-10-07 VITALS — BP 123/72 | HR 69 | Ht 63.0 in | Wt 168.4 lb

## 2023-10-07 DIAGNOSIS — Z01419 Encounter for gynecological examination (general) (routine) without abnormal findings: Secondary | ICD-10-CM

## 2023-10-07 DIAGNOSIS — Z124 Encounter for screening for malignant neoplasm of cervix: Secondary | ICD-10-CM | POA: Diagnosis not present

## 2023-10-07 DIAGNOSIS — Z1151 Encounter for screening for human papillomavirus (HPV): Secondary | ICD-10-CM | POA: Insufficient documentation

## 2023-10-07 DIAGNOSIS — R300 Dysuria: Secondary | ICD-10-CM

## 2023-10-07 DIAGNOSIS — R3989 Other symptoms and signs involving the genitourinary system: Secondary | ICD-10-CM | POA: Diagnosis not present

## 2023-10-07 LAB — POCT URINALYSIS DIPSTICK
Glucose, UA: NEGATIVE
Ketones, UA: NEGATIVE
Leukocytes, UA: NEGATIVE
Nitrite, UA: NEGATIVE
Protein, UA: NEGATIVE

## 2023-10-07 NOTE — Progress Notes (Signed)
 WELL-WOMAN EXAMINATION Patient name: Katie Davis MRN [id number]  Date of birth: January 06, 1956 Chief Complaint:   Gynecologic Exam  History of Present Illness:   Katie Davis is a 68 y.o. G3P0030 PM female being seen today for a routine well-woman exam.   She denies vaginal bleeding, discharge, itching or irritation.  No acute GYN concerns  She denies concerning pelvic or abdominal pain.  She still notes occasional pain on her left side.  Previous imaging was completed in 2023 that showed no abnormalities.  She states there has been no change in the symptoms  Occasional burning with urination.  +hematuria  No LMP recorded. Patient is postmenopausal.   Last pap 07/2022-pap neg, H+> colpo neg.  Last mammogram: 12/2022. Last colonoscopy: 2022     10/07/2023   11:37 AM 01/19/2023   10:50 AM 09/10/2022    8:56 AM 07/30/2022    9:38 AM 07/20/2022   10:30 AM  Depression screen PHQ 2/9  Decreased Interest 0 0 0 0 1  Down, Depressed, Hopeless 1 0 0 0 1  PHQ - 2 Score 1 0 0 0 2  Altered sleeping 1  0 0 1  Tired, decreased energy 1  1 1 1   Change in appetite 0  0 1 0  Feeling bad or failure about yourself  0  0 0 0  Trouble concentrating 0  0 0 1  Moving slowly or fidgety/restless 0  0 0 0  Suicidal thoughts 0  0 0 0  PHQ-9 Score 3  1 2 5       Review of Systems:   Pertinent items are noted in HPI Denies any headaches, blurred vision, fatigue, shortness of breath, chest pain, abdominal pain, bowel movements, urination, or intercourse unless otherwise stated above.  Pertinent History Reviewed:  Reviewed past medical,surgical, social and family history.  Reviewed problem list, medications and allergies. Physical Assessment:   Vitals:   10/07/23 1139  BP: 123/72  Pulse: 69  Weight: 168 lb 6.4 oz (76.4 kg)  Height: 5' 3 (1.6 m)  Body mass index is 29.83 kg/m.        Physical Examination:   General appearance - well appearing, and in no distress  Mental status - alert,  oriented to person, place, and time  Psych:  She has a normal mood and affect  Skin - warm and dry, normal color, no suspicious lesions noted  Chest - effort normal, all lung fields clear to auscultation bilaterally  Heart - normal rate and regular rhythm  Neck:  midline trachea, no thyromegaly or nodules  Breasts - breasts appear normal, no suspicious masses, no skin or nipple changes or  axillary nodes  Abdomen - soft, nontender, nondistended, no masses or organomegaly  Pelvic - VULVA: normal appearing vulva with no masses, tenderness or lesions  VAGINA: normal appearing vagina with normal color and discharge, no lesions-atrophic changes noted CERVIX: normal appearing cervix without discharge or lesions, no CMT.  Thin prep pap is done with HR HPV cotesting  UTERUS: uterus is felt to be normal size, shape, consistency and nontender   ADNEXA: No adnexal masses or tenderness noted.  Extremities:  No swelling or varicosities noted  Chaperone: Clarita Salt     Assessment & Plan:  1) Well-Woman Exam -Pap collected due to prior dysplasia, further management pending results - Mammogram due in October - Colonoscopy up-to-date  2) Dysuria - UA and culture to be obtained, further management pending results  Orders Placed This Encounter  Procedures  Urine Culture   Urinalysis, Routine w reflex microscopic   POCT Urinalysis Dipstick    Meds: No orders of the defined types were placed in this encounter.   Follow-up: Return in about 1 year (around 10/06/2024) for Annual.   Ottis Sarnowski, DO Attending Obstetrician & Gynecologist, [facility] for [facility]

## 2023-10-08 ENCOUNTER — Ambulatory Visit: Payer: Self-pay | Admitting: Obstetrics & Gynecology

## 2023-10-08 LAB — URINALYSIS, ROUTINE W REFLEX MICROSCOPIC
Bilirubin, UA: NEGATIVE
Glucose, UA: NEGATIVE
Ketones, UA: NEGATIVE
Leukocytes,UA: NEGATIVE
Nitrite, UA: NEGATIVE
Protein,UA: NEGATIVE
RBC, UA: NEGATIVE
Specific Gravity, UA: 1.024 (ref 1.005–1.030)
Urobilinogen, Ur: 0.2 mg/dL (ref 0.2–1.0)
pH, UA: 5.5 (ref 5.0–7.5)

## 2023-10-09 LAB — URINE CULTURE

## 2023-10-11 ENCOUNTER — Encounter: Admitting: Internal Medicine

## 2023-10-13 ENCOUNTER — Encounter: Payer: Self-pay | Admitting: Cardiology

## 2023-10-13 ENCOUNTER — Other Ambulatory Visit: Payer: Self-pay

## 2023-10-13 MED ORDER — AZITHROMYCIN 250 MG PO TABS: 250.0000 mg | ORAL_TABLET | Freq: Every day | ORAL | 0 refills | Status: DC

## 2023-10-14 LAB — CYTOLOGY - PAP
Comment: NEGATIVE
Diagnosis: NEGATIVE
High risk HPV: NEGATIVE

## 2023-10-15 DIAGNOSIS — E039 Hypothyroidism, unspecified: Secondary | ICD-10-CM | POA: Diagnosis not present

## 2023-10-16 LAB — TSH+FREE T4
Free T4: 1.2 ng/dL (ref 0.82–1.77)
TSH: 3.19 u[IU]/mL (ref 0.450–4.500)

## 2023-10-26 ENCOUNTER — Telehealth: Payer: Self-pay

## 2023-10-26 NOTE — Telephone Encounter (Signed)
 Copied from CRM #[id number]. Topic: Clinical - Medication Question >> Oct 26, 2023 10:32 AM Antwanette L wrote: Reason for CRM: The patient  wants to come by the office and get a print out of her current medicine list and allergies. Please contact the patient at [phone].

## 2023-10-27 NOTE — Telephone Encounter (Signed)
 Printed and given to patient.

## 2023-10-28 DIAGNOSIS — L910 Hypertrophic scar: Secondary | ICD-10-CM | POA: Diagnosis not present

## 2023-10-30 ENCOUNTER — Other Ambulatory Visit: Payer: Self-pay | Admitting: Internal Medicine

## 2023-11-02 DIAGNOSIS — K080 Exfoliation of teeth due to systemic causes: Secondary | ICD-10-CM | POA: Diagnosis not present

## 2023-11-10 ENCOUNTER — Ambulatory Visit (INDEPENDENT_AMBULATORY_CARE_PROVIDER_SITE_OTHER): Admitting: Allergy & Immunology

## 2023-11-10 ENCOUNTER — Encounter: Payer: Self-pay | Admitting: Allergy & Immunology

## 2023-11-10 ENCOUNTER — Other Ambulatory Visit: Payer: Self-pay

## 2023-11-10 ENCOUNTER — Telehealth: Payer: Self-pay

## 2023-11-10 VITALS — BP 120/82 | HR 67 | Temp 97.3°F | Resp 18 | Ht 60.24 in | Wt 167.8 lb

## 2023-11-10 DIAGNOSIS — J310 Chronic rhinitis: Secondary | ICD-10-CM

## 2023-11-10 DIAGNOSIS — J4540 Moderate persistent asthma, uncomplicated: Secondary | ICD-10-CM

## 2023-11-10 DIAGNOSIS — B999 Unspecified infectious disease: Secondary | ICD-10-CM | POA: Diagnosis not present

## 2023-11-10 DIAGNOSIS — J45909 Unspecified asthma, uncomplicated: Secondary | ICD-10-CM | POA: Diagnosis not present

## 2023-11-10 DIAGNOSIS — J45998 Other asthma: Secondary | ICD-10-CM | POA: Diagnosis not present

## 2023-11-10 MED ORDER — LEVALBUTEROL TARTRATE 45 MCG/ACT IN AERO: 2.0000 | INHALATION_SPRAY | RESPIRATORY_TRACT | 2 refills | Status: AC | PRN

## 2023-11-10 MED ORDER — LEVALBUTEROL HCL 0.63 MG/3ML IN NEBU: 0.6300 mg | INHALATION_SOLUTION | Freq: Once | RESPIRATORY_TRACT | 3 refills | Status: AC

## 2023-11-10 NOTE — Telephone Encounter (Signed)
 Printed and LVM for pt to inform it will be at the front for pick up.

## 2023-11-10 NOTE — Progress Notes (Signed)
 NEW PATIENT  Date of Service/Encounter:  11/10/23  Consult requested by: Bevely Doffing, FNP   Assessment:   Chronic rhinitis  Moderate persistent asthma, uncomplicated - doing fairly well on Breo  Recurrent infections -  getting immune screen  AEC 100 - biologic naive  History of nasal polyps (10+ years ago in [place] )  Plan/Recommendations:   1. Moderate persistent asthma, uncomplicated - Lung testing looked decent, but it got even better with the levoalbuterol.  - We are going to send in the levoalbuterol since you feels like that works better for you without the shakiness. - Nebulizer provided today.  - Daily controller medication(s): Breo 100/25mcg or 200/25mcg one puff once daily depending - Prior to physical activity: Xopenex  2 puffs 10-15 minutes before physical activity. - Rescue medications: Xopenex  4 puffs every 4-6 hours as needed - Asthma control goals:  * Full participation in all desired activities (may need albuterol  before activity) * Albuterol  use two time or less a week on average (not counting use with activity) * Cough interfering with sleep two time or less a month * Oral steroids no more than once a year * No hospitalizations  2. Chronic rhinitis - Because of insurance stipulations, we cannot do skin testing on the same day as your first visit. - We are all working to fight this, but for now we need to do two separate visits.  - We will know more after we do testing at the next visit.  - The skin testing visit can be squeezed in at your convenience.  - Then we can make a more full plan to address all of your symptoms. - Be sure to stop your antihistamines for 3 days before this appointment.   3. Recurrent infections - We will obtain some screening labs to evaluate your immune system.  - Labs to evaluate the quantitative [facility]) aspects of your immune system: IgG/IgA/IgM, CBC with differential - Labs to evaluate the qualitative (HOW  WELL THEY WORK) aspects of your immune system: CH50, Pneumococcal titers, Tetanus titers, Diphtheria titers - We may consider immunizations with Pneumovax and Tdap to challenge your immune system, and then obtain repeat titers in 4-6 weeks.   4. Return in about 2 weeks (around 11/24/2023) for ALLERGY TESTING (1-55). You can have the follow up appointment with Dr. Iva or a Nurse Practicioner (our Nurse Practitioners are excellent and always have Physician oversight!).   This note in its entirety was forwarded to the Provider who requested this consultation.  Subjective:   Katie Davis is a 68 y.o. female presenting today for evaluation of  Chief Complaint  Patient presents with   Asthma    Had covid 4 times 2022, pulmonary referred because of her inhalers   Establish Care    Katie Davis has a history of the following: Patient Active Problem List   Diagnosis Date Noted   Burn of abdomen 09/10/2022   Caregiver stress 04/21/2022   Prediabetes 02/25/2022   Hyperlipidemia 02/25/2022   Hypothyroidism 02/25/2022   IBS (irritable bowel syndrome) 02/25/2022   History of Raynaud's syndrome 02/25/2022   Severe persistent asthma 02/25/2022   History of Sjogren's disease (HCC) 02/25/2022   History of systemic lupus erythematosus (SLE) (HCC) 02/25/2022   Osteoporosis 02/25/2022   Gastroesophageal reflux disease 01/30/2021   Early satiety 01/30/2021   Bloating 01/30/2021   Celiac disease    Superior mesenteric artery stenosis (HCC)    DOE (dyspnea on exertion) 12/05/2020   Sjogren's syndrome (HCC) 11/03/2020  Alternating constipation and diarrhea 05/07/2020   Encounter for general adult medical examination with abnormal findings 05/07/2020   Nausea without vomiting 05/07/2020   Generalized abdominal pain 10/24/2019   Postmenopausal 12/14/2018   Vasomotor symptoms due to menopause 12/14/2018   History of abnormal cervical Pap smear 12/14/2018    History obtained from: chart  review and patient.  Discussed the use of AI scribe software for clinical note transcription with the patient and/or guardian, who gave verbal consent to proceed.  Katie Davis was referred by Bevely Doffing, FNP.     Katie Davis is a 68 y.o. female presenting for an evaluation of asthma and allergies. She was referred by Dr. Darlean to see us  for allergy testing.    Asthma/Respiratory Symptom History: She has a history of asthma, described as 'full lung asthma' with scarring from previous COVID-19 infections. She uses Breo, 200 mcg during spring, summer, and early fall, and 100 mcg during winter. Breo is effective with no side effects, unlike albuterol  which caused dizziness. She experiences wheezing and a persistent cough, attributed to both asthma and Sjogren's syndrome.  She has had multiple COVID-19 infections in 2022, treated with antibiotics for pneumonia and bronchitis during her first two infections and received Paxlovid during her third infection, which led to a COVID rebound. She underwent extensive pulmonary and cardiac evaluations, including pulmonary function tests and imaging. Pulmonary rehabilitation has improved her breathing, and she continues to exercise regularly, finding that it helps expand her lungs. She is just using the gym near the [employer] She tries to go every day.  She has a residual raspy voice. She has some dry mouth as well. She does not like medications because her body is very sensitive to various medications. So she prefers that.   Allergic Rhinitis Symptom History: She uses Flonase  for nasal symptoms and has been allergy tested in the past, revealing allergies to dust, dust mites, and seasonal trees. She was never on allergy shots.   Infection Symptom History: She has a history of sinus infections and had nasal polyps removed many years ago. She experiences sinus infections periodically, with the most recent one occurring three weeks ago, treated successfully with  two courses of Z-Pak.  She has Sjogren's syndrome, contributing to her respiratory symptoms, including a raspy throat and cough. She does not take medications for Sjogren's due to her sensitivity to medications.  Her social history includes a significant caregiving role for her husband, who has had multiple health issues, including strokes, a pit bull attack, and a recent fall leading to a brain bleed and onset of dementia-like symptoms. This has impacted her ability to travel and requires her to manage her time between caregiving and personal health maintenance.   Otherwise, there is no history of other atopic diseases, including food allergies, drug allergies, stinging insect allergies, eczema, urticaria, or contact dermatitis. There is no significant infectious history. Vaccinations are up to date.    Past Medical History: Patient Active Problem List   Diagnosis Date Noted   Burn of abdomen 09/10/2022   Caregiver stress 04/21/2022   Prediabetes 02/25/2022   Hyperlipidemia 02/25/2022   Hypothyroidism 02/25/2022   IBS (irritable bowel syndrome) 02/25/2022   History of Raynaud's syndrome 02/25/2022   Severe persistent asthma 02/25/2022   History of Sjogren's disease (HCC) 02/25/2022   History of systemic lupus erythematosus (SLE) (HCC) 02/25/2022   Osteoporosis 02/25/2022   Gastroesophageal reflux disease 01/30/2021   Early satiety 01/30/2021   Bloating 01/30/2021   Celiac disease  Superior mesenteric artery stenosis (HCC)    DOE (dyspnea on exertion) 12/05/2020   Sjogren's syndrome (HCC) 11/03/2020   Alternating constipation and diarrhea 05/07/2020   Encounter for general adult medical examination with abnormal findings 05/07/2020   Nausea without vomiting 05/07/2020   Generalized abdominal pain 10/24/2019   Postmenopausal 12/14/2018   Vasomotor symptoms due to menopause 12/14/2018   History of abnormal cervical Pap smear 12/14/2018    Medication List:  Allergies as of  11/10/2023       Reactions   Prednisone Palpitations   Bactrim [sulfamethoxazole-trimethoprim] Nausea And Vomiting   Migraines    Codeine Nausea And Vomiting   Denosumab     Other reaction(s): Unknown   Gluten Meal Other (See Comments)   Ceiliac disease   Influenza Vaccines Swelling   Keflex [cephalexin] Nausea And Vomiting   Levaquin [levofloxacin] Nausea And Vomiting   Sulfa Antibiotics Nausea And Vomiting   Contrast Media [iodinated Contrast Media] Nausea And Vomiting, Rash   Per patient, did fine with premedication with IV steroids before    Doxycycline Palpitations   Made body feel crazy, increased heart rate   Latex Hives, Rash        Medication List        Accurate as of November 10, 2023  1:16 PM. If you have any questions, ask your nurse or doctor.          acidophilus Caps capsule Take 1 capsule by mouth daily.   albuterol  108 (90 Base) MCG/ACT inhaler Commonly known as: ProAir  HFA 1-2 puffs every 4 hours as needed only  if your can't catch your breath   aspirin  EC 81 MG tablet Take 1 tablet (81 mg total) by mouth daily. Swallow whole.   atenolol  50 MG tablet Commonly known as: TENORMIN  Take 1 tablet (50 mg total) by mouth 2 (two) times daily.   azithromycin  250 MG tablet Commonly known as: ZITHROMAX  Take 1 tablet (250 mg total) by mouth daily. Take first 2 tablets together, then 1 every day until finished.   CALCIUM  CARBONATE ANTACID PO Take 2 tablets by mouth daily.   clonazePAM  0.5 MG tablet Commonly known as: KLONOPIN  Take 1 tablet by mouth twice daily as needed for anxiety   cyclobenzaprine  10 MG tablet Commonly known as: FLEXERIL  Take 1 tablet (10 mg total) by mouth 3 (three) times daily as needed for muscle spasms.   ELDERBERRY PO Take 200 mg by mouth daily. 100 mg   Fish Oil 1000 MG Caps Take 2,000 mg by mouth daily.   fluticasone  50 MCG/ACT nasal spray Commonly known as: FLONASE  Place 1 spray into both nostrils daily as needed  (Sinus).   fluticasone  furoate-vilanterol 200-25 MCG/ACT Aepb Commonly known as: Breo Ellipta  Inhale 1 puff into the lungs daily.   Breo Ellipta  100-25 MCG/ACT Aepb Generic drug: fluticasone  furoate-vilanterol Inhale 1 puff by mouth once daily   fluticasone  furoate-vilanterol 100-25 MCG/ACT Aepb Commonly known as: Breo Ellipta  Inhale 1 puff into the lungs daily.   fluticasone  furoate-vilanterol 200-25 MCG/ACT Aepb Commonly known as: Breo Ellipta  Inhale 1 puff into the lungs daily.   ibuprofen  800 MG tablet Commonly known as: ADVIL  TAKE 1 TABLET BY MOUTH EVERY 8 HOURS AS NEEDED FOR MODERATE PAIN   ipratropium 0.03 % nasal spray Commonly known as: ATROVENT  Place 2 sprays into both nostrils every 12 (twelve) hours.   levalbuterol  0.63 MG/3ML nebulizer solution Commonly known as: XOPENEX  Take 3 mLs (0.63 mg total) by nebulization once for 1 dose. Started by:  Charnel Giles Louis Khayri Kargbo   levalbuterol  45 MCG/ACT inhaler Commonly known as: XOPENEX  HFA Inhale 2 puffs into the lungs every 4 (four) hours as needed for wheezing. Started by: Marty Morton Shaggy   linaclotide  290 MCG Caps capsule Commonly known as: Linzess  Take 1 capsule (290 mcg total) by mouth daily before breakfast.   Miebo  1.338 GM/ML Soln Generic drug: Perfluorohexyloctane  Apply 1 Application to eye as needed.   ondansetron  4 MG tablet Commonly known as: ZOFRAN  Take 1 tablet (4 mg total) by mouth every 8 (eight) hours as needed.   pantoprazole  40 MG tablet Commonly known as: PROTONIX  TAKE 1 TABLET BY MOUTH ONCE DAILY BEFORE BREAKFAST   rosuvastatin  5 MG tablet Commonly known as: CRESTOR  Take 1 tablet (5 mg total) by mouth every Monday, Wednesday, and Friday.   Spikevax syringe Generic drug: COVID-19 mRNA vaccine Inject 0.5 mLs into the muscle once.   Synthroid  112 MCG tablet Generic drug: levothyroxine  Take 1 tablet (112 mcg total) by mouth See admin instructions. Alternates every other day with 100  mcg - Brand Name   Systane Hydration PF 0.4-0.3 % Soln Generic drug: Polyethyl Glyc-Propyl Glyc PF Place 1 drop into both eyes daily as needed (Dry eye).   VITAMIN D3 GUMMIES PO Take 6,000 Units by mouth daily. 2000 units        Birth History: non-contributory  Developmental History: non-contributory  Past Surgical History: Past Surgical History:  Procedure Laterality Date   APPENDECTOMY     CHOLECYSTECTOMY     COLONOSCOPY WITH PROPOFOL  N/A 06/10/2020   Surgeon: Cindie Carlin POUR, DO;  diverticulosis and nonbleeding internal hemorrhoids, due for repeat in 2032.   DG THUMB RIGHT HAND ([facility] HX)     EYE SURGERY  2019   Cataract. Right eye   little finger Right    placement of pin   OTHER SURGICAL HISTORY     biopsies on both feet   REDUCTION MAMMAPLASTY       Family History: Family History  Problem Relation Age of Onset   Congestive Heart Failure Mother    Arthritis Mother    Aneurysm Father    Celiac disease Brother    Melanoma Brother    Aneurysm Brother        stomach   Asthma Brother    Cancer Brother    Stroke Sister    Celiac disease Sister    Irritable bowel syndrome Sister    Cancer Sister    Celiac disease Brother    Aneurysm Sister    Other Sister        multiple back surgeries   Colon cancer Neg Hx      Social History: Myesha lives at home with her husband.  She lives in a house that is 41 years old.  There is hardwood throughout the home.  She has central heating and cooling.  There is 1 pack at home.  There are dust mites on the bed and the pillows.  There is no tobacco exposure.  She is currently retired.  There is no fume, chemical, or dust exposure.  There is no pets at home.  She does not live near an interstate or industrial area. She moved here 8 years ago when her husband's job was transferred here (he worked for [employer]).    Review of systems otherwise negative other than that mentioned in the HPI.    Objective:    Blood pressure 120/82, pulse 67, temperature (!) 97.3 F (36.3 C), temperature  source Temporal, resp. rate 18, height 5' 0.24 (1.53 m), weight 167 lb 12.8 oz (76.1 kg), SpO2 97%. Body mass index is 32.51 kg/m.     Physical Exam Vitals reviewed.  Constitutional:      Appearance: She is well-developed.     Comments: Very talkative. Friendly. Amusing.   HENT:     Head: Normocephalic and atraumatic.     Right Ear: Tympanic membrane, ear canal and external ear normal. No drainage, swelling or tenderness. Tympanic membrane is not injected, scarred, erythematous, retracted or bulging.     Left Ear: Tympanic membrane, ear canal and external ear normal. No drainage, swelling or tenderness. Tympanic membrane is not injected, scarred, erythematous, retracted or bulging.     Nose: No nasal deformity, septal deviation, mucosal edema or rhinorrhea.     Right Turbinates: Enlarged, swollen and pale.     Left Turbinates: Enlarged, swollen and pale.     Right Sinus: No maxillary sinus tenderness or frontal sinus tenderness.     Left Sinus: No maxillary sinus tenderness or frontal sinus tenderness.     Mouth/Throat:     Mouth: Mucous membranes are not pale and not dry.     Pharynx: Uvula midline.  Eyes:     General: Lids are normal. No allergic shiner.       Right eye: No discharge.        Left eye: No discharge.     Conjunctiva/sclera: Conjunctivae normal.     Right eye: Right conjunctiva is not injected. No chemosis.    Left eye: Left conjunctiva is not injected. No chemosis.    Pupils: Pupils are equal, round, and reactive to light.  Cardiovascular:     Rate and Rhythm: Normal rate and regular rhythm.     Heart sounds: Normal heart sounds.  Pulmonary:     Effort: Pulmonary effort is normal. No tachypnea, accessory muscle usage or respiratory distress.     Breath sounds: Normal breath sounds. No wheezing, rhonchi or rales.  Chest:     Chest wall: No tenderness.  Abdominal:      Tenderness: There is no abdominal tenderness. There is no guarding or rebound.  Lymphadenopathy:     Head:     Right side of head: No submandibular, tonsillar or occipital adenopathy.     Left side of head: No submandibular, tonsillar or occipital adenopathy.     Cervical: No cervical adenopathy.  Skin:    Coloration: Skin is not pale.     Findings: No abrasion, erythema, petechiae or rash. Rash is not papular, urticarial or vesicular.  Neurological:     Mental Status: She is alert.  Psychiatric:        Behavior: Behavior is cooperative.      Diagnostic studies:    Spirometry: results normal (FEV1: 1.54/77%, FVC: 2.01/79%, FEV1/FVC: 77%).    Spirometry consistent with normal pattern. Xopenex  four puffs via MDI treatment given in clinic with no improvement.  Allergy Studies: deferred due to insurance stipulations that require a separate visit for testing              Marty Shaggy, MD [facility] 

## 2023-11-10 NOTE — Telephone Encounter (Signed)
 Copied from CRM [id number]. Topic: General - Other >> Nov 10, 2023  8:58 AM Gustabo D wrote: Patient wants copy of her allergies and medication. It's needed for a appointment.

## 2023-11-10 NOTE — Patient Instructions (Addendum)
 1. Moderate persistent asthma, uncomplicated - Lung testing looked decent, but it got even better with the levoalbuterol.  - We are going to send in the levoalbuterol since you feels like that works better for you without the shakiness. - Nebulizer provided today.  - Daily controller medication(s): Breo 100/25mcg or 200/25mcg one puff once daily depending - Prior to physical activity: Xopenex  2 puffs 10-15 minutes before physical activity. - Rescue medications: Xopenex  4 puffs every 4-6 hours as needed - Asthma control goals:  * Full participation in all desired activities (may need albuterol  before activity) * Albuterol  use two time or less a week on average (not counting use with activity) * Cough interfering with sleep two time or less a month * Oral steroids no more than once a year * No hospitalizations  2. Chronic rhinitis - Because of insurance stipulations, we cannot do skin testing on the same day as your first visit. - We are all working to fight this, but for now we need to do two separate visits.  - We will know more after we do testing at the next visit.  - The skin testing visit can be squeezed in at your convenience.  - Then we can make a more full plan to address all of your symptoms. - Be sure to stop your antihistamines for 3 days before this appointment.   3. Recurrent infections - We will obtain some screening labs to evaluate your immune system.  - Labs to evaluate the quantitative [facility]) aspects of your immune system: IgG/IgA/IgM, CBC with differential - Labs to evaluate the qualitative (HOW WELL THEY WORK) aspects of your immune system: CH50, Pneumococcal titers, Tetanus titers, Diphtheria titers - We may consider immunizations with Pneumovax and Tdap to challenge your immune system, and then obtain repeat titers in 4-6 weeks.   4. Return in about 2 weeks (around 11/24/2023) for ALLERGY TESTING (1-55). You can have the follow up appointment with Dr. Iva or  a Nurse Practicioner (our Nurse Practitioners are excellent and always have Physician oversight!).    Please inform us  of any Emergency Department visits, hospitalizations, or changes in symptoms. Call us  before going to the ED for breathing or allergy symptoms since we might be able to fit you in for a sick visit. Feel free to contact us  anytime with any questions, problems, or concerns.  It was a pleasure to meet you today!  Websites that have reliable patient information: 1. American Academy of Asthma, Allergy, and Immunology: www.aaaai.org 2. Food Allergy Research and Education (FARE): foodallergy.org 3. Mothers of Asthmatics: http://www.asthmacommunitynetwork.org 4. American College of Allergy, Asthma, and Immunology: www.acaai.org      "Like" us  on Facebook and Instagram for our latest updates!      A healthy democracy works best when [employer] participate! Make sure you are registered to vote! If you have moved or changed any of your contact information, you will need to get this updated before voting! Scan the QR codes below to learn more!

## 2023-11-14 ENCOUNTER — Encounter: Payer: Self-pay | Admitting: Allergy & Immunology

## 2023-11-18 LAB — CBC WITH DIFF/PLATELET
Basophils Absolute: 0 x10E3/uL (ref 0.0–0.2)
Basos: 0 %
EOS (ABSOLUTE): 0 x10E3/uL (ref 0.0–0.4)
Eos: 1 %
Hematocrit: 42.5 % (ref 34.0–46.6)
Hemoglobin: 14.3 g/dL (ref 11.1–15.9)
Immature Grans (Abs): 0 x10E3/uL (ref 0.0–0.1)
Immature Granulocytes: 0 %
Lymphocytes Absolute: 1.1 x10E3/uL (ref 0.7–3.1)
Lymphs: 24 %
MCH: 32.6 pg (ref 26.6–33.0)
MCHC: 33.6 g/dL (ref 31.5–35.7)
MCV: 97 fL (ref 79–97)
Monocytes Absolute: 0.2 x10E3/uL (ref 0.1–0.9)
Monocytes: 5 %
Neutrophils Absolute: 3.2 x10E3/uL (ref 1.4–7.0)
Neutrophils: 70 %
Platelets: 233 x10E3/uL (ref 150–450)
RBC: 4.38 x10E6/uL (ref 3.77–5.28)
RDW: 13.6 % (ref 11.7–15.4)
WBC: 4.6 x10E3/uL (ref 3.4–10.8)

## 2023-11-18 LAB — IGG, IGA, IGM
IgA/Immunoglobulin A, Serum: 240 mg/dL (ref 87–352)
IgG (Immunoglobin G), Serum: 1418 mg/dL (ref 586–1602)
IgM (Immunoglobulin M), Srm: 52 mg/dL (ref 26–217)

## 2023-11-18 LAB — STREP PNEUMONIAE 23 SEROTYPES IGG
Pneumo Ab Type 1*: 0.3 ug/mL — AB (ref >1.3)
Pneumo Ab Type 12 (12F)*: <0.1 ug/mL — AB (ref >1.3)
Pneumo Ab Type 14*: 2.1 ug/mL (ref >1.3)
Pneumo Ab Type 17 (17F)*: <0.1 ug/mL — AB (ref >1.3)
Pneumo Ab Type 19 (19F)*: <0.1 ug/mL — AB (ref >1.3)
Pneumo Ab Type 2*: 0.2 ug/mL — AB (ref >1.3)
Pneumo Ab Type 20*: <0.2 ug/mL — AB (ref >1.3)
Pneumo Ab Type 22 (22F)*: <0.1 ug/mL — AB (ref >1.3)
Pneumo Ab Type 23 (23F)*: 0.1 ug/mL — AB (ref >1.3)
Pneumo Ab Type 26 (6B)*: <0.1 ug/mL — AB (ref >1.3)
Pneumo Ab Type 3*: <0.1 ug/mL — AB (ref >1.3)
Pneumo Ab Type 34 (10A)*: 0.1 ug/mL — AB (ref >1.3)
Pneumo Ab Type 4*: 0.2 ug/mL — AB (ref >1.3)
Pneumo Ab Type 43 (11A)*: <0.1 ug/mL — AB (ref >1.3)
Pneumo Ab Type 5*: <0.1 ug/mL — AB (ref >1.3)
Pneumo Ab Type 51 (7F)*: 0.3 ug/mL — AB (ref >1.3)
Pneumo Ab Type 54 (15B)*: 1.9 ug/mL (ref >1.3)
Pneumo Ab Type 56 (18C)*: <0.1 ug/mL — AB (ref >1.3)
Pneumo Ab Type 57 (19A)*: 0.6 ug/mL — AB (ref >1.3)
Pneumo Ab Type 68 (9V)*: 0.1 ug/mL — AB (ref >1.3)
Pneumo Ab Type 70 (33F)*: 1.2 ug/mL — AB (ref >1.3)
Pneumo Ab Type 8*: 3.2 ug/mL (ref >1.3)
Pneumo Ab Type 9 (9N)*: 0.8 ug/mL — AB (ref >1.3)

## 2023-11-18 LAB — COMPLEMENT, TOTAL: Compl, Total (CH50): >60 U/mL (ref >41)

## 2023-11-18 LAB — DIPHTHERIA / TETANUS ANTIBODY PANEL
Diphtheria Ab: 0.54 [IU]/mL (ref <0.10)
Tetanus Ab, IgG: 2.45 [IU]/mL (ref <0.10)

## 2023-11-19 ENCOUNTER — Ambulatory Visit: Payer: Self-pay | Admitting: Allergy & Immunology

## 2023-11-22 ENCOUNTER — Other Ambulatory Visit: Payer: Self-pay | Admitting: *Deleted

## 2023-11-22 DIAGNOSIS — B999 Unspecified infectious disease: Secondary | ICD-10-CM

## 2023-11-24 ENCOUNTER — Ambulatory Visit: Admitting: Allergy & Immunology

## 2023-11-29 ENCOUNTER — Other Ambulatory Visit: Payer: Self-pay

## 2023-11-29 DIAGNOSIS — Z636 Dependent relative needing care at home: Secondary | ICD-10-CM

## 2023-11-29 MED ORDER — CLONAZEPAM 0.5 MG PO TABS: 0.5000 mg | ORAL_TABLET | Freq: Two times a day (BID) | ORAL | 0 refills | Status: DC | PRN

## 2023-12-10 DIAGNOSIS — E890 Postprocedural hypothyroidism: Secondary | ICD-10-CM | POA: Diagnosis not present

## 2023-12-11 DIAGNOSIS — J45909 Unspecified asthma, uncomplicated: Secondary | ICD-10-CM | POA: Diagnosis not present

## 2023-12-20 DIAGNOSIS — E890 Postprocedural hypothyroidism: Secondary | ICD-10-CM | POA: Diagnosis not present

## 2023-12-20 DIAGNOSIS — E0500 Thyrotoxicosis with diffuse goiter without thyrotoxic crisis or storm: Secondary | ICD-10-CM | POA: Diagnosis not present

## 2023-12-26 ENCOUNTER — Other Ambulatory Visit: Payer: Self-pay | Admitting: Internal Medicine

## 2023-12-29 DIAGNOSIS — D2272 Melanocytic nevi of left lower limb, including hip: Secondary | ICD-10-CM | POA: Diagnosis not present

## 2024-01-10 ENCOUNTER — Other Ambulatory Visit: Payer: Self-pay

## 2024-01-10 DIAGNOSIS — J45909 Unspecified asthma, uncomplicated: Secondary | ICD-10-CM | POA: Diagnosis not present

## 2024-01-10 DIAGNOSIS — J45998 Other asthma: Secondary | ICD-10-CM | POA: Diagnosis not present

## 2024-01-10 MED ORDER — AZITHROMYCIN 250 MG PO TABS: ORAL_TABLET | ORAL | 0 refills | Status: AC

## 2024-01-12 ENCOUNTER — Other Ambulatory Visit (HOSPITAL_COMMUNITY): Payer: Self-pay

## 2024-01-12 DIAGNOSIS — M79651 Pain in right thigh: Secondary | ICD-10-CM | POA: Diagnosis not present

## 2024-01-12 DIAGNOSIS — Z1231 Encounter for screening mammogram for malignant neoplasm of breast: Secondary | ICD-10-CM

## 2024-01-19 ENCOUNTER — Telehealth: Payer: Self-pay

## 2024-01-19 ENCOUNTER — Other Ambulatory Visit: Payer: Self-pay

## 2024-01-19 DIAGNOSIS — E782 Mixed hyperlipidemia: Secondary | ICD-10-CM

## 2024-01-19 DIAGNOSIS — M810 Age-related osteoporosis without current pathological fracture: Secondary | ICD-10-CM

## 2024-01-19 DIAGNOSIS — R7303 Prediabetes: Secondary | ICD-10-CM

## 2024-01-19 DIAGNOSIS — E039 Hypothyroidism, unspecified: Secondary | ICD-10-CM

## 2024-01-19 NOTE — Telephone Encounter (Signed)
 Mychart message sent to laura on 11/3

## 2024-01-19 NOTE — Telephone Encounter (Signed)
 Copied from CRM [id number]. Topic: Clinical - Request for Lab/Test Order >> Jan 19, 2024 11:33 AM Treva T wrote: Reason for CRM: Patient is calling to check status of message sent via MyChart in regards to having fasting lab tests drawn prior to upcoming appointment with provider on 01/21/24.  Patient can be reached back at [phone] to advise further.   Patient is aware of same day call back.

## 2024-01-19 NOTE — Telephone Encounter (Signed)
 Labs ordered.

## 2024-01-20 NOTE — Telephone Encounter (Signed)
 Pt advised with verbal understanding

## 2024-01-21 ENCOUNTER — Ambulatory Visit

## 2024-02-02 ENCOUNTER — Other Ambulatory Visit: Payer: Self-pay

## 2024-02-02 DIAGNOSIS — Z636 Dependent relative needing care at home: Secondary | ICD-10-CM

## 2024-02-02 MED ORDER — CLONAZEPAM 0.5 MG PO TABS: 0.5000 mg | ORAL_TABLET | Freq: Two times a day (BID) | ORAL | 0 refills | Status: DC | PRN

## 2024-02-04 DIAGNOSIS — E890 Postprocedural hypothyroidism: Secondary | ICD-10-CM | POA: Diagnosis not present

## 2024-02-04 DIAGNOSIS — B999 Unspecified infectious disease: Secondary | ICD-10-CM | POA: Diagnosis not present

## 2024-02-07 ENCOUNTER — Ambulatory Visit (HOSPITAL_COMMUNITY)
Admission: RE | Admit: 2024-02-07 | Discharge: 2024-02-07 | Disposition: A | Discharge status: Home / Self Care | Source: Ambulatory Visit

## 2024-02-07 DIAGNOSIS — Z1231 Encounter for screening mammogram for malignant neoplasm of breast: Secondary | ICD-10-CM | POA: Insufficient documentation

## 2024-02-09 ENCOUNTER — Other Ambulatory Visit: Payer: Self-pay | Admitting: Internal Medicine

## 2024-02-10 DIAGNOSIS — J45909 Unspecified asthma, uncomplicated: Secondary | ICD-10-CM | POA: Diagnosis not present

## 2024-02-10 DIAGNOSIS — J45998 Other asthma: Secondary | ICD-10-CM | POA: Diagnosis not present

## 2024-02-12 LAB — STREP PNEUMONIAE 23 SEROTYPES IGG
Pneumo Ab Type 1*: 0.8 ug/mL — ABNORMAL LOW (ref >1.3)
Pneumo Ab Type 12 (12F)*: 0.2 ug/mL — ABNORMAL LOW (ref >1.3)
Pneumo Ab Type 14*: >30.6 ug/mL (ref >1.3)
Pneumo Ab Type 17 (17F)*: <0.1 ug/mL — ABNORMAL LOW (ref >1.3)
Pneumo Ab Type 19 (19F)*: 1.2 ug/mL — ABNORMAL LOW (ref >1.3)
Pneumo Ab Type 2*: 0.3 ug/mL — ABNORMAL LOW (ref >1.3)
Pneumo Ab Type 20*: 0.3 ug/mL — ABNORMAL LOW (ref >1.3)
Pneumo Ab Type 22 (22F)*: 0.4 ug/mL — ABNORMAL LOW (ref >1.3)
Pneumo Ab Type 23 (23F)*: 0.9 ug/mL — ABNORMAL LOW (ref >1.3)
Pneumo Ab Type 26 (6B)*: 0.2 ug/mL — ABNORMAL LOW (ref >1.3)
Pneumo Ab Type 3*: 0.1 ug/mL — ABNORMAL LOW (ref >1.3)
Pneumo Ab Type 34 (10A)*: 3 ug/mL (ref >1.3)
Pneumo Ab Type 4*: 2.5 ug/mL (ref >1.3)
Pneumo Ab Type 43 (11A)*: 0.1 ug/mL — ABNORMAL LOW (ref >1.3)
Pneumo Ab Type 5*: <0.1 ug/mL — ABNORMAL LOW (ref >1.3)
Pneumo Ab Type 51 (7F)*: 0.9 ug/mL — ABNORMAL LOW (ref >1.3)
Pneumo Ab Type 54 (15B)*: >49.3 ug/mL (ref >1.3)
Pneumo Ab Type 56 (18C)*: <0.1 ug/mL — ABNORMAL LOW (ref >1.3)
Pneumo Ab Type 57 (19A)*: 1.1 ug/mL — ABNORMAL LOW (ref >1.3)
Pneumo Ab Type 68 (9V)*: 1.2 ug/mL — ABNORMAL LOW (ref >1.3)
Pneumo Ab Type 70 (33F)*: 4 ug/mL (ref >1.3)
Pneumo Ab Type 8*: 13.7 ug/mL (ref >1.3)
Pneumo Ab Type 9 (9N)*: 0.8 ug/mL — ABNORMAL LOW (ref >1.3)

## 2024-02-15 ENCOUNTER — Other Ambulatory Visit: Payer: Self-pay

## 2024-02-15 ENCOUNTER — Ambulatory Visit: Payer: Self-pay | Admitting: Allergy & Immunology

## 2024-02-16 ENCOUNTER — Ambulatory Visit: Admitting: Family Medicine

## 2024-02-16 ENCOUNTER — Encounter: Payer: Self-pay | Admitting: Family Medicine

## 2024-02-16 ENCOUNTER — Ambulatory Visit (HOSPITAL_COMMUNITY)
Admission: RE | Admit: 2024-02-16 | Discharge: 2024-02-16 | Disposition: A | Source: Ambulatory Visit | Attending: Family Medicine | Admitting: Family Medicine

## 2024-02-16 VITALS — BP 122/74 | HR 66 | Ht 63.0 in | Wt 162.0 lb

## 2024-02-16 DIAGNOSIS — R1084 Generalized abdominal pain: Secondary | ICD-10-CM | POA: Insufficient documentation

## 2024-02-16 DIAGNOSIS — R918 Other nonspecific abnormal finding of lung field: Secondary | ICD-10-CM | POA: Insufficient documentation

## 2024-02-16 DIAGNOSIS — R1032 Left lower quadrant pain: Secondary | ICD-10-CM | POA: Diagnosis not present

## 2024-02-16 DIAGNOSIS — K5730 Diverticulosis of large intestine without perforation or abscess without bleeding: Secondary | ICD-10-CM | POA: Diagnosis not present

## 2024-02-16 DIAGNOSIS — K5792 Diverticulitis of intestine, part unspecified, without perforation or abscess without bleeding: Secondary | ICD-10-CM | POA: Diagnosis not present

## 2024-02-16 MED ORDER — AZITHROMYCIN 250 MG PO TABS: ORAL_TABLET | ORAL | 0 refills | Status: AC

## 2024-02-16 NOTE — Assessment & Plan Note (Signed)
 CT scan was obtained and was negative in regards to abdominal pathology.  Continue current medications.

## 2024-02-16 NOTE — Progress Notes (Signed)
 Subjective:  Patient ID: Katie Davis, female    DOB: [birth year]  Age: 68 y.o. MRN: [id number]  CC:   Chief Complaint  Patient presents with   Abdominal Pain    Is not sure if it is a diverticulitis flare up or is she backed up again    Nasal Congestion    Nasal and chest congestion     HPI:  68 year old female presents for evaluation of the above.  Patient has underlying celiac disease as well as IBS.  Has a long history of abdominal issues.  Has also had diverticulitis in the past.  She reports that over the past 1.5 weeks she has had worsening abdominal pain.  She has had upper abdominal pain as well as lower abdominal pain.  She states that this is not her typical pain and she is concerned about the possibility of diverticulitis.  She has known sigmoid diverticulosis.  Patient also reports ongoing congestion, both upper and lower.  This has been going on for the past 3 to 4 days.  Has longstanding history of severe asthma.  Patient Active Problem List   Diagnosis Date Noted   Generalized abdominal pain 02/16/2024   Ground glass opacity present on imaging of lung 02/16/2024   Prediabetes 02/25/2022   Hyperlipidemia 02/25/2022   Hypothyroidism 02/25/2022   IBS (irritable bowel syndrome) 02/25/2022   History of Raynaud's syndrome 02/25/2022   Severe persistent asthma (HCC) 02/25/2022   History of Sjogren's disease 02/25/2022   History of systemic lupus erythematosus (SLE) (HCC) 02/25/2022   Osteoporosis 02/25/2022   Gastroesophageal reflux disease 01/30/2021   Early satiety 01/30/2021   Celiac disease    Superior mesenteric artery stenosis    DOE (dyspnea on exertion) 12/05/2020   Sjogren's syndrome 11/03/2020   Postmenopausal 12/14/2018   Vasomotor symptoms due to menopause 12/14/2018   History of abnormal cervical Pap smear 12/14/2018    Social Hx   Social History   Socioeconomic History   Marital status: Married    Spouse name: Not on file   Number of  children: Not on file   Years of education: Not on file   Highest education level: Some college, no degree  Occupational History   Not on file  Tobacco Use   Smoking status: Never    Passive exposure: Never   Smokeless tobacco: Never  Vaping Use   Vaping status: Never Used  Substance and Sexual Activity   Alcohol use: Not Currently    Comment: wine occ   Drug use: Never   Sexual activity: Not Currently    Birth control/protection: Abstinence, Post-menopausal, None  Other Topics Concern   Not on file  Social History Narrative   Not on file   Social Drivers of Health   Financial Resource Strain: Low Risk  (01/17/2024)   Overall Financial Resource Strain (CARDIA)    Difficulty of Paying Living Expenses: Not very hard  Food Insecurity: No Food Insecurity (01/17/2024)   Hunger Vital Sign    Worried About Running Out of Food in the Last Year: Never true    Ran Out of Food in the Last Year: Never true  Transportation Needs: No Transportation Needs (01/17/2024)   PRAPARE - Administrator, Civil Service (Medical): No    Lack of Transportation (Non-Medical): No  Physical Activity: Insufficiently Active (01/17/2024)   Exercise Vital Sign    Days of Exercise per Week: 3 days    Minutes of Exercise per Session: 40 min  Stress:  Stress Concern Present (01/17/2024)   [employer] of Occupational Health - Occupational Stress Questionnaire    Feeling of Stress: To some extent  Social Connections: Moderately Isolated (01/17/2024)   Social Connection and Isolation Panel    Frequency of Communication with Friends and Family: More than three times a week    Frequency of Social Gatherings with Friends and Family: Patient declined    Attends Religious Services: Patient declined    Database Administrator or Organizations: No    Attends Engineer, Structural: Not on file    Marital Status: Married    Review of Systems Per HPI  Objective:  BP 122/74   Pulse 66   Ht 5'  3 (1.6 m)   Wt 162 lb (73.5 kg)   SpO2 98%   BMI 28.70 kg/m      02/16/2024    9:41 AM 11/10/2023   10:16 AM 10/07/2023   11:39 AM  BP/Weight  Systolic BP 122 120 123  Diastolic BP 74 82 72  Wt. (Lbs) 162 167.8 168.4  BMI 28.7 kg/m2 32.51 kg/m2 29.83 kg/m2    Physical Exam Vitals and nursing note reviewed.  Constitutional:      General: She is not in acute distress.    Appearance: Normal appearance.  HENT:     Head: Normocephalic and atraumatic.  Cardiovascular:     Rate and Rhythm: Normal rate and regular rhythm.  Pulmonary:     Effort: Pulmonary effort is normal.     Breath sounds: Normal breath sounds. No wheezing or rales.  Abdominal:     General: There is no distension.     Palpations: Abdomen is soft.     Comments: This in the epigastric region.  She has minimal tenderness in the lower abdomen.  Neurological:     Mental Status: She is alert.     Lab Results  Component Value Date   WBC 4.6 11/11/2023   HGB 14.3 11/11/2023   HCT 42.5 11/11/2023   PLT 233 11/11/2023   GLUCOSE 103 (H) 07/16/2023   CHOL 187 07/16/2023   TRIG 169 (H) 07/16/2023   HDL 42 07/16/2023   LDLCALC 115 (H) 07/16/2023   ALT 15 07/16/2023   AST 19 07/16/2023   NA 141 07/16/2023   K 4.3 07/16/2023   CL 104 07/16/2023   CREATININE 0.59 07/16/2023   BUN 17 07/16/2023   CO2 19 (L) 07/16/2023   TSH 3.190 10/15/2023   HGBA1C 5.6 07/16/2023     Assessment & Plan:  Generalized abdominal pain Assessment & Plan: CT scan was obtained and was negative in regards to abdominal pathology.  Continue current medications.  Orders: -     CT ABDOMEN PELVIS WO CONTRAST  Ground glass opacity present on imaging of lung Assessment & Plan: Patient has some lung opacities noted.  This is likely postinflammatory change.  However, this is new based on prior CT.  CT recommended for further evaluation.  Arranging.  Given respiratory symptoms, placing on azithromycin .  Orders: -     CT CHEST WO  CONTRAST -     Azithromycin ; Take 2 tablets on day 1, then 1 tablet daily on days 2 through 5  Dispense: 6 tablet; Refill: 0    Follow-up:  Pending Chest CT  Traveion Ruddock DO [facility]

## 2024-02-16 NOTE — Patient Instructions (Signed)
 We will call with results and I will send in medication accordingly.  Take care  Dr. Bluford

## 2024-02-16 NOTE — Assessment & Plan Note (Signed)
 Patient has some lung opacities noted.  This is likely postinflammatory change.  However, this is new based on prior CT.  CT recommended for further evaluation.  Arranging.  Given respiratory symptoms, placing on azithromycin .

## 2024-02-18 ENCOUNTER — Other Ambulatory Visit: Payer: Self-pay | Admitting: Internal Medicine

## 2024-02-21 ENCOUNTER — Other Ambulatory Visit: Payer: Self-pay

## 2024-02-22 ENCOUNTER — Ambulatory Visit

## 2024-02-22 VITALS — BP 120/71 | HR 121 | Ht 63.0 in | Wt 152.0 lb

## 2024-02-22 DIAGNOSIS — R918 Other nonspecific abnormal finding of lung field: Secondary | ICD-10-CM

## 2024-02-22 DIAGNOSIS — R0602 Shortness of breath: Secondary | ICD-10-CM | POA: Diagnosis not present

## 2024-02-22 NOTE — Progress Notes (Unsigned)
   Established Patient Office Visit  Subjective   Patient ID: Katie Davis, female    DOB: 18-Apr-1955  Age: 68 y.o. MRN: [id number]  Chief Complaint  Patient presents with   Medical Management of Chronic Issues    Pt wants to see Dr. Shellia [facility] in [place] not too happy with Dr.Wert   HPI  Patient Active Problem List   Diagnosis Date Noted   Generalized abdominal pain 02/16/2024   Ground glass opacity present on imaging of lung 02/16/2024   Prediabetes 02/25/2022   Hyperlipidemia 02/25/2022   Hypothyroidism 02/25/2022   IBS (irritable bowel syndrome) 02/25/2022   History of Raynaud's syndrome 02/25/2022   Severe persistent asthma (HCC) 02/25/2022   History of Sjogren's disease 02/25/2022   History of systemic lupus erythematosus (SLE) (HCC) 02/25/2022   Osteoporosis 02/25/2022   Gastroesophageal reflux disease 01/30/2021   Early satiety 01/30/2021   Celiac disease    Superior mesenteric artery stenosis    DOE (dyspnea on exertion) 12/05/2020   Sjogren's syndrome 11/03/2020   Postmenopausal 12/14/2018   Vasomotor symptoms due to menopause 12/14/2018   History of abnormal cervical Pap smear 12/14/2018    ROS    Objective:     BP 120/71   Pulse (!) 121   Ht 5' 3 (1.6 m)   Wt 152 lb 0.6 oz (69 kg)   SpO2 97%   BMI 26.93 kg/m  BP Readings from Last 3 Encounters:  02/22/24 120/71  02/16/24 122/74  11/10/23 120/82   Wt Readings from Last 3 Encounters:  02/22/24 152 lb 0.6 oz (69 kg)  02/16/24 162 lb (73.5 kg)  11/10/23 167 lb 12.8 oz (76.1 kg)     Physical Exam   No results found for any visits on 02/22/24.  {Labs (Optional):23779}  The 10-year ASCVD risk score (Arnett DK, et al., 2019) is: 8.6%    Assessment & Plan:   Problem List Items Addressed This Visit   None   No follow-ups on file.    Leita Longs, FNP

## 2024-02-23 ENCOUNTER — Ambulatory Visit (HOSPITAL_COMMUNITY)

## 2024-02-23 NOTE — Assessment & Plan Note (Addendum)
 CT scan shows nodular thickening and ground-glass opacities in lower lobes. Differential includes scar tissue, inflammation, or early cancer. Previous lung issues post-COVID-19. Symptoms include congestion and cough with sputum. - Ordered follow-up CT scan to assess lung nodules. - Referred to Dr. Shellia for further evaluation. - Advised use of guaifenesin for congestion. - Recommended cool mist humidifier in bedroom. - Advised against immediate second dose of Z-Pak; wait for further evaluation.

## 2024-02-23 NOTE — Assessment & Plan Note (Signed)
 CT scan shows nodular thickening and ground-glass opacities in lower lobes. Differential includes scar tissue, inflammation, or early cancer. Previous lung issues post-COVID-19. Symptoms include congestion and cough with sputum. - Ordered follow-up CT scan to assess lung nodules. - Referred to Dr. Shellia for further evaluation. - Advised use of guaifenesin for congestion. - Recommended cool mist humidifier in bedroom.

## 2024-02-24 ENCOUNTER — Other Ambulatory Visit: Payer: Self-pay | Admitting: Internal Medicine

## 2024-02-24 ENCOUNTER — Ambulatory Visit (HOSPITAL_COMMUNITY): Admission: RE | Admit: 2024-02-24 | Discharge: 2024-02-24 | Attending: Family Medicine | Admitting: Family Medicine

## 2024-02-24 DIAGNOSIS — R918 Other nonspecific abnormal finding of lung field: Secondary | ICD-10-CM | POA: Diagnosis present

## 2024-02-25 ENCOUNTER — Telehealth: Payer: Self-pay | Admitting: *Deleted

## 2024-02-25 DIAGNOSIS — J4550 Severe persistent asthma, uncomplicated: Secondary | ICD-10-CM | POA: Diagnosis not present

## 2024-02-25 DIAGNOSIS — R918 Other nonspecific abnormal finding of lung field: Secondary | ICD-10-CM | POA: Diagnosis not present

## 2024-02-25 MED ORDER — AZITHROMYCIN 500 MG PO TABS: 500.0000 mg | ORAL_TABLET | ORAL | 1 refills | Status: AC

## 2024-02-25 NOTE — Telephone Encounter (Unsigned)
 Copied from CRM #[id number]. Topic: General - Other >> Feb 24, 2024  4:49 PM Delon DASEN wrote: Reason for CRM: Had CT done and wanted to inform Dr Bluford

## 2024-02-28 NOTE — Telephone Encounter (Unsigned)
 Copied from CRM #[id number]. Topic: Clinical - Lab/Test Results >> Feb 28, 2024 11:18 AM Ivette P wrote: Reason for CRM: Pt called in about her CT scans and wondering if Dr. Bluford can speed up process by calling radiology to get results of CT scan. Pt is very concerned.   first cat scan is full chest. ct was not put in stat. Dr. bluford can cntact [place] [facility] to read it quicker. so pt not waiting so long and would like to get an answer as soon possible.   [facility] [address] Radiology [address] [place],  [place]  [address] Main: [phone]

## 2024-02-29 NOTE — Telephone Encounter (Signed)
 Pt called following up on this request. She says her pulmonologist is also requesting for this to be called in STAT so that he can have the results as well. She says her PCP should be able to see the pulmonologist's commentary on her imaging results. Please advise.

## 2024-03-01 NOTE — Telephone Encounter (Signed)
 Unfortunately, since it was not initially ordered as a STAT read, we can not change it to a STAT read.  This was initially ordered by Dr. Bluford as well.  I will reach out to radiology to see if they can get it read immediately.

## 2024-03-01 NOTE — Telephone Encounter (Signed)
 Noted

## 2024-03-02 ENCOUNTER — Other Ambulatory Visit: Payer: Self-pay

## 2024-03-03 ENCOUNTER — Encounter: Payer: Self-pay | Admitting: Allergy & Immunology

## 2024-03-03 ENCOUNTER — Ambulatory Visit: Admitting: Allergy & Immunology

## 2024-03-03 ENCOUNTER — Other Ambulatory Visit: Payer: Self-pay

## 2024-03-03 VITALS — BP 108/82 | HR 68 | Temp 98.8°F | Wt 199.8 lb

## 2024-03-03 DIAGNOSIS — R918 Other nonspecific abnormal finding of lung field: Secondary | ICD-10-CM | POA: Diagnosis not present

## 2024-03-03 DIAGNOSIS — J4540 Moderate persistent asthma, uncomplicated: Secondary | ICD-10-CM

## 2024-03-03 DIAGNOSIS — D806 Antibody deficiency with near-normal immunoglobulins or with hyperimmunoglobulinemia: Secondary | ICD-10-CM

## 2024-03-03 DIAGNOSIS — B999 Unspecified infectious disease: Secondary | ICD-10-CM

## 2024-03-03 DIAGNOSIS — J310 Chronic rhinitis: Secondary | ICD-10-CM

## 2024-03-03 NOTE — Patient Instructions (Addendum)
 1. Moderate persistent asthma, uncomplicated - Lung testing not done today. - We are going to send in the levoalbuterol since you feels like that works better for you without the shakiness. - Nebulizer provided today.  - Daily controller medication(s): Breo 100/25mcg or 200/25mcg one puff once daily depending - Prior to physical activity: Xopenex  2 puffs 10-15 minutes before physical activity. - Rescue medications: Xopenex  4 puffs every 4-6 hours as needed - Asthma control goals:  * Full participation in all desired activities (may need albuterol  before activity) * Albuterol  use two time or less a week on average (not counting use with activity) * Cough interfering with sleep two time or less a month * Oral steroids no more than once a year * No hospitalizations  2. Chronic rhinitis - We can always do skin testing to look for allergies in the future if needed.   3. Recurrent infections - It is ok if you do not want to take the prophylactic (preventative) antibiotics. - We could do a different antibiotic, but it would be daily which is why I prefer the azithromycin .  - I would consider establishing care with [facility] since they are much more well-versed on holistic medicine: http://gray.org/ - I think you are going to get more from them considering what you are looking for.   - I would call them to see if they do video visits since you cannot get there in person.  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  - Treatment centers on preventative antibiotics, but we could also do immunoglobulin infusions to boost your immunity. - These are infusions of immunoglobulin purified from multiple donors (from plasma centers).  - These infusions can be done at home.   4. Return in about 6 weeks (around 04/14/2024). You can have the follow up appointment with Dr. Iva or a Nurse Practicioner (our Nurse Practitioners are  excellent and always have Physician oversight!).    Please inform us  of any Emergency Department visits, hospitalizations, or changes in symptoms. Call us  before going to the ED for breathing or allergy symptoms since we might be able to fit you in for a sick visit. Feel free to contact us  anytime with any questions, problems, or concerns.  It was a pleasure to meet you today!  Websites that have reliable patient information: 1. American Academy of Asthma, Allergy, and Immunology: www.aaaai.org 2. Food Allergy Research and Education (FARE): foodallergy.org 3. Mothers of Asthmatics: http://www.asthmacommunitynetwork.org 4. American College of Allergy, Asthma, and Immunology: www.acaai.org      Like us  on [employer] and Instagram for our latest updates!      A healthy democracy works best when [employer] participate! Make sure you are registered to vote! If you have moved or changed any of your contact information, you will need to get this updated before voting! Scan the QR codes below to learn more!
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
## 2024-03-04 ENCOUNTER — Encounter: Payer: Self-pay | Admitting: Cardiology

## 2024-03-04 ENCOUNTER — Encounter: Payer: Self-pay | Admitting: Allergy & Immunology

## 2024-03-06 NOTE — Telephone Encounter (Signed)
 Closing encounter. Routed portal message from patient to provider

## 2024-03-13 ENCOUNTER — Telehealth: Payer: Self-pay

## 2024-03-13 NOTE — Telephone Encounter (Signed)
 Copied from CRM #[id number]. Topic: Referral - Request for Referral >> Feb 22, 2024 10:42 AM Katie Davis wrote: Did the patient discuss referral with their provider in the last year? Yes (If No - schedule appointment) (If Yes - send message)  Appointment offered? No, no response from provider  Type of order/referral and detailed reason for visit: Pulmonologist- found a nodule on lung- chest congestion   Preference of office, provider, location: [facility] - [facility]- Dr. Carolynne Allan   If referral order, have you been seen by this specialty before? Yes, has been seeing him for the past 3 years.  (If Yes, this issue or another issue? When? Where?  ** Has CT Scan of chest schd for this Thursday. Wants referral processed so pulmonologist could view her scan.   ** Phone call could expedite the process per pulmonologist office- [phone]  Can we respond through MyChart? Yes >> Mar 13, 2024  1:37 PM Katie Davis wrote: Patient called.. checking status of referral.. Pls call her back today and adv of update.   >> Feb 22, 2024 10:59 AM Katie Davis wrote: scheduled

## 2024-03-13 NOTE — Telephone Encounter (Signed)
 Referral sent

## 2024-03-14 ENCOUNTER — Telehealth: Payer: Self-pay

## 2024-03-14 ENCOUNTER — Telehealth: Payer: Self-pay | Admitting: Allergy & Immunology

## 2024-03-14 ENCOUNTER — Telehealth: Payer: Self-pay | Admitting: *Deleted

## 2024-03-14 NOTE — Telephone Encounter (Signed)
 Patient need copy of med list and allergy list will pick up tomorrow

## 2024-03-14 NOTE — Telephone Encounter (Signed)
 PT called stating that she received a call and was returning the call back. She would like to know what lab orders were sent and what they are for and also she can only go to [facility] in [place] and to please give her a call back and can also send MyChart messages.

## 2024-03-14 NOTE — Telephone Encounter (Signed)
 Patient called back and wanted to know what you were looking for with the additional labs. She stated that if it is something looking for something she is pre dis positioned to, she isn't sure that she wants to have the labs drawn and know. She did want to also let you know that her doctor put her on a round of Cipro  so she discontinued your antibiotics for now and will restart them in a few days now that she has finished the antibiotic.

## 2024-03-14 NOTE — Telephone Encounter (Signed)
-----   Message from Marty Shaggy, MD sent at 03/07/2024  1:00 PM EST ----- Clarification - I talked with Katie Davis and she said that she needs to come in anytime during the week, but if she comes on FRIDAY, it needs to be before 12:30 pm so it can be sent via [employer] before the weekend.   Our [facility] phlebotomist is here during the following hours:  - Monday: 9am to 12:30pm and 1:30pm to 5pm - Tuesday: 9am to 12:30pm and 1:30pm to 5:30pm - Wednesday: 9am to 12:30pm and 1:30pm to 5pm - Thursday: 9am to 12:30pm and 1:30pm to 5:30pm - Friday: 9am - 12:30pm and 1:30pm to 4:00pm

## 2024-03-14 NOTE — Telephone Encounter (Signed)
Called and left a voicemail asking for patient to return call to inform.  

## 2024-03-14 NOTE — Telephone Encounter (Signed)
Printed and at front

## 2024-03-16 ENCOUNTER — Encounter: Payer: Self-pay | Admitting: Cardiology

## 2024-03-16 ENCOUNTER — Other Ambulatory Visit: Payer: Self-pay | Admitting: Internal Medicine

## 2024-03-16 DIAGNOSIS — R198 Other specified symptoms and signs involving the digestive system and abdomen: Secondary | ICD-10-CM

## 2024-03-16 LAB — CMP14+EGFR
ALT: 14 IU/L (ref 0–32)
AST: 18 IU/L (ref 0–40)
Albumin: 4.4 g/dL (ref 3.9–4.9)
Alkaline Phosphatase: 73 IU/L (ref 49–135)
BUN/Creatinine Ratio: 33 — ABNORMAL HIGH (ref 12–28)
BUN: 18 mg/dL (ref 8–27)
Bilirubin Total: 0.4 mg/dL (ref 0.0–1.2)
CO2: 22 mmol/L (ref 20–29)
Calcium: 9.3 mg/dL (ref 8.7–10.3)
Chloride: 103 mmol/L (ref 96–106)
Creatinine, Ser: 0.55 mg/dL — ABNORMAL LOW (ref 0.57–1.00)
Globulin, Total: 2.6 g/dL (ref 1.5–4.5)
Glucose: 90 mg/dL (ref 70–99)
Potassium: 4 mmol/L (ref 3.5–5.2)
Sodium: 138 mmol/L (ref 134–144)
Total Protein: 7 g/dL (ref 6.0–8.5)
eGFR: 100 mL/min/1.73

## 2024-03-16 LAB — LYMPH ENUMERATION, BASIC & NK CELLS
% CD 3 Pos. Lymph.: 78.6 % (ref 57.5–86.2)
% CD 4 Pos. Lymph.: 58.2 % (ref 30.8–58.5)
% NK (CD56/16): 9.1 % (ref 1.4–19.4)
Ab NK (CD56/16): 109 /uL (ref 24–406)
Absolute CD 3: 943 /uL (ref 622–2402)
Absolute CD 4 Helper: 698 /uL (ref 359–1519)
Basophils Absolute: 0 x10E3/uL (ref 0.0–0.2)
Basos: 0 %
CD19 % B Cell: 10.4 % (ref 3.3–25.4)
CD19 Abs: 125 /uL (ref 12–645)
CD4/CD8 Ratio: 2.75 (ref 0.92–3.72)
CD8 % Suppressor T Cell: 21.2 % (ref 12.0–35.5)
CD8 T Cell Abs: 254 /uL (ref 109–897)
EOS (ABSOLUTE): 0.1 x10E3/uL (ref 0.0–0.4)
Eos: 2 %
Hematocrit: 41.4 % (ref 34.0–46.6)
Hemoglobin: 13.7 g/dL (ref 11.1–15.9)
Immature Grans (Abs): 0 x10E3/uL (ref 0.0–0.1)
Immature Granulocytes: 0 %
Lymphocytes Absolute: 1.2 x10E3/uL (ref 0.7–3.1)
Lymphs: 25 %
MCH: 31.7 pg (ref 26.6–33.0)
MCHC: 33.1 g/dL (ref 31.5–35.7)
MCV: 96 fL (ref 79–97)
Monocytes Absolute: 0.3 x10E3/uL (ref 0.1–0.9)
Monocytes: 6 %
Neutrophils Absolute: 3.3 x10E3/uL (ref 1.4–7.0)
Neutrophils: 67 %
Platelets: 244 x10E3/uL (ref 150–450)
RBC: 4.32 x10E6/uL (ref 3.77–5.28)
RDW: 13.3 % (ref 11.7–15.4)
WBC: 5 x10E3/uL (ref 3.4–10.8)

## 2024-03-16 LAB — CBC WITH DIFFERENTIAL/PLATELET
Basophils Absolute: 0 x10E3/uL (ref 0.0–0.2)
Basos: 1 %
EOS (ABSOLUTE): 0.1 x10E3/uL (ref 0.0–0.4)
Eos: 2 %
Hematocrit: 40.4 % (ref 34.0–46.6)
Hemoglobin: 13.7 g/dL (ref 11.1–15.9)
Immature Grans (Abs): 0 x10E3/uL (ref 0.0–0.1)
Immature Granulocytes: 0 %
Lymphocytes Absolute: 1.3 x10E3/uL (ref 0.7–3.1)
Lymphs: 25 %
MCH: 32.4 pg (ref 26.6–33.0)
MCHC: 33.9 g/dL (ref 31.5–35.7)
MCV: 96 fL (ref 79–97)
Monocytes Absolute: 0.3 x10E3/uL (ref 0.1–0.9)
Monocytes: 6 %
Neutrophils Absolute: 3.4 x10E3/uL (ref 1.4–7.0)
Neutrophils: 66 %
Platelets: 248 x10E3/uL (ref 150–450)
RBC: 4.23 x10E6/uL (ref 3.77–5.28)
RDW: 13.1 % (ref 11.7–15.4)
WBC: 5 x10E3/uL (ref 3.4–10.8)

## 2024-03-16 LAB — LIPID PANEL
Chol/HDL Ratio: 5.1 ratio — ABNORMAL HIGH (ref 0.0–4.4)
Cholesterol, Total: 215 mg/dL — ABNORMAL HIGH (ref 100–199)
HDL: 42 mg/dL
LDL Chol Calc (NIH): 136 mg/dL — ABNORMAL HIGH (ref 0–99)
Triglycerides: 207 mg/dL — ABNORMAL HIGH (ref 0–149)
VLDL Cholesterol Cal: 37 mg/dL (ref 5–40)

## 2024-03-16 LAB — HEMOGLOBIN A1C
Est. average glucose Bld gHb Est-mCnc: 111 mg/dL
Hgb A1c MFr Bld: 5.5 % (ref 4.8–5.6)

## 2024-03-16 LAB — T-HELPER CELLS CD4/CD8 %
% CD 4 Pos. Lymph.: 56.2 % (ref 30.8–58.5)
Absolute CD 4 Helper: 674 /uL (ref 359–1519)
CD3+CD4+ Cells/CD3+CD8+ Cells Bld: 2.52 (ref 0.92–3.72)
CD3+CD8+ Cells # Bld: 268 /uL (ref 109–897)
CD3+CD8+ Cells NFr Bld: 22.3 % (ref 12.0–35.5)

## 2024-03-16 LAB — B12 AND FOLATE PANEL
Folate: 14.9 ng/mL
Vitamin B-12: 597 pg/mL (ref 232–1245)

## 2024-03-16 LAB — TSH+T4F+T3FREE
Free T4: 1.49 ng/dL (ref 0.82–1.77)
T3, Free: 2.5 pg/mL (ref 2.0–4.4)
TSH: 1.54 u[IU]/mL (ref 0.450–4.500)

## 2024-03-16 LAB — VITAMIN D 25 HYDROXY (VIT D DEFICIENCY, FRACTURES): Vit D, 25-Hydroxy: 40.8 ng/mL (ref 30.0–100.0)

## 2024-03-16 NOTE — Telephone Encounter (Signed)
 We talked via MyChart and figured this out. Thanks!

## 2024-03-17 ENCOUNTER — Ambulatory Visit: Payer: Self-pay | Admitting: Allergy & Immunology

## 2024-03-22 ENCOUNTER — Other Ambulatory Visit: Payer: Self-pay

## 2024-03-23 ENCOUNTER — Ambulatory Visit (INDEPENDENT_AMBULATORY_CARE_PROVIDER_SITE_OTHER): Payer: Medicare (Managed Care) | Admitting: Internal Medicine

## 2024-03-23 ENCOUNTER — Encounter: Payer: Self-pay | Admitting: Internal Medicine

## 2024-03-23 VITALS — BP 133/75 | HR 72 | Temp 97.5°F | Ht 63.0 in | Wt 161.7 lb

## 2024-03-23 DIAGNOSIS — Z8719 Personal history of other diseases of the digestive system: Secondary | ICD-10-CM

## 2024-03-23 DIAGNOSIS — K581 Irritable bowel syndrome with constipation: Secondary | ICD-10-CM

## 2024-03-23 DIAGNOSIS — K219 Gastro-esophageal reflux disease without esophagitis: Secondary | ICD-10-CM

## 2024-03-23 DIAGNOSIS — K900 Celiac disease: Secondary | ICD-10-CM

## 2024-03-23 DIAGNOSIS — Z1211 Encounter for screening for malignant neoplasm of colon: Secondary | ICD-10-CM

## 2024-03-23 NOTE — Telephone Encounter (Signed)
 Received a fax from [facility] asking for office notes in order to proceef with the health plan wanting to verify coverage criteria for the genetic testing. I did not see anything mentioned in the last office note. Is it in there and I'm just not seeing it?

## 2024-03-23 NOTE — Patient Instructions (Signed)
 I am going to order blood work today at [facility] to check your celiac antibody levels.  I want you to trial of MiraLAX 1-2 capfuls daily.  Call with update in 1 to 2 weeks and if not improved I will send in Amitiza 8 mcg twice daily.  We will hold off on EGD for now.  Follow-up in 3 months or sooner if needed.  It was very nice seeing you again today.  Dr. Cindie

## 2024-03-23 NOTE — Progress Notes (Signed)
 "   Referring Provider: Bevely Doffing, FNP Primary Care Physician:  Bevely Doffing, FNP Primary GI:  Dr. Cindie  Chief Complaint  Patient presents with   Follow-up    Patient here today to discuss her recent ct scans, which found several spots on her lung, she says she in of egd due to history IBS, and diverticulosis,celiac.  Patient still has issues with constipation and on lizess, but this cost too much and would like an alternative.     HPI:   Katie Davis is a 69 y.o. female who presents to the clinic today for follow-up visit.  Chronic GERD: well controlled on pantoprazole  40 mg daily. No dysphagia/odynophagia. No epigastric pain.   IBS with constipation: Currently taking Linzess  290 mcg daily. Causing significant diarrhea and too expensive at this point, requesting alternative.   Celiac disease: controlled, adheres to a strict gluten free diet. TTG testing 12/05/20 WNL. Was scheduled for EGD August 2023 but cancelled due to being sick with pneumonia.   Colon cancer screening:  Colonoscopy up-to-date March 2022 revealing diverticulosis and nonbleeding internal hemorrhoids, due for repeat in 2032.   Past Medical History:  Diagnosis Date   Abnormal glandular Papanicolaou smear of cervix    Allergy 2008   Latex ..after surgery   Anxiety    Asthma    Body mass index 30.0-30.9, adult    Celiac disease    Chronic vascular disorder of intestine    Encounter for general adult medical examination with abnormal findings 05/07/2020   Fibromyalgia    GERD (gastroesophageal reflux disease) 14 years ago   Graves disease    Headache    Migrains   Hyperlipidemia    Hypothyroidism    IBS (irritable bowel syndrome) With constipation    ILD (interstitial lung disease) (HCC)    Lupus (systemic lupus erythematosus) (HCC)    Obesity, unspecified    Osteoporosis    Raynaud disease    Sjogren's disease    Type 2 diabetes mellitus without complications (HCC) 02/25/2022    Past  Surgical History:  Procedure Laterality Date   APPENDECTOMY     CHOLECYSTECTOMY     COLONOSCOPY WITH PROPOFOL  N/A 06/10/2020   Surgeon: Cindie Carlin POUR, DO;  diverticulosis and nonbleeding internal hemorrhoids, due for repeat in 2032.   DG THUMB RIGHT HAND ([facility] HX)     EYE SURGERY  2019   Cataract. Right eye   little finger Right    placement of pin   OTHER SURGICAL HISTORY     biopsies on both feet   REDUCTION MAMMAPLASTY      Current Outpatient Medications  Medication Sig Dispense Refill   acidophilus (RISAQUAD) CAPS capsule Take 1 capsule by mouth daily.     albuterol  (PROAIR  HFA) 108 (90 Base) MCG/ACT inhaler 1-2 puffs every 4 hours as needed only  if your can't catch your breath 18 g 1   aspirin  EC 81 MG tablet Take 1 tablet (81 mg total) by mouth daily. Swallow whole. 90 tablet 3   atenolol  (TENORMIN ) 50 MG tablet Take 1 tablet by mouth twice daily 180 tablet 0   CALCIUM  CARBONATE ANTACID PO Take 2 tablets by mouth daily.     Cholecalciferol (VITAMIN D3 GUMMIES PO) Take 6,000 Units by mouth daily. 2000 units     clonazePAM  (KLONOPIN ) 0.5 MG tablet Take 1 tablet (0.5 mg total) by mouth 2 (two) times daily as needed. for anxiety 60 tablet 0   ELDERBERRY PO Take 200 mg by mouth  daily. 100 mg     fluticasone  (FLONASE ) 50 MCG/ACT nasal spray Place 1 spray into both nostrils daily as needed (Sinus). 9.9 mL 0   fluticasone  furoate-vilanterol (BREO ELLIPTA ) 200-25 MCG/ACT AEPB Inhale 1 puff into the lungs daily.     ibuprofen  (ADVIL ) 800 MG tablet TAKE 1 TABLET BY MOUTH EVERY 8 HOURS AS NEEDED FOR MODERATE PAIN (Patient taking differently: as needed.) 30 tablet 0   levalbuterol  (XOPENEX  HFA) 45 MCG/ACT inhaler Inhale 2 puffs into the lungs every 4 (four) hours as needed for wheezing. 1 each 2   linaclotide  (LINZESS ) 290 MCG CAPS capsule Take 1 capsule (290 mcg total) by mouth daily before breakfast. 30 capsule 5   Omega-3 Fatty Acids (FISH OIL) 1000 MG CAPS Take 2,000 mg by mouth  daily.     ondansetron  (ZOFRAN ) 4 MG tablet TAKE 1 TABLET BY MOUTH EVERY 8 HOURS AS NEEDED 30 tablet 0   pantoprazole  (PROTONIX ) 40 MG tablet TAKE 1 TABLET BY MOUTH ONCE DAILY BEFORE BREAKFAST 90 tablet 1   Perfluorohexyloctane  (MIEBO ) 1.338 GM/ML SOLN Apply 1 Application to eye as needed. 3 mL 0   Polyethyl Glyc-Propyl Glyc PF (SYSTANE HYDRATION PF) 0.4-0.3 % SOLN Place 1 drop into both eyes daily as needed (Dry eye).     rosuvastatin  (CRESTOR ) 5 MG tablet Take 1 tablet (5 mg total) by mouth every Monday, Wednesday, and Friday. 90 tablet 1   SYNTHROID  112 MCG tablet Take 1 tablet (112 mcg total) by mouth See admin instructions. Alternates every other day with 100 mcg - Brand Name (Patient taking differently: Take 112 mcg by mouth daily at 6 (six) AM.) 90 tablet 1   azithromycin  (ZITHROMAX ) 500 MG tablet Take 1 tablet (500 mg total) by mouth every Monday, Wednesday, and Friday. (Patient not taking: Reported on 03/23/2024) 36 tablet 1   levalbuterol  (XOPENEX ) 0.63 MG/3ML nebulizer solution Take 3 mLs (0.63 mg total) by nebulization once for 1 dose. (Patient not taking: Reported on 03/23/2024) 3 mL 3   No current facility-administered medications for this visit.    Allergies as of 03/23/2024 - Review Complete 03/23/2024  Allergen Reaction Noted   Prednisone Palpitations 12/17/2020   Bactrim [sulfamethoxazole-trimethoprim] Nausea And Vomiting 11/16/2018   Codeine Nausea And Vomiting 02/21/2021   Denosumab   02/21/2021   Gluten meal Other (See Comments) 11/16/2018   Influenza vaccines Swelling 02/19/2022   Keflex [cephalexin] Nausea And Vomiting 12/09/2018   Levaquin [levofloxacin] Nausea And Vomiting 11/16/2018   Sulfa antibiotics Nausea And Vomiting 11/16/2018   Contrast media [iodinated contrast media] Nausea And Vomiting and Rash 11/16/2018   Doxycycline Palpitations 05/07/2020   Latex Hives and Rash 11/16/2018    Family History  Problem Relation Age of Onset   Congestive Heart Failure  Mother    Arthritis Mother    Aneurysm Father    Celiac disease Brother    Melanoma Brother    Aneurysm Brother        stomach   Asthma Brother    Cancer Brother    Stroke Sister    Celiac disease Sister    Irritable bowel syndrome Sister    Cancer Sister    Celiac disease Brother    Aneurysm Sister    Other Sister        multiple back surgeries   Colon cancer Neg Hx     Social History   Socioeconomic History   Marital status: Married    Spouse name: Not on file   Number of children: Not  on file   Years of education: Not on file   Highest education level: Some college, no degree  Occupational History   Not on file  Tobacco Use   Smoking status: Never    Passive exposure: Never   Smokeless tobacco: Never  Vaping Use   Vaping status: Never Used  Substance and Sexual Activity   Alcohol use: Not Currently    Comment: wine occ   Drug use: Never   Sexual activity: Not Currently    Birth control/protection: Abstinence, Post-menopausal, None  Other Topics Concern   Not on file  Social History Narrative   Not on file   Social Drivers of Health   Tobacco Use: Low Risk (03/03/2024)   Patient History    Smoking Tobacco Use: Never    Smokeless Tobacco Use: Never    Passive Exposure: Never  Financial Resource Strain: Low Risk (01/17/2024)   Overall Financial Resource Strain (CARDIA)    Difficulty of Paying Living Expenses: Not very hard  Food Insecurity: No Food Insecurity (01/17/2024)   Epic    Worried About Programme Researcher, Broadcasting/film/video in the Last Year: Never true    Ran Out of Food in the Last Year: Never true  Transportation Needs: No Transportation Needs (01/17/2024)   Epic    Lack of Transportation (Medical): No    Lack of Transportation (Non-Medical): No  Physical Activity: Insufficiently Active (01/17/2024)   Exercise Vital Sign    Days of Exercise per Week: 3 days    Minutes of Exercise per Session: 40 min  Stress: Stress Concern Present (01/17/2024)   [employer] - Occupational Stress Questionnaire    Feeling of Stress: To some extent  Social Connections: Moderately Isolated (01/17/2024)   Social Connection and Isolation Panel    Frequency of Communication with Friends and Family: More than three times a week    Frequency of Social Gatherings with Friends and Family: Patient declined    Attends Religious Services: Patient declined    Active Member of Clubs or Organizations: No    Attends Engineer, Structural: Not on file    Marital Status: Married  Depression (PHQ2-9): Low Risk (10/07/2023)   Depression (PHQ2-9)    PHQ-2 Score: 3  Alcohol Screen: Low Risk (10/07/2023)   Alcohol Screen    Last Alcohol Screening Score (AUDIT): 0  Housing: Unknown (01/17/2024)   Epic    Unable to Pay for Housing in the Last Year: No    Number of Times Moved in the Last Year: Not on file    Homeless in the Last Year: No  Utilities: Not At Risk (07/30/2022)   [employer] Utilities    Threatened with loss of utilities: No  Health Literacy: Not on file    Subjective: Review of Systems  Constitutional:  Negative for chills and fever.  HENT:  Negative for congestion and hearing loss.   Eyes:  Negative for blurred vision and double vision.  Respiratory:  Negative for cough and shortness of breath.   Cardiovascular:  Negative for chest pain and palpitations.  Gastrointestinal:  Positive for constipation and heartburn. Negative for abdominal pain, blood in stool, diarrhea, melena and vomiting.  Genitourinary:  Negative for dysuria and urgency.  Musculoskeletal:  Negative for joint pain and myalgias.  Skin:  Negative for itching and rash.  Neurological:  Negative for dizziness and headaches.  Psychiatric/Behavioral:  Negative for depression. The patient is not nervous/anxious.      Objective: BP 133/75 (BP  Location: Left Arm, Patient Position: Sitting, Cuff Size: Normal)   Pulse 72   Temp (!) 97.5 F (36.4 C) (Temporal)   Ht 5'  3 (1.6 m)   Wt 161 lb 11.2 oz (73.3 kg)   BMI 28.64 kg/m  Physical Exam Constitutional:      Appearance: Normal appearance.  HENT:     Head: Normocephalic and atraumatic.  Eyes:     Extraocular Movements: Extraocular movements intact.     Conjunctiva/sclera: Conjunctivae normal.  Cardiovascular:     Rate and Rhythm: Normal rate and regular rhythm.  Pulmonary:     Effort: Pulmonary effort is normal.     Breath sounds: Normal breath sounds.  Abdominal:     General: Bowel sounds are normal.     Palpations: Abdomen is soft.  Musculoskeletal:        General: No swelling. Normal range of motion.     Cervical back: Normal range of motion and neck supple.  Skin:    General: Skin is warm and dry.     Coloration: Skin is not jaundiced.  Neurological:     General: No focal deficit present.     Mental Status: She is alert and oriented to person, place, and time.  Psychiatric:        Mood and Affect: Mood normal.        Behavior: Behavior normal.      Assessment/Plan:  1.  Chronic GERD: well controlled on pantoprazole  40 mg daily.  Will continue.  2.  IBS with constipation: Currently taking Linzess  290 mcg daily. Causing significant diarrhea and too expensive at this point, requesting alternative. Will start miralax 1-2 capfuls daily. If not improved in 1-2 weeks will trial on Amitiza 8mcg BID.   3.  Celiac disease: controlled, adheres to a strict gluten free diet. TTG testing 12/05/20 WNL. Was scheduled for EGD August 2023 but cancelled due to being sick with pneumonia.  Will update TTG antibodies today.  Needs surveillance EGD though would like to hold off for now given all her lung issues at present, which is reasonable.  Discuss on follow-up visit  4.  Colon cancer screening:  Colonoscopy up-to-date March 2022 revealing diverticulosis and nonbleeding internal hemorrhoids, due for repeat in 2032.  Follow-up in 3 months.   03/23/2024 10:12 AM  "

## 2024-03-24 NOTE — Telephone Encounter (Signed)
 I addended the plan to reflect the need for genetic testing.   Marty Shaggy, MD [facility] 

## 2024-03-27 NOTE — Telephone Encounter (Signed)
 Office notes have been faxed to [phone] to the attention of Cassandra/ Medical Records.

## 2024-03-28 ENCOUNTER — Ambulatory Visit: Admitting: Gastroenterology

## 2024-03-30 ENCOUNTER — Ambulatory Visit: Payer: Medicare (Managed Care) | Admitting: Physician Assistant

## 2024-03-30 ENCOUNTER — Encounter: Payer: Self-pay | Admitting: Physician Assistant

## 2024-03-30 VITALS — BP 108/74 | HR 66 | Temp 97.8°F | Ht 63.0 in | Wt 160.0 lb

## 2024-03-30 DIAGNOSIS — K900 Celiac disease: Secondary | ICD-10-CM | POA: Diagnosis not present

## 2024-03-30 DIAGNOSIS — E890 Postprocedural hypothyroidism: Secondary | ICD-10-CM | POA: Diagnosis not present

## 2024-03-30 DIAGNOSIS — M350C Sjogren syndrome with dental involvement: Secondary | ICD-10-CM

## 2024-03-30 DIAGNOSIS — K581 Irritable bowel syndrome with constipation: Secondary | ICD-10-CM | POA: Diagnosis not present

## 2024-03-30 DIAGNOSIS — M329 Systemic lupus erythematosus, unspecified: Secondary | ICD-10-CM

## 2024-03-30 DIAGNOSIS — R918 Other nonspecific abnormal finding of lung field: Secondary | ICD-10-CM | POA: Diagnosis not present

## 2024-03-30 NOTE — Progress Notes (Signed)
 "  New Patient Office Visit  Subjective    Patient ID: Katie Davis, female    DOB: November 07, 1955  Age: 69 y.o. MRN: [id number]  CC:  Chief Complaint  Patient presents with   New Patient (Initial Visit)    Patient is here as a new patient   Patient is here for a lot of concerns for DR.     HPI Katie Davis presents to establish care  Discussed the use of AI scribe software for clinical note transcription with the patient, who gave verbal consent to proceed.  History of Present Illness Katie Davis is a 69 year old female with a lengthy medical history who presents with ongoing lung issues.  She reports she has experienced multiple COVID-19 infections, resulting in symptoms/complications described as a 'COVID rebound' from medication. December 2025 it was noted that she had abnormalities in bilateral lungs concerning for infection vs malignancy. Work up is ongoing and she is followed by pulmonology.   Her medical history is complex, including Graves' disease, Sjogren's syndrome, Raynaud's phenomenon, irritable bowel syndrome (IBS), fibromyalgia, diverticulosis, mesenteric artery stenosis, and past skin cancer. She also has a history of HPV but recent Pap was within normal limits. Her thyroid  levels have recently stabilized, but she experiences symptoms such as heart racing and headaches when levels are not optimal. She has a history of celiac disease, which was undiagnosed for ten years, leading to significant weight loss and health issues before diagnosis.  She is concerned about the impact of azithromycin  on her gut health. She takes probiotics and follows a Mediterranean diet to support her health. She has been managing her weight, currently at 160 pounds, down from 178 pounds, and is trying to maintain a healthy diet despite stress and caregiving responsibilities. Her husband has a history of two strokes in 2021, resulting in peripheral vision loss and short-term memory issues.  He also experienced a pit bull attack in 2024, leading to herniated discs and subsequent surgery. He now exhibits symptoms of early-onset dementia, including nighttime agitation and memory lapses. She manages his care and reports significant stress from these responsibilities. She has a supportive network of friends and family, primarily in [place], and maintains communication with them. She is exploring therapy options to manage stress and emotional well-being.   She does not have current complaints aside from establishing care and denies need for refills at this time.     Outpatient Encounter Medications as of 03/30/2024  Medication Sig   acidophilus (RISAQUAD) CAPS capsule Take 1 capsule by mouth daily.   albuterol  (PROAIR  HFA) 108 (90 Base) MCG/ACT inhaler 1-2 puffs every 4 hours as needed only  if your can't catch your breath   aspirin  EC 81 MG tablet Take 1 tablet (81 mg total) by mouth daily. Swallow whole.   atenolol  (TENORMIN ) 50 MG tablet Take 1 tablet by mouth twice daily   azithromycin  (ZITHROMAX ) 500 MG tablet Take 1 tablet (500 mg total) by mouth every Monday, Wednesday, and Friday.   CALCIUM  CARBONATE ANTACID PO Take 2 tablets by mouth daily.   Cholecalciferol (VITAMIN D3 GUMMIES PO) Take 6,000 Units by mouth daily. 2000 units   clonazePAM  (KLONOPIN ) 0.5 MG tablet Take 1 tablet (0.5 mg total) by mouth 2 (two) times daily as needed. for anxiety   ELDERBERRY PO Take 200 mg by mouth daily. 100 mg   fluticasone  (FLONASE ) 50 MCG/ACT nasal spray Place 1 spray into both nostrils daily as needed (Sinus).   fluticasone  furoate-vilanterol (BREO  ELLIPTA) 200-25 MCG/ACT AEPB Inhale 1 puff into the lungs daily.   ibuprofen  (ADVIL ) 800 MG tablet TAKE 1 TABLET BY MOUTH EVERY 8 HOURS AS NEEDED FOR MODERATE PAIN (Patient taking differently: as needed.)   levalbuterol  (XOPENEX  HFA) 45 MCG/ACT inhaler Inhale 2 puffs into the lungs every 4 (four) hours as needed for wheezing.   levalbuterol   (XOPENEX ) 0.63 MG/3ML nebulizer solution Take 3 mLs (0.63 mg total) by nebulization once for 1 dose.   linaclotide  (LINZESS ) 290 MCG CAPS capsule Take 1 capsule (290 mcg total) by mouth daily before breakfast.   Omega-3 Fatty Acids (FISH OIL) 1000 MG CAPS Take 2,000 mg by mouth daily.   ondansetron  (ZOFRAN ) 4 MG tablet TAKE 1 TABLET BY MOUTH EVERY 8 HOURS AS NEEDED   pantoprazole  (PROTONIX ) 40 MG tablet TAKE 1 TABLET BY MOUTH ONCE DAILY BEFORE BREAKFAST   Perfluorohexyloctane  (MIEBO ) 1.338 GM/ML SOLN Apply 1 Application to eye as needed.   Polyethyl Glyc-Propyl Glyc PF (SYSTANE HYDRATION PF) 0.4-0.3 % SOLN Place 1 drop into both eyes daily as needed (Dry eye).   rosuvastatin  (CRESTOR ) 5 MG tablet Take 1 tablet (5 mg total) by mouth every Monday, Wednesday, and Friday.   SYNTHROID  112 MCG tablet Take 1 tablet (112 mcg total) by mouth See admin instructions. Alternates every other day with 100 mcg - Brand Name (Patient taking differently: Take 112 mcg by mouth daily at 6 (six) AM.)   No facility-administered encounter medications on file as of 03/30/2024.    Past Medical History:  Diagnosis Date   Abnormal glandular Papanicolaou smear of cervix    Allergy 2008   Latex ..after surgery   Anxiety    Asthma    Body mass index 30.0-30.9, adult    Celiac disease    Chronic vascular disorder of intestine    Encounter for general adult medical examination with abnormal findings 05/07/2020   Fibromyalgia    GERD (gastroesophageal reflux disease) 14 years ago   Graves disease    Headache    Migrains   Hyperlipidemia    Hypothyroidism    IBS (irritable bowel syndrome) With constipation    ILD (interstitial lung disease) (HCC)    Lupus (systemic lupus erythematosus) (HCC)    Obesity, unspecified    Osteoporosis    Raynaud disease    Sjogren's disease    Type 2 diabetes mellitus without complications (HCC) 02/25/2022    Past Surgical History:  Procedure Laterality Date   APPENDECTOMY      CHOLECYSTECTOMY     COLONOSCOPY WITH PROPOFOL  N/A 06/10/2020   Surgeon: Cindie Carlin POUR, DO;  diverticulosis and nonbleeding internal hemorrhoids, due for repeat in 2032.   DG THUMB RIGHT HAND ([facility] HX)     EYE SURGERY  2019   Cataract. Right eye   little finger Right    placement of pin   OTHER SURGICAL HISTORY     biopsies on both feet   REDUCTION MAMMAPLASTY      Family History  Problem Relation Age of Onset   Congestive Heart Failure Mother    Arthritis Mother    Aneurysm Father    Celiac disease Brother    Melanoma Brother    Aneurysm Brother        stomach   Asthma Brother    Cancer Brother    Stroke Sister    Celiac disease Sister    Irritable bowel syndrome Sister    Cancer Sister    Celiac disease Brother    Aneurysm  Sister    Other Sister        multiple back surgeries   Colon cancer Neg Hx     Social History   Socioeconomic History   Marital status: Married    Spouse name: Not on file   Number of children: Not on file   Years of education: Not on file   Highest education level: Some college, no degree  Occupational History   Not on file  Tobacco Use   Smoking status: Never    Passive exposure: Never   Smokeless tobacco: Never  Vaping Use   Vaping status: Never Used  Substance and Sexual Activity   Alcohol use: Not Currently    Comment: wine occ   Drug use: Never   Sexual activity: Not Currently    Birth control/protection: Abstinence, Post-menopausal, None  Other Topics Concern   Not on file  Social History Narrative   Not on file   Social Drivers of Health   Tobacco Use: Low Risk (03/03/2024)   Patient History    Smoking Tobacco Use: Never    Smokeless Tobacco Use: Never    Passive Exposure: Never  Financial Resource Strain: Low Risk (01/17/2024)   Overall Financial Resource Strain (CARDIA)    Difficulty of Paying Living Expenses: Not very hard  Food Insecurity: No Food Insecurity (01/17/2024)   Epic    Worried About Brewing Technologist in the Last Year: Never true    Ran Out of Food in the Last Year: Never true  Transportation Needs: No Transportation Needs (01/17/2024)   Epic    Lack of Transportation (Medical): No    Lack of Transportation (Non-Medical): No  Physical Activity: Insufficiently Active (01/17/2024)   Exercise Vital Sign    Days of Exercise per Week: 3 days    Minutes of Exercise per Session: 40 min  Stress: Stress Concern Present (01/17/2024)   [employer] of Occupational Health - Occupational Stress Questionnaire    Feeling of Stress: To some extent  Social Connections: Moderately Isolated (01/17/2024)   Social Connection and Isolation Panel    Frequency of Communication with Friends and Family: More than three times a week    Frequency of Social Gatherings with Friends and Family: Patient declined    Attends Religious Services: Patient declined    Active Member of Clubs or Organizations: No    Attends Engineer, Structural: Not on file    Marital Status: Married  Catering Manager Violence: Not At Risk (10/07/2023)   Epic    Fear of Current or Ex-Partner: No    Emotionally Abused: No    Physically Abused: No    Sexually Abused: No  Depression (PHQ2-9): Low Risk (03/30/2024)   Depression (PHQ2-9)    PHQ-2 Score: 2  Alcohol Screen: Low Risk (10/07/2023)   Alcohol Screen    Last Alcohol Screening Score (AUDIT): 0  Housing: Unknown (01/17/2024)   Epic    Unable to Pay for Housing in the Last Year: No    Number of Times Moved in the Last Year: Not on file    Homeless in the Last Year: No  Utilities: Not At Risk (07/30/2022)   [facility] Utilities    Threatened with loss of utilities: No  Health Literacy: Not on file    Review of Systems  Constitutional:  Positive for appetite change. Negative for activity change, fatigue and fever.  Eyes:  Negative for visual disturbance.  Respiratory:  Negative for cough and shortness of breath.  Cardiovascular:  Negative for chest pain.   Gastrointestinal:  Negative for constipation, diarrhea and vomiting.  Neurological:  Negative for light-headedness and headaches.  Psychiatric/Behavioral:  Positive for sleep disturbance. Negative for agitation and decreased concentration. The patient is not nervous/anxious.        Stress      Objective    BP 108/74 (BP Location: Left Arm, Patient Position: Sitting)   Pulse 66   Temp 97.8 F (36.6 C)   Ht 5' 3 (1.6 m)   Wt 160 lb (72.6 kg)   SpO2 99%   BMI 28.34 kg/m   Physical Exam Constitutional:      General: She is not in acute distress.    Appearance: Normal appearance. She is not ill-appearing.  HENT:     Head: Normocephalic and atraumatic.     Mouth/Throat:     Mouth: Mucous membranes are moist.     Pharynx: Oropharynx is clear.  Eyes:     Extraocular Movements: Extraocular movements intact.     Conjunctiva/sclera: Conjunctivae normal.  Cardiovascular:     Rate and Rhythm: Normal rate and regular rhythm.     Heart sounds: Normal heart sounds. No murmur heard. Pulmonary:     Effort: Pulmonary effort is normal.     Breath sounds: Normal breath sounds. No wheezing, rhonchi or rales.  Musculoskeletal:     Right lower leg: No edema.     Left lower leg: No edema.  Skin:    General: Skin is warm and dry.  Neurological:     General: No focal deficit present.     Mental Status: She is alert and oriented to person, place, and time.  Psychiatric:        Mood and Affect: Mood normal.        Behavior: Behavior normal.        Assessment & Plan:  Ground glass opacity present on imaging of lung Assessment & Plan: Pulmonary nodules and ground glass opacities identified on CT scan with differential diagnosis including infection versus malignancy. Recently finished high dose antibiotic and is currently taking azithromycin  3 times a week. Follow-up CT scan scheduled for February 6th to assess changes in lung findings. She relates plan as follows- if nodules persist  without growth, will consider waiting another three months, i growth is observed, will consider biopsy. - Continue plan as directed by pulmonology.  - Proceed with follow-up CT scan for February 6th. - Follow up for new chest pain, shortness of breath, cough, or fever.   Celiac disease Assessment & Plan: Well-managed with dietary adherence. Recent blood work ordered by GI to monitor celiac status. - Continue gluten-free diet. - Await results of recent blood work to monitor celiac status and follow up with GI as scheduled.    Postoperative hypothyroidism Assessment & Plan: Thyroid  levels are well-managed. Reports previous symptoms of hyperthyroidism and hypothyroidism with varying TSH levels. - Continue current thyroid  management.   Sjogren syndrome with dental involvement Assessment & Plan: Severe Sjogren's syndrome with significant dryness affecting eyes and oral health. Reports cracked teeth and severe dryness of eyes. - Continue current management and monitor symptoms.   History of systemic lupus erythematosus (SLE) (HCC) Assessment & Plan: Chronic immunodeficiency with frequent infections. Current treatment with azithromycin  three times a week is not preferred due to potential gut damage. Considering immunoglobulin infusions to improve immune function without affecting gut health. - Follow up with allergy/immunology to discuss immunoglobulin infusions with Dr. Iva at the end of the month. -  Continue azithromycin  three times a week until further discussion with specialist.    Irritable bowel syndrome with constipation Assessment & Plan: IBS with constipation. Previous use of Linzess  discontinued due to cost. Currently using Miralax twice daily. - Continue Miralax twice daily. - Follow up with GI in April to discuss further management.     I personally spent a total of 55 minutes in the care of the patient today including preparing to see the patient,  getting/reviewing separately obtained history, performing a medically appropriate exam/evaluation, counseling and educating, and documenting clinical information in the EHR.   Return in about 3 months (around 06/28/2024) for labs.   Cletus Paris, PA-C   "

## 2024-03-31 ENCOUNTER — Encounter: Payer: Self-pay | Admitting: Physician Assistant

## 2024-03-31 ENCOUNTER — Encounter: Payer: Self-pay | Admitting: Cardiology

## 2024-03-31 NOTE — Assessment & Plan Note (Signed)
 Severe Sjogren's syndrome with significant dryness affecting eyes and oral health. Reports cracked teeth and severe dryness of eyes. - Continue current management and monitor symptoms.

## 2024-03-31 NOTE — Assessment & Plan Note (Signed)
 Thyroid  levels are well-managed. Reports previous symptoms of hyperthyroidism and hypothyroidism with varying TSH levels. - Continue current thyroid  management.

## 2024-03-31 NOTE — Assessment & Plan Note (Signed)
 Well-managed with dietary adherence. Recent blood work ordered by GI to monitor celiac status. - Continue gluten-free diet. - Await results of recent blood work to monitor celiac status and follow up with GI as scheduled.

## 2024-03-31 NOTE — Assessment & Plan Note (Signed)
 Chronic immunodeficiency with frequent infections. Current treatment with azithromycin  three times a week is not preferred due to potential gut damage. Considering immunoglobulin infusions to improve immune function without affecting gut health. - Follow up with allergy/immunology to discuss immunoglobulin infusions with Dr. Iva at the end of the month. - Continue azithromycin  three times a week until further discussion with specialist.

## 2024-03-31 NOTE — Assessment & Plan Note (Signed)
 IBS with constipation. Previous use of Linzess  discontinued due to cost. Currently using Miralax twice daily. - Continue Miralax twice daily. - Follow up with GI in April to discuss further management.

## 2024-03-31 NOTE — Assessment & Plan Note (Signed)
 Pulmonary nodules and ground glass opacities identified on CT scan with differential diagnosis including infection versus malignancy. Recently finished high dose antibiotic and is currently taking azithromycin  3 times a week. Follow-up CT scan scheduled for February 6th to assess changes in lung findings. She relates plan as follows- if nodules persist without growth, will consider waiting another three months, i growth is observed, will consider biopsy. - Continue plan as directed by pulmonology.  - Proceed with follow-up CT scan for February 6th. - Follow up for new chest pain, shortness of breath, cough, or fever.

## 2024-04-01 LAB — CELIAC DISEASE PANEL
Endomysial IgA: NEGATIVE
Immunoglobulin A, (IgA) QN, Serum: 231 mg/dL (ref 87–352)
t-Transglutaminase (tTG) IgA: <2 U/mL (ref 0–3)

## 2024-04-03 ENCOUNTER — Ambulatory Visit (HOSPITAL_COMMUNITY): Payer: Medicare (Managed Care)

## 2024-04-03 DIAGNOSIS — F4323 Adjustment disorder with mixed anxiety and depressed mood: Secondary | ICD-10-CM
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
## 2024-04-03 NOTE — Telephone Encounter (Signed)
 Copied from CRM [id number]. Topic: Clinical - Medical Advice >> Mar 31, 2024  4:54 PM Sophia H wrote: Reason for CRM: Patient states since coming into clinic yesterday she is experiencing diarrhea (yesterday), vomiting (this morning) & she is running hotter than normal but temp has not gone over 99, Normal temp is 97 for her. Patient states she's unsure if she picked something up at the clinic but wants to let everyone know just in case. Declined call back from clinic/nurse triage. TY  Patient states if she feels worse tomorrow she will be going to UC to be tested for Flu/Covid.

## 2024-04-05 NOTE — Telephone Encounter (Signed)
 Blood thinner can make nose bleeds slighlty more common but would not cause them on their own. In general nose bleeds more common in winter due to the drier air which dryes out the sinuses and makes them more likely to bleed. Some patients can have enlarged blood vessels in the nasal cavity that can also bleed. We would not be able to lower the eliquis as lower dose would not be therapeutic, if ongoing nose bleeds sometimes we will hold for 3 days then resume at prior dose, if nose  bleeds keep happening we typically refer to ENT  JINNY Ross MD

## 2024-04-07 ENCOUNTER — Ambulatory Visit: Payer: Self-pay | Admitting: Internal Medicine

## 2024-04-07 ENCOUNTER — Other Ambulatory Visit: Payer: Self-pay | Admitting: Physician Assistant

## 2024-04-07 DIAGNOSIS — Z636 Dependent relative needing care at home: Secondary | ICD-10-CM

## 2024-04-07 MED ORDER — CLONAZEPAM 0.5 MG PO TABS: 0.5000 mg | ORAL_TABLET | Freq: Two times a day (BID) | ORAL | 0 refills | Status: AC | PRN

## 2024-04-14 ENCOUNTER — Ambulatory Visit: Admitting: Allergy & Immunology

## 2024-04-14 ENCOUNTER — Telehealth: Payer: Self-pay | Admitting: Physician Assistant

## 2024-04-14 ENCOUNTER — Encounter: Payer: Self-pay | Admitting: Allergy & Immunology

## 2024-04-14 ENCOUNTER — Ambulatory Visit: Admitting: Family Medicine

## 2024-04-14 NOTE — Telephone Encounter (Signed)
" ° °  Spoke with patient, treatment is being done by Dr[facility] due to her autoimmune system not being in good shape and wanting to get it better. I have copied and pasted the message Dr. Iva sent the patient this morning regarding the IV Infusion. She states she always gets blood work done by Tricia who is the glass blower/designer for [employer] and knows her veins are not good. She does not want to try the first attempt because she knows it will fail and does not want to risk more damage being done.   Hey there!    I would have popped in to see you!!    We can start immunoglobulin infusions - not a problem. We will start that process. We can do the subcutaneous immunoglobulin since you are a hard stick. Some insurances make us  fail the IV infusions first. We will see!    How long have you been on the prophylactic antibiotics again - the azithromycin ? ANY improvement?    Marty Iva, MD [facility]     "

## 2024-04-14 NOTE — Telephone Encounter (Signed)
 Copied from CRM #[id number]. Topic: General - Other >> Apr 14, 2024  2:29 PM Katie Davis wrote: Pt calling to ask for assistance with providing her insurance a letter stating that she would not be a good canidate for IV infusion. She states veins are not strong enough due to her Autoimmune disorder Celiac disease-. Insurance is trying to make the pt try the IV therapy and if it fails then they'll cover other options. However Pt states the lab tech would agree that her veins are not strong enough as they have not been success in previous lab appointment. Pt would rather start with the treatment via the stomach. >> Apr 14, 2024  2:42 PM Katie Davis wrote: Please note the pt is no longer a pt of [facility] She is now part of [facility] but believes the Lab tech there would know her better as she was a long time pt. If [facility] can not help her please let her know and she will reach out to [facility]

## 2024-04-18 ENCOUNTER — Ambulatory Visit (HOSPITAL_COMMUNITY): Payer: Medicare (Managed Care)

## 2024-04-18 ENCOUNTER — Encounter (HOSPITAL_COMMUNITY): Payer: Self-pay

## 2024-04-18 ENCOUNTER — Telehealth: Payer: Self-pay | Admitting: *Deleted

## 2024-04-18 NOTE — Telephone Encounter (Signed)
 L/M for patient to advise submit for Hizentra for SCIG therapy per Dr Iva

## 2024-04-19 ENCOUNTER — Other Ambulatory Visit: Payer: Self-pay | Admitting: Family Medicine

## 2024-04-20 ENCOUNTER — Telehealth: Payer: Self-pay | Admitting: *Deleted

## 2024-04-20 NOTE — Telephone Encounter (Signed)
 Spoke to patient and advised submit for Hizentra to [facility]. I did discuss at length the pricing and possible need for assistance for this medication with patient       Hizentra please (600mg /kg/month equivalent) Katie Marty Saltness, MD to Katie Davis     04/14/24  2:02 PM Hey there!    I would have popped in to see you!!    We can start immunoglobulin infusions - not a problem. We will start that process. We can do the subcutaneous immunoglobulin since you are a hard stick. Some insurances make us  fail the IV infusions first. We will see!    How long have you been on the prophylactic antibiotics again - the azithromycin ? ANY improvement?    Marty Iva, MD [facility] [place]   Last read by Katie Davis at 8:52AM on 04/18/2024.

## 2024-04-21 ENCOUNTER — Ambulatory Visit (HOSPITAL_COMMUNITY): Payer: Medicare (Managed Care)

## 2024-04-21 ENCOUNTER — Ambulatory Visit (HOSPITAL_COMMUNITY): Admission: RE | Admit: 2024-04-21 | Payer: Medicare (Managed Care)

## 2024-04-21 ENCOUNTER — Encounter (HOSPITAL_COMMUNITY): Payer: Self-pay

## 2024-04-21 DIAGNOSIS — R918 Other nonspecific abnormal finding of lung field: Secondary | ICD-10-CM

## 2024-04-21 NOTE — Telephone Encounter (Signed)
Thank you, Tammy!

## 2024-04-25 ENCOUNTER — Ambulatory Visit (HOSPITAL_COMMUNITY): Payer: Medicare (Managed Care)

## 2024-04-26 ENCOUNTER — Ambulatory Visit: Payer: Self-pay

## 2024-04-26 ENCOUNTER — Ambulatory Visit: Admitting: Allergy & Immunology

## 2024-05-03 ENCOUNTER — Ambulatory Visit: Admitting: Allergy & Immunology

## 2024-05-11 ENCOUNTER — Ambulatory Visit

## 2024-05-11 ENCOUNTER — Ambulatory Visit: Admitting: Gastroenterology

## 2024-05-11 ENCOUNTER — Ambulatory Visit: Payer: Medicare (Managed Care) | Admitting: Student

## 2024-05-11 ENCOUNTER — Ambulatory Visit: Admitting: Student

## 2024-06-08 ENCOUNTER — Encounter: Admitting: Physician Assistant

## 2024-06-28 ENCOUNTER — Ambulatory Visit: Payer: Medicare (Managed Care) | Admitting: Physician Assistant
